# Patient Record
Sex: Male | Born: 1945
Health system: Southern US, Community
[De-identification: ages and names within clinical notes are randomized; demographics above are authoritative.]

## PROBLEM LIST (undated history)

## (undated) DIAGNOSIS — M5441 Lumbago with sciatica, right side: Secondary | ICD-10-CM

## (undated) DIAGNOSIS — Z818 Family history of other mental and behavioral disorders: Secondary | ICD-10-CM

## (undated) DIAGNOSIS — Z87442 Personal history of urinary calculi: Secondary | ICD-10-CM

## (undated) DIAGNOSIS — T884XXA Failed or difficult intubation, initial encounter: Secondary | ICD-10-CM

## (undated) DIAGNOSIS — M179 Osteoarthritis of knee, unspecified: Secondary | ICD-10-CM

## (undated) DIAGNOSIS — R011 Cardiac murmur, unspecified: Secondary | ICD-10-CM

## (undated) DIAGNOSIS — Z6835 Body mass index (BMI) 35.0-35.9, adult: Secondary | ICD-10-CM

## (undated) DIAGNOSIS — G5701 Lesion of sciatic nerve, right lower limb: Secondary | ICD-10-CM

## (undated) DIAGNOSIS — E785 Hyperlipidemia, unspecified: Secondary | ICD-10-CM

## (undated) DIAGNOSIS — I209 Angina pectoris, unspecified: Secondary | ICD-10-CM

## (undated) DIAGNOSIS — T8859XA Other complications of anesthesia, initial encounter: Secondary | ICD-10-CM

## (undated) DIAGNOSIS — T4145XA Adverse effect of unspecified anesthetic, initial encounter: Secondary | ICD-10-CM

## (undated) DIAGNOSIS — E663 Overweight: Secondary | ICD-10-CM

## (undated) DIAGNOSIS — M1990 Unspecified osteoarthritis, unspecified site: Secondary | ICD-10-CM

## (undated) DIAGNOSIS — I2510 Atherosclerotic heart disease of native coronary artery without angina pectoris: Secondary | ICD-10-CM

## (undated) DIAGNOSIS — R7301 Impaired fasting glucose: Secondary | ICD-10-CM

## (undated) HISTORY — DX: Body mass index (BMI) 35.0-35.9, adult: Z68.35

## (undated) HISTORY — DX: Hyperlipidemia, unspecified: E78.5

## (undated) HISTORY — DX: Cardiac murmur, unspecified: R01.1

## (undated) HISTORY — DX: Osteoarthritis of knee, unspecified: M17.9

## (undated) HISTORY — PX: JOINT REPLACEMENT: SHX530

## (undated) HISTORY — DX: Lesion of sciatic nerve, right lower limb: G57.01

## (undated) HISTORY — DX: Lumbago with sciatica, right side: M54.41

## (undated) HISTORY — DX: Overweight: E66.3

## (undated) HISTORY — DX: Family history of other mental and behavioral disorders: Z81.8

## (undated) HISTORY — PX: TOTAL KNEE ARTHROPLASTY: SHX125

## (undated) HISTORY — DX: Impaired fasting glucose: R73.01

---

## 2007-10-23 ENCOUNTER — Encounter: Admission: RE | Admit: 2007-10-23 | Discharge: 2007-10-23 | Payer: Self-pay | Admitting: Family Medicine

## 2008-06-05 HISTORY — PX: JOINT REPLACEMENT: SHX530

## 2010-04-06 ENCOUNTER — Inpatient Hospital Stay (HOSPITAL_COMMUNITY): Admission: RE | Admit: 2010-04-06 | Discharge: 2010-04-09 | Payer: Self-pay | Admitting: Orthopedic Surgery

## 2010-04-06 ENCOUNTER — Encounter (INDEPENDENT_AMBULATORY_CARE_PROVIDER_SITE_OTHER): Payer: Self-pay | Admitting: Orthopedic Surgery

## 2010-04-08 ENCOUNTER — Encounter (INDEPENDENT_AMBULATORY_CARE_PROVIDER_SITE_OTHER): Payer: Self-pay | Admitting: Orthopedic Surgery

## 2010-08-16 LAB — LIPID PANEL
LDL Cholesterol: 81 mg/dL (ref 0–99)
Triglycerides: 80 mg/dL (ref <150)
VLDL: 16 mg/dL (ref 0–40)

## 2010-08-16 LAB — PROTIME-INR
INR: 1.44 (ref 0.00–1.49)
INR: 2.26 — ABNORMAL HIGH (ref 0.00–1.49)
Prothrombin Time: 14.8 seconds (ref 11.6–15.2)
Prothrombin Time: 25.1 seconds — ABNORMAL HIGH (ref 11.6–15.2)

## 2010-08-16 LAB — CBC
HCT: 33.5 % — ABNORMAL LOW (ref 39.0–52.0)
HCT: 35.5 % — ABNORMAL LOW (ref 39.0–52.0)
Hemoglobin: 12 g/dL — ABNORMAL LOW (ref 13.0–17.0)
MCHC: 33.4 g/dL (ref 30.0–36.0)
MCHC: 34.2 g/dL (ref 30.0–36.0)
MCHC: 34.3 g/dL (ref 30.0–36.0)
MCV: 86.8 fL (ref 78.0–100.0)
Platelets: 251 10*3/uL (ref 150–400)
Platelets: 274 10*3/uL (ref 150–400)
RBC: 4.09 MIL/uL — ABNORMAL LOW (ref 4.22–5.81)
RDW: 12.8 % (ref 11.5–15.5)
RDW: 13 % (ref 11.5–15.5)
RDW: 13.1 % (ref 11.5–15.5)
WBC: 7 10*3/uL (ref 4.0–10.5)
WBC: 7.5 10*3/uL (ref 4.0–10.5)
WBC: 9.4 10*3/uL (ref 4.0–10.5)

## 2010-08-16 LAB — BASIC METABOLIC PANEL
BUN: 10 mg/dL (ref 6–23)
BUN: 13 mg/dL (ref 6–23)
BUN: 8 mg/dL (ref 6–23)
CO2: 28 mEq/L (ref 19–32)
Calcium: 8.3 mg/dL — ABNORMAL LOW (ref 8.4–10.5)
Chloride: 101 mEq/L (ref 96–112)
Chloride: 95 mEq/L — ABNORMAL LOW (ref 96–112)
Creatinine, Ser: 0.74 mg/dL (ref 0.4–1.5)
Creatinine, Ser: 0.8 mg/dL (ref 0.4–1.5)
GFR calc non Af Amer: >60 mL/min (ref >60)
Glucose, Bld: 137 mg/dL — ABNORMAL HIGH (ref 70–99)
Potassium: 3.6 mEq/L (ref 3.5–5.1)
Sodium: 135 mEq/L (ref 135–145)

## 2010-08-16 LAB — SYNOVIAL CELL COUNT + DIFF, W/ CRYSTALS: Monocyte-Macrophage-Synovial Fluid: 7 % — ABNORMAL LOW (ref 50–90)

## 2010-08-16 LAB — BODY FLUID CULTURE: Culture: NO GROWTH

## 2010-08-16 LAB — CARDIAC PANEL(CRET KIN+CKTOT+MB+TROPI)
CK, MB: 1.1 ng/mL (ref 0.3–4.0)
CK, MB: 2.6 ng/mL (ref 0.3–4.0)
Relative Index: 1.6 (ref 0.0–2.5)
Total CK: 163 U/L (ref 7–232)
Total CK: 43 U/L (ref 7–232)
Troponin I: <0.01 ng/mL (ref 0.00–0.06)
Troponin I: <0.01 ng/mL (ref 0.00–0.06)
Troponin I: <0.01 ng/mL (ref 0.00–0.06)
Troponin I: <0.01 ng/mL (ref 0.00–0.06)

## 2010-08-16 LAB — GRAM STAIN

## 2010-08-16 LAB — PATHOLOGIST SMEAR REVIEW

## 2010-08-17 LAB — PROTIME-INR
INR: 1.01 (ref 0.00–1.49)
Prothrombin Time: 13.5 seconds (ref 11.6–15.2)

## 2010-08-17 LAB — CBC
HCT: 42.4 % (ref 39.0–52.0)
MCH: 29.6 pg (ref 26.0–34.0)
MCHC: 34 g/dL (ref 30.0–36.0)
RDW: 13.1 % (ref 11.5–15.5)

## 2010-08-17 LAB — COMPREHENSIVE METABOLIC PANEL
ALT: 21 U/L (ref 0–53)
Calcium: 9.2 mg/dL (ref 8.4–10.5)
Creatinine, Ser: 0.81 mg/dL (ref 0.4–1.5)
Glucose, Bld: 106 mg/dL — ABNORMAL HIGH (ref 70–99)
Sodium: 137 mEq/L (ref 135–145)
Total Protein: 7.1 g/dL (ref 6.0–8.3)

## 2010-08-17 LAB — URINALYSIS, ROUTINE W REFLEX MICROSCOPIC
Bilirubin Urine: NEGATIVE
Hgb urine dipstick: NEGATIVE
Ketones, ur: NEGATIVE mg/dL
Nitrite: NEGATIVE
Protein, ur: NEGATIVE mg/dL
Urobilinogen, UA: 0.2 mg/dL (ref 0.0–1.0)

## 2010-08-17 LAB — APTT: aPTT: 28 seconds (ref 24–37)

## 2010-08-17 LAB — SURGICAL PCR SCREEN
MRSA, PCR: NEGATIVE
Staphylococcus aureus: NEGATIVE

## 2011-09-14 DIAGNOSIS — Z1211 Encounter for screening for malignant neoplasm of colon: Secondary | ICD-10-CM | POA: Diagnosis not present

## 2011-09-14 DIAGNOSIS — Z136 Encounter for screening for cardiovascular disorders: Secondary | ICD-10-CM | POA: Diagnosis not present

## 2011-09-14 DIAGNOSIS — Z23 Encounter for immunization: Secondary | ICD-10-CM | POA: Diagnosis not present

## 2011-09-14 DIAGNOSIS — Z131 Encounter for screening for diabetes mellitus: Secondary | ICD-10-CM | POA: Diagnosis not present

## 2011-09-14 DIAGNOSIS — Z0000 Encounter for general adult medical examination without abnormal findings: Secondary | ICD-10-CM | POA: Diagnosis not present

## 2011-10-24 DIAGNOSIS — Z1211 Encounter for screening for malignant neoplasm of colon: Secondary | ICD-10-CM | POA: Diagnosis not present

## 2011-10-24 DIAGNOSIS — K648 Other hemorrhoids: Secondary | ICD-10-CM | POA: Diagnosis not present

## 2011-11-08 DIAGNOSIS — K648 Other hemorrhoids: Secondary | ICD-10-CM | POA: Diagnosis not present

## 2012-02-23 DIAGNOSIS — M6530 Trigger finger, unspecified finger: Secondary | ICD-10-CM | POA: Diagnosis not present

## 2012-02-23 DIAGNOSIS — M19049 Primary osteoarthritis, unspecified hand: Secondary | ICD-10-CM | POA: Diagnosis not present

## 2012-03-19 DIAGNOSIS — L821 Other seborrheic keratosis: Secondary | ICD-10-CM | POA: Diagnosis not present

## 2012-03-19 DIAGNOSIS — D239 Other benign neoplasm of skin, unspecified: Secondary | ICD-10-CM | POA: Diagnosis not present

## 2012-03-25 DIAGNOSIS — Z23 Encounter for immunization: Secondary | ICD-10-CM | POA: Diagnosis not present

## 2012-04-16 DIAGNOSIS — H2510 Age-related nuclear cataract, unspecified eye: Secondary | ICD-10-CM | POA: Diagnosis not present

## 2012-10-22 DIAGNOSIS — Z0000 Encounter for general adult medical examination without abnormal findings: Secondary | ICD-10-CM | POA: Diagnosis not present

## 2012-10-22 DIAGNOSIS — E7800 Pure hypercholesterolemia, unspecified: Secondary | ICD-10-CM | POA: Diagnosis not present

## 2012-10-22 DIAGNOSIS — M79609 Pain in unspecified limb: Secondary | ICD-10-CM | POA: Diagnosis not present

## 2012-10-22 DIAGNOSIS — R7301 Impaired fasting glucose: Secondary | ICD-10-CM | POA: Diagnosis not present

## 2012-11-01 DIAGNOSIS — M25559 Pain in unspecified hip: Secondary | ICD-10-CM | POA: Diagnosis not present

## 2012-11-01 DIAGNOSIS — M1710 Unilateral primary osteoarthritis, unspecified knee: Secondary | ICD-10-CM | POA: Diagnosis not present

## 2012-11-01 DIAGNOSIS — M545 Low back pain: Secondary | ICD-10-CM | POA: Diagnosis not present

## 2012-11-21 DIAGNOSIS — M76899 Other specified enthesopathies of unspecified lower limb, excluding foot: Secondary | ICD-10-CM | POA: Diagnosis not present

## 2012-11-21 DIAGNOSIS — M5137 Other intervertebral disc degeneration, lumbosacral region: Secondary | ICD-10-CM | POA: Diagnosis not present

## 2012-12-10 DIAGNOSIS — M76899 Other specified enthesopathies of unspecified lower limb, excluding foot: Secondary | ICD-10-CM | POA: Diagnosis not present

## 2013-02-18 DIAGNOSIS — Z23 Encounter for immunization: Secondary | ICD-10-CM | POA: Diagnosis not present

## 2013-02-21 DIAGNOSIS — Z01810 Encounter for preprocedural cardiovascular examination: Secondary | ICD-10-CM | POA: Diagnosis not present

## 2013-02-21 DIAGNOSIS — Z0389 Encounter for observation for other suspected diseases and conditions ruled out: Secondary | ICD-10-CM | POA: Diagnosis not present

## 2013-02-21 DIAGNOSIS — R9431 Abnormal electrocardiogram [ECG] [EKG]: Secondary | ICD-10-CM | POA: Diagnosis not present

## 2013-02-21 DIAGNOSIS — E663 Overweight: Secondary | ICD-10-CM | POA: Diagnosis not present

## 2013-02-21 DIAGNOSIS — M25569 Pain in unspecified knee: Secondary | ICD-10-CM | POA: Diagnosis not present

## 2013-03-04 ENCOUNTER — Other Ambulatory Visit: Payer: Self-pay | Admitting: Orthopedic Surgery

## 2013-03-04 DIAGNOSIS — M1710 Unilateral primary osteoarthritis, unspecified knee: Secondary | ICD-10-CM | POA: Diagnosis not present

## 2013-03-06 ENCOUNTER — Encounter (HOSPITAL_COMMUNITY): Payer: Self-pay | Admitting: Pharmacy Technician

## 2013-03-10 ENCOUNTER — Encounter (HOSPITAL_COMMUNITY): Payer: Self-pay

## 2013-03-10 ENCOUNTER — Encounter (HOSPITAL_COMMUNITY)
Admission: RE | Admit: 2013-03-10 | Discharge: 2013-03-10 | Disposition: A | Discharge status: Home / Self Care | Payer: Medicare Other | Source: Ambulatory Visit | Attending: Orthopedic Surgery | Admitting: Orthopedic Surgery

## 2013-03-10 DIAGNOSIS — R079 Chest pain, unspecified: Secondary | ICD-10-CM | POA: Diagnosis not present

## 2013-03-10 DIAGNOSIS — Z01812 Encounter for preprocedural laboratory examination: Secondary | ICD-10-CM | POA: Insufficient documentation

## 2013-03-10 DIAGNOSIS — Z01818 Encounter for other preprocedural examination: Secondary | ICD-10-CM | POA: Diagnosis not present

## 2013-03-10 HISTORY — DX: Failed or difficult intubation, initial encounter: T88.4XXA

## 2013-03-10 HISTORY — DX: Other complications of anesthesia, initial encounter: T88.59XA

## 2013-03-10 HISTORY — DX: Unspecified osteoarthritis, unspecified site: M19.90

## 2013-03-10 HISTORY — DX: Adverse effect of unspecified anesthetic, initial encounter: T41.45XA

## 2013-03-10 LAB — URINALYSIS, ROUTINE W REFLEX MICROSCOPIC
Glucose, UA: NEGATIVE mg/dL
Hgb urine dipstick: NEGATIVE
Ketones, ur: NEGATIVE mg/dL
Specific Gravity, Urine: 1.014 (ref 1.005–1.030)
Urobilinogen, UA: 0.2 mg/dL (ref 0.0–1.0)

## 2013-03-10 LAB — COMPREHENSIVE METABOLIC PANEL
ALT: 23 U/L (ref 0–53)
Albumin: 4.3 g/dL (ref 3.5–5.2)
Alkaline Phosphatase: 87 U/L (ref 39–117)
CO2: 26 mEq/L (ref 19–32)
Calcium: 9.5 mg/dL (ref 8.4–10.5)
Creatinine, Ser: 0.76 mg/dL (ref 0.50–1.35)
GFR calc Af Amer: >90 mL/min (ref >90)
GFR calc non Af Amer: >90 mL/min (ref >90)
Glucose, Bld: 110 mg/dL — ABNORMAL HIGH (ref 70–99)
Sodium: 135 mEq/L (ref 135–145)

## 2013-03-10 LAB — CBC WITH DIFFERENTIAL/PLATELET
Eosinophils Relative: 2 % (ref 0–5)
HCT: 42.4 % (ref 39.0–52.0)
Hemoglobin: 15.2 g/dL (ref 13.0–17.0)
Lymphocytes Relative: 33 % (ref 12–46)
Lymphs Abs: 2.2 10*3/uL (ref 0.7–4.0)
MCV: 86.5 fL (ref 78.0–100.0)
Monocytes Absolute: 0.5 10*3/uL (ref 0.1–1.0)
Monocytes Relative: 8 % (ref 3–12)
Neutro Abs: 3.8 10*3/uL (ref 1.7–7.7)
Platelets: 272 10*3/uL (ref 150–400)
RBC: 4.9 MIL/uL (ref 4.22–5.81)
RDW: 13.5 % (ref 11.5–15.5)
WBC: 6.7 10*3/uL (ref 4.0–10.5)

## 2013-03-10 LAB — PROTIME-INR
INR: 1 (ref 0.00–1.49)
Prothrombin Time: 13 seconds (ref 11.6–15.2)

## 2013-03-10 LAB — TYPE AND SCREEN
ABO/RH(D): O POS
Antibody Screen: NEGATIVE

## 2013-03-10 NOTE — Progress Notes (Signed)
Call to [facility]. (Med. Records) For EKG & OV note , left voicemail.  Pt. Reports that he was seen by Dr. Anne Fu a week or so ago,  EKG was done at the same time.

## 2013-03-10 NOTE — Progress Notes (Signed)
Call to A. Zelenak,PAC, reported pt. History of difficult airway. She will review the chart after reading previous anesth. report

## 2013-03-10 NOTE — Pre-Procedure Instructions (Signed)
Isaac [name]  03/10/2013   Your procedure is scheduled on:  03/19/2013  Report to [facility]  2 * 3   ENTRANCE A   at 8:10 AM.  Call this number if you have problems the morning of surgery: [phone]   Remember:   Do not eat food or drink liquids after midnight. On Tuesday   Take these medicines the morning of surgery with A SIP OF WATER: nothing    Do not wear jewelry  Do not wear lotions, powders, or perfumes. You may wear deodorant.   Men may shave face and neck.  Do not bring valuables to the hospital.  [facility] is not responsible                  for any belongings or valuables.               Contacts, dentures or bridgework may not be worn into surgery.  Leave suitcase in the car. After surgery it may be brought to your room.  For patients admitted to the hospital, discharge time is determined by your                treatment team.               Patients discharged the day of surgery will not be allowed to drive  home.  Name and phone number of your driver:/w family  Special Instructions: Shower using CHG 2 nights before surgery and the night before surgery.  If you shower the day of surgery use CHG.  Use special wash - you have one bottle of CHG for all showers.  You should use approximately 1/3 of the bottle for each shower.   Please read over the following fact sheets that you were given: Pain Booklet, Coughing and Deep Breathing, Blood Transfusion Information, MRSA Information and Surgical Site Infection Prevention

## 2013-03-11 ENCOUNTER — Encounter (HOSPITAL_COMMUNITY): Payer: Self-pay

## 2013-03-11 LAB — URINE CULTURE
Colony Count: NO GROWTH
Culture: NO GROWTH

## 2013-03-11 NOTE — Progress Notes (Signed)
Anesthesia Chart Review:  Patient is a 67 year old male scheduled for right TKR on 03/19/13 by Dr. Eulah Pont.  History includes obesity, former smoker, arthritis.  PCP is Dr. Clifton Custard Marrow.  Anesthesia history: DIFFICULT INTUBATION and HTN, tachycardia with ST depression and post-operative negative cardiac work-up 04/2010.  Anesthesia records obtained from 04/06/10 which indicated that there was poor visualization with a Miller 2 and glidescope was ultimately used to place a 7.5 ETT.  He was evaluated by cardiologist Dr. Donato Schultz for a preoperative evaluation on 02/21/13.  EKG then showed NSR, non-specific QRS widening. Dr. Anne Fu had initially seen patient in 04/2010 following his left TKA, as patient had developed significant ST depression in precordial leads, ST @ 136 bpm, and HTN (160/100) during that procedure.  Significant EKG changes resolved after approximately one hour.  He ultimately had a stress test on 05/31/10 that was "overall low risk with no evidence of ischemia with normal ejection fraction.  He also had an abdominal aortic screening on 09/15/11 which showed no evidence of abdominal aortic aneurysm."  Ultimately, Dr. Anne Fu felt patient could proceed with this procedure with low overall cardiac risk.  No further cardiac testing was recommended preoperatively.  Echo on 04/08/10 showed: Left ventricle: The cavity size was normal. Systolic function was vigorous. The estimated ejection fraction was in the range of 65% to 70%. Although no diagnostic regional wall motion abnormality was identified, this possibility cannot be completely excluded on the basis of this study. Left ventricular diastolic function parameters were normal.   CXR on 03/10/13 showed no active cardiopulmonary disease.  Preoperative labs noted.  If no acute changes then I anticipate that he can proceed as planned.  Velna Ochs [facility]/Anesthesiology Phone [phone] 03/11/2013 3:05 PM

## 2013-03-12 ENCOUNTER — Other Ambulatory Visit: Payer: Self-pay | Admitting: Orthopedic Surgery

## 2013-03-12 NOTE — H&P (Signed)
TOTAL KNEE ADMISSION H&P  Patient is being admitted for right total knee arthroplasty.  Subjective:  Chief Complaint:right knee pain.  HPI: Isaac ACCOMANDO, 67 y.o. male, has a history of pain and functional disability in the right knee due to arthritis and has failed non-surgical conservative treatments for greater than 12 weeks to includeNSAID's and/or analgesics, corticosteriod injections, flexibility and strengthening excercises, use of assistive devices and activity modification.  Onset of symptoms was gradual, starting >10 years ago with gradually worsening course since that time. The patient noted no past surgery on the right knee(s).  Patient currently rates pain in the right knee(s) at 10 out of 10 with activity. Patient has night pain, worsening of pain with activity and weight bearing, pain that interferes with activities of daily living, crepitus and joint swelling.  Patient has evidence of subchondral cysts, subchondral sclerosis, periarticular osteophytes and joint space narrowing by imaging studies. There is no active infection.  There are no active problems to display for this patient.  Past Medical History  Diagnosis Date  . Arthritis     R knee- DJD  . Complication of anesthesia     tachycardia with ST depression, HTN witth left TKA 04/2010 (neg cardiac w/u)  . Difficult intubation     glidescope used 04/06/10    Past Surgical History  Procedure Laterality Date  . Joint replacement      L TKA     (Not in a hospital admission) No Known Allergies  History  Substance Use Topics  . Smoking status: Former Smoker    Quit date: 03/10/1998  . Smokeless tobacco: Not on file  . Alcohol Use: Yes     Comment: 3 beers per week    No family history on file.   Current outpatient prescriptions:cholecalciferol (VITAMIN D) 1000 UNITS tablet, Take 1,000 Units by mouth daily., Disp: , Rfl: ;  Docusate Calcium (STOOL SOFTENER PO), Take 1 capsule by mouth daily., Disp: , Rfl: ;   Multiple Vitamin (MULTIVITAMIN WITH MINERALS) TABS tablet, Take 1 tablet by mouth daily., Disp: , Rfl: ;  VITAMIN E PO, Take 1 capsule by mouth daily., Disp: , Rfl:   Review of Systems  Musculoskeletal: Positive for joint pain.  All other systems reviewed and are negative.    Objective:  Physical Exam  Constitutional: He is oriented to person, place, and time. He appears well-developed and well-nourished.  HENT:  Head: Normocephalic and atraumatic.  Eyes: Conjunctivae and EOM are normal. Pupils are equal, round, and reactive to light.  Neck: Neck supple.  Cardiovascular: Normal rate, regular rhythm and normal heart sounds.   Respiratory: Effort normal and breath sounds normal.  GI: Soft. Bowel sounds are normal.  Musculoskeletal:  Examination of his right knee reveals marked grating and crepitus.  5 degrees of varus.  Mild flexion contracture.  Flexion maybe 90 degrees.  Neurovascularly intact distally.    Neurological: He is alert and oriented to person, place, and time.  Skin: Skin is warm and dry.  Psychiatric: He has a normal mood and affect. His behavior is normal.    Vital signs in last 24 hours: Temp: 98.3.  Blood pressure: 134/81.  Pulse: 74.  Labs:   Estimated body mass index is 34.22 kg/(m^2) as calculated from the following:   Height as of 03/10/13: 5\' 8"  (1.727 m).   Weight as of 03/10/13: 102.059 kg (225 lb).   Imaging Review Plain radiographs demonstrate severe degenerative joint disease of the right knee(s). The bone quality  appears to be good for age and reported activity level.  Assessment/Plan:  End stage arthritis, right knee   The patient history, physical examination, clinical judgment of the provider and imaging studies are consistent with end stage degenerative joint disease of the right knee(s) and total knee arthroplasty is deemed medically necessary. The treatment options including medical management, injection therapy arthroscopy and arthroplasty  were discussed at length. The risks and benefits of total knee arthroplasty were presented and reviewed. The risks due to aseptic loosening, infection, stiffness, patella tracking problems, thromboembolic complications and other imponderables were discussed. The patient acknowledged the explanation, agreed to proceed with the plan and consent was signed. Patient is being admitted for inpatient treatment for surgery, pain control, PT, OT, prophylactic antibiotics, VTE prophylaxis, progressive ambulation and ADL's and discharge planning. The patient is planning to be discharged home with home health services

## 2013-03-18 MED ORDER — CEFAZOLIN SODIUM-DEXTROSE 2-3 GM-% IV SOLR
2.0000 g | INTRAVENOUS | Status: AC
  Administered 2013-03-19: 2 g via INTRAVENOUS
  Filled 2013-03-18: qty 50

## 2013-03-18 NOTE — Progress Notes (Signed)
Called, left message on cell phone and spoke with daughter and patient regarding time change, understanding verbalized. Pt was instructed to be here at 0700 on 03/19/13.

## 2013-03-19 ENCOUNTER — Inpatient Hospital Stay (HOSPITAL_COMMUNITY): Payer: Medicare Other | Admitting: Anesthesiology

## 2013-03-19 ENCOUNTER — Inpatient Hospital Stay (HOSPITAL_COMMUNITY): Payer: Medicare Other

## 2013-03-19 ENCOUNTER — Encounter (HOSPITAL_COMMUNITY): Payer: Self-pay | Admitting: Anesthesiology

## 2013-03-19 ENCOUNTER — Encounter (HOSPITAL_COMMUNITY)
Admission: RE | Disposition: A | Discharge status: Home Health | Payer: Self-pay | Source: Ambulatory Visit | Attending: Orthopedic Surgery

## 2013-03-19 ENCOUNTER — Inpatient Hospital Stay (HOSPITAL_COMMUNITY)
Admission: RE | Admit: 2013-03-19 | Discharge: 2013-03-21 | DRG: 470 | Disposition: A | Discharge status: Home Health | Payer: Medicare Other | Source: Ambulatory Visit | Attending: Orthopedic Surgery | Admitting: Orthopedic Surgery

## 2013-03-19 ENCOUNTER — Encounter (HOSPITAL_COMMUNITY): Payer: Medicare Other | Admitting: Vascular Surgery

## 2013-03-19 DIAGNOSIS — G8918 Other acute postprocedural pain: Secondary | ICD-10-CM | POA: Diagnosis not present

## 2013-03-19 DIAGNOSIS — Z96659 Presence of unspecified artificial knee joint: Secondary | ICD-10-CM

## 2013-03-19 DIAGNOSIS — M1711 Unilateral primary osteoarthritis, right knee: Secondary | ICD-10-CM

## 2013-03-19 DIAGNOSIS — M25569 Pain in unspecified knee: Secondary | ICD-10-CM | POA: Diagnosis not present

## 2013-03-19 DIAGNOSIS — Z87891 Personal history of nicotine dependence: Secondary | ICD-10-CM

## 2013-03-19 DIAGNOSIS — Z7982 Long term (current) use of aspirin: Secondary | ICD-10-CM

## 2013-03-19 DIAGNOSIS — M1710 Unilateral primary osteoarthritis, unspecified knee: Secondary | ICD-10-CM | POA: Diagnosis not present

## 2013-03-19 DIAGNOSIS — Z471 Aftercare following joint replacement surgery: Secondary | ICD-10-CM | POA: Diagnosis not present

## 2013-03-19 DIAGNOSIS — Z79899 Other long term (current) drug therapy: Secondary | ICD-10-CM

## 2013-03-19 HISTORY — PX: TOTAL KNEE ARTHROPLASTY: SHX125

## 2013-03-19 SURGERY — ARTHROPLASTY, KNEE, TOTAL
Anesthesia: General | Site: Knee | Laterality: Right | Wound class: Clean

## 2013-03-19 MED ORDER — SODIUM CHLORIDE 0.9 % IJ SOLN
INTRAMUSCULAR | Status: DC | PRN
  Administered 2013-03-19: 40 mL via INTRAVENOUS

## 2013-03-19 MED ORDER — ONDANSETRON HCL 4 MG/2ML IJ SOLN: 4.0000 mg | Freq: Four times a day (QID) | INTRAMUSCULAR | Status: DC | PRN

## 2013-03-19 MED ORDER — BUPIVACAINE LIPOSOME 1.3 % IJ SUSP
20.0000 mL | Freq: Once | INTRAMUSCULAR | Status: AC
  Administered 2013-03-19: 20 mL
  Filled 2013-03-19: qty 20

## 2013-03-19 MED ORDER — METOCLOPRAMIDE HCL 5 MG/ML IJ SOLN: 5.0000 mg | Freq: Three times a day (TID) | INTRAMUSCULAR | Status: DC | PRN

## 2013-03-19 MED ORDER — POTASSIUM CHLORIDE IN NACL 20-0.9 MEQ/L-% IV SOLN
INTRAVENOUS | Status: DC
  Administered 2013-03-19 – 2013-03-20 (×2): via INTRAVENOUS
  Filled 2013-03-19 (×7): qty 1000

## 2013-03-19 MED ORDER — DIPHENHYDRAMINE HCL 12.5 MG/5ML PO ELIX: 12.5000 mg | ORAL_SOLUTION | ORAL | Status: DC | PRN

## 2013-03-19 MED ORDER — HYDROMORPHONE HCL PF 1 MG/ML IJ SOLN
INTRAMUSCULAR | Status: AC
  Filled 2013-03-19: qty 1

## 2013-03-19 MED ORDER — BUPIVACAINE HCL (PF) 0.25 % IJ SOLN
INTRAMUSCULAR | Status: DC | PRN
  Administered 2013-03-19: 15 mL

## 2013-03-19 MED ORDER — METOCLOPRAMIDE HCL 5 MG PO TABS
5.0000 mg | ORAL_TABLET | Freq: Three times a day (TID) | ORAL | Status: DC | PRN
  Filled 2013-03-19: qty 2

## 2013-03-19 MED ORDER — ROCURONIUM BROMIDE 100 MG/10ML IV SOLN
INTRAVENOUS | Status: DC | PRN
  Administered 2013-03-19: 50 mg via INTRAVENOUS

## 2013-03-19 MED ORDER — CHLORHEXIDINE GLUCONATE 4 % EX LIQD: 60.0000 mL | Freq: Once | CUTANEOUS | Status: DC

## 2013-03-19 MED ORDER — METHOCARBAMOL 100 MG/ML IJ SOLN: 500.0000 mg | Freq: Four times a day (QID) | INTRAVENOUS | Status: DC | PRN

## 2013-03-19 MED ORDER — ONDANSETRON HCL 4 MG/2ML IJ SOLN: 4.0000 mg | Freq: Once | INTRAMUSCULAR | Status: DC | PRN

## 2013-03-19 MED ORDER — ACETAMINOPHEN 650 MG RE SUPP: 650.0000 mg | Freq: Four times a day (QID) | RECTAL | Status: DC | PRN

## 2013-03-19 MED ORDER — ONDANSETRON HCL 4 MG PO TABS: 4.0000 mg | ORAL_TABLET | Freq: Four times a day (QID) | ORAL | Status: DC | PRN

## 2013-03-19 MED ORDER — BUPIVACAINE-EPINEPHRINE PF 0.5-1:200000 % IJ SOLN
INTRAMUSCULAR | Status: DC | PRN
  Administered 2013-03-19: 30 mL

## 2013-03-19 MED ORDER — OXYCODONE HCL 5 MG PO TABS
5.0000 mg | ORAL_TABLET | Freq: Once | ORAL | Status: AC | PRN
  Administered 2013-03-19: 5 mg via ORAL

## 2013-03-19 MED ORDER — SUCCINYLCHOLINE CHLORIDE 20 MG/ML IJ SOLN
INTRAMUSCULAR | Status: DC | PRN
  Administered 2013-03-19: 120 mg via INTRAVENOUS

## 2013-03-19 MED ORDER — BUPIVACAINE HCL (PF) 0.25 % IJ SOLN
INTRAMUSCULAR | Status: AC
  Filled 2013-03-19: qty 30

## 2013-03-19 MED ORDER — HYDROMORPHONE HCL PF 1 MG/ML IJ SOLN
INTRAMUSCULAR | Status: AC
  Administered 2013-03-19: 1 mg
  Filled 2013-03-19: qty 1

## 2013-03-19 MED ORDER — LACTATED RINGERS IV SOLN
INTRAVENOUS | Status: DC
  Administered 2013-03-19: 08:00:00 via INTRAVENOUS

## 2013-03-19 MED ORDER — PHENOL 1.4 % MT LIQD
1.0000 | OROMUCOSAL | Status: DC | PRN
  Filled 2013-03-19: qty 177

## 2013-03-19 MED ORDER — ZOLPIDEM TARTRATE 5 MG PO TABS: 5.0000 mg | ORAL_TABLET | Freq: Every evening | ORAL | Status: DC | PRN

## 2013-03-19 MED ORDER — SODIUM CHLORIDE 0.9 % IR SOLN
Status: DC | PRN
  Administered 2013-03-19: 1000 mL

## 2013-03-19 MED ORDER — PROPOFOL 10 MG/ML IV BOLUS
INTRAVENOUS | Status: DC | PRN
  Administered 2013-03-19: 150 mg via INTRAVENOUS

## 2013-03-19 MED ORDER — HYDROMORPHONE HCL PF 1 MG/ML IJ SOLN
0.5000 mg | INTRAMUSCULAR | Status: DC | PRN
  Administered 2013-03-19: 1 mg via INTRAVENOUS
  Administered 2013-03-20: 0.5 mg via INTRAVENOUS
  Administered 2013-03-20 (×2): 1 mg via INTRAVENOUS
  Filled 2013-03-19 (×4): qty 1

## 2013-03-19 MED ORDER — LIDOCAINE HCL (CARDIAC) 20 MG/ML IV SOLN
INTRAVENOUS | Status: DC | PRN
  Administered 2013-03-19: 100 mg via INTRAVENOUS

## 2013-03-19 MED ORDER — ASPIRIN EC 325 MG PO TBEC
325.0000 mg | DELAYED_RELEASE_TABLET | Freq: Every day | ORAL | Status: DC
  Administered 2013-03-20 – 2013-03-21 (×2): 325 mg via ORAL
  Filled 2013-03-19 (×3): qty 1

## 2013-03-19 MED ORDER — HYDROMORPHONE HCL PF 1 MG/ML IJ SOLN
0.2500 mg | INTRAMUSCULAR | Status: DC | PRN
  Administered 2013-03-19 (×4): 0.5 mg via INTRAVENOUS

## 2013-03-19 MED ORDER — MIDAZOLAM HCL 5 MG/5ML IJ SOLN
INTRAMUSCULAR | Status: DC | PRN
  Administered 2013-03-19: 2 mg via INTRAVENOUS

## 2013-03-19 MED ORDER — ARTIFICIAL TEARS OP OINT
TOPICAL_OINTMENT | OPHTHALMIC | Status: DC | PRN
  Administered 2013-03-19: 1 via OPHTHALMIC

## 2013-03-19 MED ORDER — FENTANYL CITRATE 0.05 MG/ML IJ SOLN
INTRAMUSCULAR | Status: DC | PRN
  Administered 2013-03-19 (×8): 50 ug via INTRAVENOUS

## 2013-03-19 MED ORDER — ADULT MULTIVITAMIN W/MINERALS CH
1.0000 | ORAL_TABLET | Freq: Every day | ORAL | Status: DC
  Administered 2013-03-19 – 2013-03-21 (×3): 1 via ORAL
  Filled 2013-03-19 (×3): qty 1

## 2013-03-19 MED ORDER — FENTANYL CITRATE 0.05 MG/ML IJ SOLN
INTRAMUSCULAR | Status: AC
  Administered 2013-03-19: 100 ug
  Filled 2013-03-19: qty 2

## 2013-03-19 MED ORDER — OXYCODONE HCL 5 MG/5ML PO SOLN: 5.0000 mg | Freq: Once | ORAL | Status: AC | PRN

## 2013-03-19 MED ORDER — OXYCODONE HCL 5 MG PO TABS
5.0000 mg | ORAL_TABLET | ORAL | Status: DC | PRN
  Administered 2013-03-19 – 2013-03-21 (×10): 10 mg via ORAL
  Filled 2013-03-19 (×11): qty 2

## 2013-03-19 MED ORDER — ACETAMINOPHEN 325 MG PO TABS: 650.0000 mg | ORAL_TABLET | Freq: Four times a day (QID) | ORAL | Status: DC | PRN

## 2013-03-19 MED ORDER — METHOCARBAMOL 500 MG PO TABS
ORAL_TABLET | ORAL | Status: AC
  Filled 2013-03-19: qty 1

## 2013-03-19 MED ORDER — DOCUSATE SODIUM 100 MG PO CAPS
100.0000 mg | ORAL_CAPSULE | Freq: Two times a day (BID) | ORAL | Status: DC
  Administered 2013-03-19 – 2013-03-21 (×5): 100 mg via ORAL
  Filled 2013-03-19 (×6): qty 1

## 2013-03-19 MED ORDER — MENTHOL 3 MG MT LOZG
1.0000 | LOZENGE | OROMUCOSAL | Status: DC | PRN
  Filled 2013-03-19: qty 9

## 2013-03-19 MED ORDER — METHOCARBAMOL 500 MG PO TABS
500.0000 mg | ORAL_TABLET | Freq: Four times a day (QID) | ORAL | Status: DC | PRN
  Administered 2013-03-19 – 2013-03-21 (×6): 500 mg via ORAL
  Filled 2013-03-19 (×6): qty 1

## 2013-03-19 MED ORDER — MIDAZOLAM HCL 2 MG/2ML IJ SOLN
INTRAMUSCULAR | Status: AC
  Administered 2013-03-19: 2 mg
  Filled 2013-03-19: qty 2

## 2013-03-19 MED ORDER — LACTATED RINGERS IV SOLN: INTRAVENOUS | Status: DC

## 2013-03-19 MED ORDER — CEFAZOLIN SODIUM-DEXTROSE 2-3 GM-% IV SOLR
2.0000 g | Freq: Four times a day (QID) | INTRAVENOUS | Status: AC
  Administered 2013-03-19 (×2): 2 g via INTRAVENOUS
  Filled 2013-03-19 (×2): qty 50

## 2013-03-19 MED ORDER — VITAMIN D3 25 MCG (1000 UNIT) PO TABS
1000.0000 [IU] | ORAL_TABLET | Freq: Every day | ORAL | Status: DC
  Administered 2013-03-19 – 2013-03-21 (×3): 1000 [IU] via ORAL
  Filled 2013-03-19 (×3): qty 1

## 2013-03-19 MED ORDER — LACTATED RINGERS IV SOLN
INTRAVENOUS | Status: DC | PRN
  Administered 2013-03-19 (×3): via INTRAVENOUS

## 2013-03-19 MED ORDER — CELECOXIB 200 MG PO CAPS
200.0000 mg | ORAL_CAPSULE | Freq: Two times a day (BID) | ORAL | Status: DC
  Administered 2013-03-19 – 2013-03-21 (×5): 200 mg via ORAL
  Filled 2013-03-19 (×7): qty 1

## 2013-03-19 MED ORDER — OXYCODONE HCL 5 MG PO TABS
ORAL_TABLET | ORAL | Status: AC
  Filled 2013-03-19: qty 1

## 2013-03-19 MED ORDER — MEPERIDINE HCL 25 MG/ML IJ SOLN: 6.2500 mg | INTRAMUSCULAR | Status: DC | PRN

## 2013-03-19 SURGICAL SUPPLY — 65 items
BANDAGE ESMARK 6X9 LF (GAUZE/BANDAGES/DRESSINGS) ×1 IMPLANT
BENZOIN TINCTURE PRP APPL 2/3 (GAUZE/BANDAGES/DRESSINGS) ×2 IMPLANT
BLADE SAG 18X100X1.27 (BLADE) ×4 IMPLANT
BNDG ESMARK 6X9 LF (GAUZE/BANDAGES/DRESSINGS) ×2
BOWL SMART MIX CTS (DISPOSABLE) ×2 IMPLANT
CEMENT BONE SIMPLEX SPEEDSET (Cement) ×4 IMPLANT
CLOTH BEACON ORANGE TIMEOUT ST (SAFETY) ×2 IMPLANT
COVER SURGICAL LIGHT HANDLE (MISCELLANEOUS) ×2 IMPLANT
CUFF TOURNIQUET SINGLE 34IN LL (TOURNIQUET CUFF) ×2 IMPLANT
DRAPE EXTREMITY T 121X128X90 (DRAPE) ×2 IMPLANT
DRAPE PROXIMA HALF (DRAPES) ×2 IMPLANT
DRAPE U-SHAPE 47X51 STRL (DRAPES) ×2 IMPLANT
DRSG PAD ABDOMINAL 8X10 ST (GAUZE/BANDAGES/DRESSINGS) ×2 IMPLANT
DURAPREP 26ML APPLICATOR (WOUND CARE) ×4 IMPLANT
ELECT CAUTERY BLADE 6.4 (BLADE) ×2 IMPLANT
ELECT REM PT RETURN 9FT ADLT (ELECTROSURGICAL) ×2
ELECTRODE REM PT RTRN 9FT ADLT (ELECTROSURGICAL) ×1 IMPLANT
EVACUATOR 1/8 PVC DRAIN (DRAIN) IMPLANT
FACESHIELD LNG OPTICON STERILE (SAFETY) ×4 IMPLANT
GAUZE XEROFORM 5X9 LF (GAUZE/BANDAGES/DRESSINGS) ×2 IMPLANT
GLOVE BIO SURGEON STRL SZ8 (GLOVE) ×2 IMPLANT
GLOVE BIOGEL PI IND STRL 7.0 (GLOVE) ×5 IMPLANT
GLOVE BIOGEL PI IND STRL 7.5 (GLOVE) ×1 IMPLANT
GLOVE BIOGEL PI INDICATOR 7.0 (GLOVE) ×5
GLOVE BIOGEL PI INDICATOR 7.5 (GLOVE) ×1
GLOVE ECLIPSE 6.5 STRL STRAW (GLOVE) ×4 IMPLANT
GLOVE ORTHO TXT STRL SZ7.5 (GLOVE) ×4 IMPLANT
GLOVE SURG SS PI 7.0 STRL IVOR (GLOVE) ×2 IMPLANT
GOWN PREVENTION PLUS XLARGE (GOWN DISPOSABLE) ×4 IMPLANT
GOWN PREVENTION PLUS XXLARGE (GOWN DISPOSABLE) ×2 IMPLANT
GOWN STRL NON-REIN LRG LVL3 (GOWN DISPOSABLE) ×6 IMPLANT
HANDPIECE INTERPULSE COAX TIP (DISPOSABLE) ×1
IMMOBILIZER KNEE 22 UNIV (SOFTGOODS) IMPLANT
IMMOBILIZER KNEE 24 THIGH 36 (MISCELLANEOUS) ×1 IMPLANT
IMMOBILIZER KNEE 24 UNIV (MISCELLANEOUS) ×2
KIT BASIN OR (CUSTOM PROCEDURE TRAY) ×2 IMPLANT
KIT ROOM TURNOVER OR (KITS) ×2 IMPLANT
KNEE/VIT E POLY LINER LEVEL 1B ×2 IMPLANT
MANIFOLD NEPTUNE II (INSTRUMENTS) ×2 IMPLANT
NEEDLE 18GX1X1/2 (RX/OR ONLY) (NEEDLE) ×2 IMPLANT
NEEDLE HYPO 25GX1X1/2 BEV (NEEDLE) ×2 IMPLANT
NS IRRIG 1000ML POUR BTL (IV SOLUTION) ×2 IMPLANT
PACK TOTAL JOINT (CUSTOM PROCEDURE TRAY) ×2 IMPLANT
PAD ARMBOARD 7.5X6 YLW CONV (MISCELLANEOUS) ×4 IMPLANT
PAD CAST 4YDX4 CTTN HI CHSV (CAST SUPPLIES) ×1 IMPLANT
PADDING CAST COTTON 4X4 STRL (CAST SUPPLIES) ×1
PADDING CAST COTTON 6X4 STRL (CAST SUPPLIES) ×2 IMPLANT
SET HNDPC FAN SPRY TIP SCT (DISPOSABLE) ×1 IMPLANT
SPONGE GAUZE 4X4 12PLY (GAUZE/BANDAGES/DRESSINGS) ×2 IMPLANT
STAPLER VISISTAT 35W (STAPLE) IMPLANT
STRIP CLOSURE SKIN 1/2X4 (GAUZE/BANDAGES/DRESSINGS) ×2 IMPLANT
SUCTION FRAZIER TIP 10 FR DISP (SUCTIONS) ×2 IMPLANT
SUT VIC AB 0 CTX 18 (SUTURE) ×2 IMPLANT
SUT VIC AB 1 CTX 36 (SUTURE) ×1
SUT VIC AB 1 CTX36XBRD ANBCTR (SUTURE) ×1 IMPLANT
SUT VIC AB 2-0 CT1 27 (SUTURE) ×1
SUT VIC AB 2-0 CT1 TAPERPNT 27 (SUTURE) ×1 IMPLANT
SUT VIC AB 3-0 PS1 18 (SUTURE) ×1
SUT VIC AB 3-0 PS1 18XBRD (SUTURE) ×1 IMPLANT
SUT VLOC 180 0 24IN GS25 (SUTURE) ×2 IMPLANT
SYR CONTROL 10ML LL (SYRINGE) ×2 IMPLANT
TOWEL OR 17X24 6PK STRL BLUE (TOWEL DISPOSABLE) ×2 IMPLANT
TOWEL OR 17X26 10 PK STRL BLUE (TOWEL DISPOSABLE) ×2 IMPLANT
TRAY FOLEY CATH 16FRSI W/METER (SET/KITS/TRAYS/PACK) ×2 IMPLANT
WATER STERILE IRR 1000ML POUR (IV SOLUTION) ×4 IMPLANT

## 2013-03-19 NOTE — Interval H&P Note (Signed)
History and Physical Interval Note:  03/19/2013 8:37 AM  Isaac [name]  has presented today for surgery, with the diagnosis of OA RIGHT KNEE  The various methods of treatment have been discussed with the patient and family. After consideration of risks, benefits and other options for treatment, the patient has consented to  Procedure(s): TOTAL KNEE ARTHROPLASTY (Right) as a surgical intervention .  The patient's history has been reviewed, patient examined, no change in status, stable for surgery.  I have reviewed the patient's chart and labs.  Questions were answered to the patient's satisfaction.     [name]

## 2013-03-19 NOTE — Anesthesia Postprocedure Evaluation (Signed)
Anesthesia Post Note  Patient: Isaac [name]  Procedure(s) Performed: Procedure(s) (LRB): TOTAL KNEE ARTHROPLASTY (Right)  Anesthesia type: general  Patient location: PACU  Post pain: Pain level controlled  Post assessment: Patient's Cardiovascular Status Stable  Last Vitals:  Filed Vitals:   03/19/13 1300  BP: 169/86  Pulse: 81  Temp:   Resp: 16    Post vital signs: Reviewed and stable  Level of consciousness: sedated  Complications: No apparent anesthesia complications

## 2013-03-19 NOTE — Anesthesia Preprocedure Evaluation (Signed)
Anesthesia Evaluation  Patient identified by MRN, date of birth, ID band Patient awake    Reviewed: Allergy & Precautions, H&P , NPO status , Patient's Chart, lab work & pertinent test results  History of Anesthesia Complications (+) DIFFICULT AIRWAY  Airway       Dental   Pulmonary          Cardiovascular     Neuro/Psych    GI/Hepatic   Endo/Other    Renal/GU      Musculoskeletal   Abdominal   Peds  Hematology   Anesthesia Other Findings   Reproductive/Obstetrics                           Anesthesia Physical Anesthesia Plan  ASA: II  Anesthesia Plan: General   Post-op Pain Management:    Induction: Intravenous  Airway Management Planned:   Additional Equipment:   Intra-op Plan:   Post-operative Plan:   Informed Consent:   Plan Discussed with:   Anesthesia Plan Comments:         Anesthesia Quick Evaluation

## 2013-03-19 NOTE — Anesthesia Procedure Notes (Addendum)
Anesthesia Regional Block:  Femoral nerve block  Pre-Anesthetic Checklist: ,, timeout performed, Correct Patient, Correct Site, Correct Laterality, Correct Procedure, Correct Position, site marked, Risks and benefits discussed,  Surgical consent,  Pre-op evaluation,  At surgeon's request and post-op pain management  Laterality: Right  Prep: chloraprep       Needles:  Injection technique: Single-shot  Needle Type: Echogenic Stimulator Needle      Needle Gauge: 21 and 21 G    Additional Needles:  Procedures: ultrasound guided (picture in chart) and nerve stimulator Femoral nerve block  Nerve Stimulator or Paresthesia:  Response: 0.4 mA,   Additional Responses:   Narrative:  Start time: 03/19/2013 9:10 AM End time: 03/19/2013 9:20 AM Injection made incrementally with aspirations every 5 mL.  Performed by: Personally  Anesthesiologist: Arta Bruce MD  Additional Notes: Monitors applied. Patient sedated. Sterile prep and drape,hand hygiene and sterile gloves were used. Relevant anatomy identified.Needle position confirmed.Local anesthetic injected incrementally after negative aspiration. Local anesthetic spread visualized around nerve(s). Vascular puncture avoided. No complications. Image printed for medical record.The patient tolerated the procedure well.       Femoral nerve block Procedure Name: Intubation Date/Time: 03/19/2013 9:41 AM Performed by: Cathie Olden B Pre-anesthesia Checklist: Timeout performed, Patient identified, Emergency Drugs available, Suction available and Patient being monitored Patient Re-evaluated:Patient Re-evaluated prior to inductionOxygen Delivery Method: Circle system utilized Preoxygenation: Pre-oxygenation with 100% oxygen Intubation Type: IV induction Ventilation: Mask ventilation without difficulty Laryngoscope Size: Mac and 4 Grade View: Grade II Tube size: 7.5 mm Number of attempts: 1 (Dr. Michelle Piper performs DL and intubation, good  view of epiglottis and cricoid pressure given posterior arytenoids identified and tube placed) Airway Equipment and Method: Stylet Placement Confirmation: ETT inserted through vocal cords under direct vision,  positive ETCO2 and breath sounds checked- equal and bilateral Secured at: 22 cm Tube secured with: Tape Dental Injury: Teeth and Oropharynx as per pre-operative assessment  Difficulty Due To: Difficult Airway- due to anterior larynx Future Recommendations: Recommend- induction with short-acting agent, and alternative techniques readily available

## 2013-03-19 NOTE — H&P (View-Only) (Signed)
TOTAL KNEE ADMISSION H&P  Patient is being admitted for right total knee arthroplasty.  Subjective:  Chief Complaint:right knee pain.  HPI: Isaac [name], 66 y.o. male, has a history of pain and functional disability in the right knee due to arthritis and has failed non-surgical conservative treatments for greater than 12 weeks to includeNSAID's and/or analgesics, corticosteriod injections, flexibility and strengthening excercises, use of assistive devices and activity modification.  Onset of symptoms was gradual, starting >10 years ago with gradually worsening course since that time. The patient noted no past surgery on the right knee(s).  Patient currently rates pain in the right knee(s) at 10 out of 10 with activity. Patient has night pain, worsening of pain with activity and weight bearing, pain that interferes with activities of daily living, crepitus and joint swelling.  Patient has evidence of subchondral cysts, subchondral sclerosis, periarticular osteophytes and joint space narrowing by imaging studies. There is no active infection.  There are no active problems to display for this patient.  Past Medical History  Diagnosis Date  . Arthritis     R knee- DJD  . Complication of anesthesia     tachycardia with ST depression, HTN witth left TKA 04/2010 (neg cardiac w/u)  . Difficult intubation     glidescope used 04/06/10    Past Surgical History  Procedure Laterality Date  . Joint replacement      L TKA     (Not in a hospital admission) No Known Allergies  History  Substance Use Topics  . Smoking status: Former Smoker    Quit date: 03/10/1998  . Smokeless tobacco: Not on file  . Alcohol Use: Yes     Comment: 3 beers per week    No family history on file.   Current outpatient prescriptions:cholecalciferol (VITAMIN D) 1000 UNITS tablet, Take 1,000 Units by mouth daily., Disp: , Rfl: ;  Docusate Calcium (STOOL SOFTENER PO), Take 1 capsule by mouth daily., Disp: , Rfl: ;   Multiple Vitamin (MULTIVITAMIN WITH MINERALS) TABS tablet, Take 1 tablet by mouth daily., Disp: , Rfl: ;  VITAMIN E PO, Take 1 capsule by mouth daily., Disp: , Rfl:   Review of Systems  Musculoskeletal: Positive for joint pain.  All other systems reviewed and are negative.    Objective:  Physical Exam  Constitutional: He is oriented to person, place, and time. He appears well-developed and well-nourished.  HENT:  Head: Normocephalic and atraumatic.  Eyes: Conjunctivae and EOM are normal. Pupils are equal, round, and reactive to light.  Neck: Neck supple.  Cardiovascular: Normal rate, regular rhythm and normal heart sounds.   Respiratory: Effort normal and breath sounds normal.  GI: Soft. Bowel sounds are normal.  Musculoskeletal:  Examination of his right knee reveals marked grating and crepitus.  5 degrees of varus.  Mild flexion contracture.  Flexion maybe 90 degrees.  Neurovascularly intact distally.    Neurological: He is alert and oriented to person, place, and time.  Skin: Skin is warm and dry.  Psychiatric: He has a normal mood and affect. His behavior is normal.    Vital signs in last 24 hours: Temp: 98.3.  Blood pressure: 134/81.  Pulse: 74.  Labs:   Estimated body mass index is 34.22 kg/(m^2) as calculated from the following:   Height as of 03/10/13: 5' 8" (1.727 m).   Weight as of 03/10/13: 102.059 kg (225 lb).   Imaging Review Plain radiographs demonstrate severe degenerative joint disease of the right knee(s). The bone quality   appears to be good for age and reported activity level.  Assessment/Plan:  End stage arthritis, right knee   The patient history, physical examination, clinical judgment of the provider and imaging studies are consistent with end stage degenerative joint disease of the right knee(s) and total knee arthroplasty is deemed medically necessary. The treatment options including medical management, injection therapy arthroscopy and arthroplasty  were discussed at length. The risks and benefits of total knee arthroplasty were presented and reviewed. The risks due to aseptic loosening, infection, stiffness, patella tracking problems, thromboembolic complications and other imponderables were discussed. The patient acknowledged the explanation, agreed to proceed with the plan and consent was signed. Patient is being admitted for inpatient treatment for surgery, pain control, PT, OT, prophylactic antibiotics, VTE prophylaxis, progressive ambulation and ADL's and discharge planning. The patient is planning to be discharged home with home health services   

## 2013-03-19 NOTE — Progress Notes (Signed)
Utilization Review Completed.Isaac [name] T10/15/2014

## 2013-03-19 NOTE — Transfer of Care (Signed)
Immediate Anesthesia Transfer of Care Note  Patient: Isaac [name]  Procedure(s) Performed: Procedure(s): TOTAL KNEE ARTHROPLASTY (Right)  Patient Location: PACU  Anesthesia Type:General  Level of Consciousness: awake, oriented and patient cooperative  Airway & Oxygen Therapy: Patient Spontanous Breathing 10 liters Face mask O2  Post-op Assessment: Report given to PACU RN, Post -op Vital signs reviewed and stable and Patient moving all extremities X 4  Post vital signs: Reviewed and stable  Complications: No apparent anesthesia complications

## 2013-03-19 NOTE — Progress Notes (Signed)
Orthopedic Tech Progress Note Patient Details:  Isaac [name] 01-24-1946 [id number]  CPM Right Knee CPM Right Knee: On Right Knee Flexion (Degrees): 60 Right Knee Extension (Degrees): 0 Additional Comments: Trapeze bar   Cammer, Mickie Bail 03/19/2013, 2:04 PM

## 2013-03-19 NOTE — Progress Notes (Signed)
Orthopedic Tech Progress Note Patient Details:  Isaac [name] [birth year] [id number] On cpm at 9:00 pm RLE 0-60 Patient ID: Isaac [name], male   DOB: 1946/01/07, 67 y.o.   MRN: [id number]   Isaac [name] 03/19/2013, 9:11 PM

## 2013-03-20 LAB — CBC
HCT: 32.7 % — ABNORMAL LOW (ref 39.0–52.0)
MCH: 29.9 pg (ref 26.0–34.0)
MCV: 87.2 fL (ref 78.0–100.0)
RBC: 3.75 MIL/uL — ABNORMAL LOW (ref 4.22–5.81)
RDW: 13.8 % (ref 11.5–15.5)
WBC: 7.5 10*3/uL (ref 4.0–10.5)

## 2013-03-20 LAB — BASIC METABOLIC PANEL
BUN: 12 mg/dL (ref 6–23)
CO2: 25 mEq/L (ref 19–32)
Calcium: 7.7 mg/dL — ABNORMAL LOW (ref 8.4–10.5)
Chloride: 97 mEq/L (ref 96–112)
Creatinine, Ser: 0.78 mg/dL (ref 0.50–1.35)

## 2013-03-20 MED ORDER — WHITE PETROLATUM GEL
Status: AC
  Administered 2013-03-20: 0.2
  Filled 2013-03-20: qty 5

## 2013-03-20 NOTE — Progress Notes (Signed)
Physical Therapy Treatment Patient Details Name: Isaac [name] MRN: [id number] DOB: 03-Aug-1945 Today's Date: 03/20/2013 Time: 4098-1191 PT Time Calculation (min): 26 min  PT Assessment / Plan / Recommendation  History of Present Illness Pt s/p rt TKA.   PT Comments   Pt progressing well with therapy.  Practiced steps this session.    Follow Up Recommendations   Home health PT; supervision - intermittent     Does the patient have the potential to tolerate intense rehabilitation     Barriers to Discharge       Equipment Recommendations    None recommended by PT   Recommendations for Other Services    Frequency   7x/week  Progress towards PT Goals    Plan      Precautions / Restrictions Precautions Precautions: Knee Required Braces or Orthoses: Knee Immobilizer - Right Knee Immobilizer - Right: On when out of bed or walking Restrictions Weight Bearing Restrictions: Yes RLE Weight Bearing: Weight bearing as tolerated   Pertinent Vitals/Pain 6/10 at rest.  8/10 with activity.  Repositioned for comfort.  RN getting ready to administer pain meds.     Mobility  Bed Mobility Bed Mobility: Supine to Sit;Sitting - Scoot to Edge of Bed;Sit to Supine Supine to Sit: 5: Supervision;HOB flat Sitting - Scoot to Edge of Bed: 5: Supervision Sit to Supine: 5: Supervision;HOB flat Transfers Transfers: Sit to Stand;Stand to Sit Sit to Stand: 4: Min guard;With upper extremity assist;From bed;From toilet Stand to Sit: 4: Min guard;With upper extremity assist;To bed;To toilet Details for Transfer Assistance: cues to reinforce safe hand placement Ambulation/Gait Ambulation/Gait Assistance: 4: Min guard Ambulation Distance (Feet): 140 Feet Assistive device: Rolling walker Ambulation/Gait Assistance Details: Cues to slow down, encouragement to decrease reliance of UE's on RW.   Trialed ambulation without KI due to pt able to perform 10 SLR's independently without ext lag.  No knee  buckling noted.   Gait Pattern: Step-through pattern;Decreased step length - left;Decreased weight shift to right Stairs: Yes Stairs Assistance: 4: Min guard Stairs Assistance Details (indicate cue type and reason): (A) for balance with 1st step due to pt hesitant & Rt knee slightly buckling.  No buckling noted after initial step.  Cues for sequencing & technique.  Performed using bil rails due to pt states he can hold onto doorframe at home to enter.   Stair Management Technique: Two rails;Step to pattern;Forwards Number of Stairs: 2 (2x's) Wheelchair Mobility Wheelchair Mobility: No    Exercises Total Joint Exercises Ankle Circles/Pumps: AROM;Both;10 reps Quad Sets: AROM;Both;10 reps Hip ABduction/ADduction: AROM;Strengthening;Right;10 reps;Supine Straight Leg Raises: AROM;Strengthening;Right;10 reps;Supine     PT Goals (current goals can now be found in the care plan section) Acute Rehab PT Goals Patient Stated Goal: Return home PT Goal Formulation: With patient Time For Goal Achievement: 03/22/13 Potential to Achieve Goals: Good  Visit Information  Last PT Received On: 03/20/13 Assistance Needed: +1 History of Present Illness: Pt s/p rt TKA.    Subjective Data  Patient Stated Goal: Return home   Cognition  Cognition Arousal/Alertness: Awake/alert Behavior During Therapy: WFL for tasks assessed/performed Overall Cognitive Status: Within Functional Limits for tasks assessed    Balance     End of Session     GP     Lara Mulch 03/20/2013, 2:13 PM  Verdell Face, PTA [phone] 03/20/2013

## 2013-03-20 NOTE — Op Note (Signed)
[name], [name] NO.:  [id number]  MEDICAL RECORD NO.:  [id number]  LOCATION:  5N32C                        FACILITY:  [facility]  PHYSICIAN:  Loreta Ave, M.D. DATE OF BIRTH:  11/29/1945  DATE OF PROCEDURE:  03/19/2013 DATE OF DISCHARGE:                              OPERATIVE REPORT   PREOPERATIVE DIAGNOSIS:  Right knee end-stage degenerative arthritis, tricompartmental.  POSTOPERATIVE DIAGNOSIS:  Right knee end-stage degenerative arthritis, tricompartmental.  PROCEDURE:  Modified minimally invasive right total knee replacement, Stryker triathlon prosthesis.  Soft tissue balancing.  Cemented pegged posterior stabilized #5.  Femoral component cemented 6.  Tibial component, 9 mm polyethylene insert.  Cemented resurfacing 38-mm patellar component.  SURGEON:  Loreta Ave, M.D.  ASSISTANT:  Domingo Cocking, PA.  ANESTHESIA:  General.  ESTIMATED BLOOD LOSS:  Minimal.  SPECIMENS:  None.  COMPLICATION:  None.  DRESSINGS:  Soft compressive knee immobilizer.  TOURNIQUET TIME:  1 hour and 10 minutes.  DRAINS:  Hemovac x1.  PROCEDURE IN DETAIL:  The patient was brought to the operating room, placed on the operating table in supine position.  After adequate anesthesia had been obtained, knee examined.  Fairly good extension, flexion better than 110.  Alignment neutral.  Tourniquet applied. Prepped and draped in usual sterile fashion.  Exsanguinated with elevation of Esmarch.  Tourniquet inflated at 350 mmHg.  Straight incision above the patella down the tibial tubercle.  Skin and subcu tissue was divided.  Hemostasis with cautery.  Medial arthrotomy, vastus splitting, and preserving quad tendon.  Knee exposed.  Grade 4 change throughout.  Remnants of menisci, cruciate ligaments, periarticular spurs, loose bodies removed.  Intramedullary guide distal femur. Flexible rod.  An 8-mm resection, 5 degrees of valgus.  Using epicondylar axis, the femur  was sized, cut, and fitted for a pegged posterior stabilized #5 component.  Proximal tibial resection extramedullary guide.  A 3-degree posterior slope cut.  Size #6 component.  Nicely balanced in flexion extension.  Debris cleared throughout the knee.  Patella exposed.  Posterior 10 mm removed. Drilled, sized, and fitted for a 38-mm component.  After completely irrigating the knee trials put in place.  With the 9 mm insert, I am very pleased with stability motion as well as patellar tracking.  Tibia was marked for rotation and hand reamed.  All trials removed.  Copious irrigation with a pulse irrigating device.  Cement prepared and placed on all components, firmly seated.  Polyethylene attached to tibia and knee reduced.  Patella held with a clamp.  Once cement hardened, the knee was irrigated again.  Soft tissues was injected with Exparel. Hemovac placed.  Arthrotomy closed with Vicryl subcutaneous and subcuticular closure as well.  Sterile compressive dressing was applied. Tourniquet deflated and removed.  Knee immobilizer applied.  Anesthesia reversed.  Brought to the recovery room.  Tolerated the surgery well with no complications.     Loreta Ave, M.D.     DFM/MEDQ  D:  03/19/2013  T:  03/20/2013  Job:  [id number]

## 2013-03-20 NOTE — Progress Notes (Signed)
   CARE MANAGEMENT NOTE 03/20/2013  Patient:  Isaac [name], Isaac [name]   Account Number:  [id number]  Date Initiated:  03/20/2013  Documentation initiated by:  [facility]  Subjective/Objective Assessment:   rt TKA     Action/Plan:   HH, lives at home with wife   Anticipated DC Date:  03/21/2013   Anticipated DC Plan:  HOME W HOME HEALTH SERVICES      DC Planning Services  CM consult      [facility] Choice  HOME HEALTH   Choice offered to / List presented to:  C-1 Patient   DME arranged  CPM      DME agency  [employer]     HH arranged  HH-2 PT  HH-3 OT      HH agency  [facility]   Status of service:  Completed, signed off Medicare Important Message given?   (If response is "NO", the following Medicare IM given date fields will be blank) Date Medicare IM given:   Date Additional Medicare IM given:    Discharge Disposition:  HOME W HOME HEALTH SERVICES  Per UR Regulation:    If discussed at Long Length of Stay Meetings, dates discussed:    Comments:  03/20/2013 1740 NCM spoke to pt and he will have [facility] for [facility]. Notified [facility] of pt's scheduled dc home tomorrow. Pt has CPM from [employer]. Has DME (RW and 3n1) from previous surgery. Isidoro Donning RN CCM Case Mgmt phone [phone]

## 2013-03-20 NOTE — Progress Notes (Signed)
Patient ID: Isaac [name], male   DOB: 04-20-1946, 67 y.o.   MRN: [id number]  PROGRESS NOTE  Subjective:  negative for Chest Pain  negative for Shortness of Breath  negative for Nausea/Vomiting   negative for Calf Pain  negative for Bowel Movement   Tolerating Diet: yes         Patient reports pain as 8 on 0-10 scale.       Objective: Vital signs in last 24 hours:   Patient Vitals for the past 24 hrs:  BP Temp Temp src Pulse Resp SpO2  03/20/13 0533 114/66 mmHg 97.9 F (36.6 C) Oral 89 18 98 %  03/20/13 0157 132/66 mmHg 98.8 F (37.1 C) Oral 86 19 98 %  03/19/13 2141 132/71 mmHg 98.9 F (37.2 C) Oral 91 20 97 %  03/19/13 1600 - - - - 20 96 %  03/19/13 1450 109/51 mmHg 99.7 F (37.6 C) Oral 97 18 98 %  03/19/13 1415 - 97.8 F (36.6 C) - - - -  03/19/13 1400 125/66 mmHg - - 68 20 99 %  03/19/13 1330 132/74 mmHg - - 74 10 98 %  03/19/13 1300 169/86 mmHg - - 81 16 99 %  03/19/13 1245 171/94 mmHg - - 83 16 100 %  03/19/13 1241 - 97.2 F (36.2 C) - - - -  03/19/13 0923 - - - 69 12 98 %  03/19/13 0922 - - - 69 15 98 %  03/19/13 0921 138/56 mmHg - - 68 15 98 %  03/19/13 0920 138/56 mmHg - - 70 16 99 %  03/19/13 0919 - - - 67 11 99 %  03/19/13 0917 - - - 66 15 98 %  03/19/13 0915 - - - 70 15 99 %  03/19/13 0910 - - - 68 15 98 %  03/19/13 0905 - - - 63 12 99 %  03/19/13 0900 - - - 65 19 100 %  03/19/13 0855 - - - 65 10 99 %  03/19/13 0845 - - - 65 18 99 %  03/19/13 0840 - - - 66 14 99 %  03/19/13 0838 - - - 66 18 99 %  03/19/13 0830 142/77 mmHg - - 67 14 99 %  03/19/13 0827 - - - 64 10 100 %  03/19/13 0825 - - - 66 12 99 %  03/19/13 0824 143/82 mmHg - - 64 18 -  03/19/13 0823 - - - 64 11 98 %  03/19/13 0822 143/82 mmHg - - 64 18 99 %  03/19/13 0732 124/70 mmHg 97.3 F (36.3 C) Oral 65 20 96 %      Intake/Output from previous day:   10/15 0701 - 10/16 0700 In: 2950 [I.V.:2400] Out: 2230 [Urine:2050; Drains:80]   Intake/Output this shift:   10/15 1901 - 10/16  0700 In: -  Out: 1630 [Urine:1550; Drains:80]   Intake/Output     10/15 0701 - 10/16 0700   I.V. 2400   Other 500   IV Piggyback 50   Total Intake 2950   Urine 2050   Drains 80   Blood 100   Total Output 2230   Net +720          LABORATORY DATA:  Recent Labs  03/20/13 0415  WBC 7.5  HGB 11.2*  HCT 32.7*  PLT 220    Recent Labs  03/20/13 0415  NA 130*  K 4.0  CL 97  CO2 25  BUN 12  CREATININE 0.78  GLUCOSE 132*  CALCIUM 7.7*   Lab Results  Component Value Date   INR 1.00 03/10/2013   INR 2.26* 04/09/2010   INR 1.44 04/08/2010    Examination:  General appearance: alert, cooperative and no distress  Wound Exam: dressing in place  Drainage:  No drainage noted through dressing. Hemovac removed w/o comps  Motor Exam: grossly intact bilateral LE  Sensory Exam: grossly intact bilateral LE  Vascular Exam: Normal  Assessment:    1 Day Post-Op  Procedure(s) (LRB): TOTAL KNEE ARTHROPLASTY (Right)  ADDITIONAL DIAGNOSIS:  Active Problems:   * No active hospital problems. *     Plan: Physical Therapy as ordered Weight Bearing as Tolerated (WBAT)  DVT Prophylaxis:  Aspirin, Foot Pumps and TED hose  DISCHARGE PLAN: Home  DISCHARGE NEEDS: HHPT and CPM, DME rec per PT/OT         Zaylen Susman JAMES 03/20/2013, 6:51 AM

## 2013-03-20 NOTE — Evaluation (Signed)
Occupational Therapy Evaluation and Discharge Summary Patient Details Name: Isaac [name] MRN: [id number] DOB: [birth year] Today's Date: 03/20/2013 Time: 4098-1191 OT Time Calculation (min): 17 min  OT Assessment / Plan / Recommendation History of present illness Pt s/p rt TKA.   Clinical Impression   Pt admitted with above diagnosis.  Pt had TKA 3 years ago. All techniques reviewed per pt's granddaughter's request and pt overall does well with adls.  Pt has assist at home.    OT Assessment  Patient does not need any further OT services    Follow Up Recommendations  No OT follow up    Barriers to Discharge      Equipment Recommendations  None recommended by OT    Recommendations for Other Services    Frequency       Precautions / Restrictions Precautions Precautions: Knee Required Braces or Orthoses: Knee Immobilizer - Right Knee Immobilizer - Right: On when out of bed or walking Restrictions Weight Bearing Restrictions: Yes RLE Weight Bearing: Weight bearing as tolerated   Pertinent Vitals/Pain Pt has 7/10 pain in knee.  Nursing aware and pt to get meds at 10:50.    ADL  Eating/Feeding: Performed;Independent Where Assessed - Eating/Feeding: Chair Grooming: Performed;Wash/dry face;Wash/dry hands;Supervision/safety Where Assessed - Grooming: Unsupported standing Upper Body Bathing: Set up;Simulated Where Assessed - Upper Body Bathing: Unsupported sitting Lower Body Bathing: Simulated;Minimal assistance Where Assessed - Lower Body Bathing: Unsupported sit to stand Upper Body Dressing: Simulated;Set up Where Assessed - Upper Body Dressing: Unsupported sitting Lower Body Dressing: Performed;Moderate assistance Where Assessed - Lower Body Dressing: Supported sit to stand Toilet Transfer: Research scientist (life sciences) Method: Other (comment) (ambulated) Acupuncturist: Comfort height toilet Toileting - Clothing Manipulation and Hygiene:  Performed;Supervision/safety Where Assessed - Engineer, mining and Hygiene: Standing Equipment Used: Rolling walker;Knee Immobilizer Transfers/Ambulation Related to ADLs: Pt walked in room and performed all transfers with S. Pt seems to have good carry over since last surgery. ADL Comments: Pt only needs assist with LE adls and will get assist from family.  ADL techniques reviewed and pt familiar from knee replacment 3 years ago.    OT Diagnosis:    OT Problem List:   OT Treatment Interventions:     OT Goals(Current goals can be found in the care plan section) Acute Rehab OT Goals Patient Stated Goal: Return home  Visit Information  Last OT Received On: 03/20/13 Assistance Needed: +1 History of Present Illness: Pt s/p rt TKA.       Prior Functioning     Home Living Family/patient expects to be discharged to:: Private residence Living Arrangements: Children Available Help at Discharge: Family;Available PRN/intermittently Type of Home: House Home Access: Stairs to enter [employer] of Steps: 2 Entrance Stairs-Rails: Right Home Layout: One level Home Equipment: Walker - 2 wheels;Bedside commode;Other (comment) Prior Function Level of Independence: Independent Communication Communication: No difficulties Dominant Hand: Right         Vision/Perception     Cognition  Cognition Arousal/Alertness: Awake/alert Behavior During Therapy: WFL for tasks assessed/performed Overall Cognitive Status: Within Functional Limits for tasks assessed    Extremity/Trunk Assessment Upper Extremity Assessment Upper Extremity Assessment: Overall WFL for tasks assessed Lower Extremity Assessment Lower Extremity Assessment: Defer to PT evaluation RLE Deficits / Details: Fair quad set.  AAROM knee 5-80 degrees in sitting. Cervical / Trunk Assessment Cervical / Trunk Assessment: Normal     Mobility Bed Mobility Bed Mobility: Sit to Supine Sit to Supine: 4:  Min guard Transfers  Transfers: Sit to Stand;Stand to Sit Sit to Stand: 4: Min guard;From chair/3-in-1 Stand to Sit: 4: Min guard;With upper extremity assist;With armrests;To chair/3-in-1;To toilet Details for Transfer Assistance: Cues for safety to reach back to bed before sitting.     Exercise Total Joint Exercises Quad Sets: AROM;Both;10 reps;Seated Knee Flexion: AAROM;Right;5 reps;Seated Goniometric ROM: 5-80 degrees   Balance Balance Balance Assessed: Yes Static Standing Balance Static Standing - Balance Support: Bilateral upper extremity supported Static Standing - Level of Assistance: 5: Stand by assistance   End of Session OT - End of Session Equipment Utilized During Treatment: Rolling walker;Right knee immobilizer Activity Tolerance: Patient tolerated treatment well Patient left: in bed;in CPM;with call bell/phone within reach;with family/visitor present Nurse Communication: Mobility status CPM Right Knee CPM Right Knee: On Right Knee Flexion (Degrees): 60 Right Knee Extension (Degrees): 0 Additional Comments: Trapeze bar  GO     Isaac [name] 03/20/2013, 10:32 AM [phone]

## 2013-03-20 NOTE — Evaluation (Signed)
Physical Therapy Evaluation Patient Details Name: Isaac [name] MRN: [id number] DOB: April 05, 1946 Today's Date: 03/20/2013 Time: 7829-5621 PT Time Calculation (min): 25 min  PT Assessment / Plan / Recommendation History of Present Illness  Pt s/p rt TKA.  Clinical Impression  Pt is s/p TKA resulting in the deficits listed below (see PT Problem List).  Pt will benefit from skilled PT to increase their independence and safety with mobility to allow discharge to the venue listed below.      PT Assessment  Patient needs continued PT services    Follow Up Recommendations  Home health PT;Supervision - Intermittent    Does the patient have the potential to tolerate intense rehabilitation      Barriers to Discharge        Equipment Recommendations  None recommended by PT    Recommendations for Other Services     Frequency 7X/week    Precautions / Restrictions Precautions Precautions: Knee Required Braces or Orthoses: Knee Immobilizer - Right Knee Immobilizer - Right: On when out of bed or walking   Pertinent Vitals/Pain Rt knee surgical pain.      Mobility  Transfers Transfers: Sit to Stand;Stand to Sit Sit to Stand: 4: Min guard;With upper extremity assist;With armrests;From chair/3-in-1;From toilet Stand to Sit: 4: Min guard;With upper extremity assist;With armrests;To chair/3-in-1;To toilet Ambulation/Gait Ambulation/Gait Assistance: 5: Supervision Ambulation Distance (Feet): 150 Feet Assistive device: Rolling walker Ambulation/Gait Assistance Details: Several standing breaks to rest arms.  Gait Pattern: Step-through pattern;Decreased step length - left;Decreased stance time - right Gait velocity: decr    Exercises Total Joint Exercises Quad Sets: AROM;Both;10 reps;Seated Knee Flexion: AAROM;Right;5 reps;Seated Goniometric ROM: 5-80 degrees   PT Diagnosis: Difficulty walking  PT Problem List: Decreased strength;Decreased range of motion;Decreased mobility PT  Treatment Interventions: DME instruction;Gait training;Stair training;Functional mobility training;Therapeutic activities;Therapeutic exercise;Patient/family education     PT Goals(Current goals can be found in the care plan section) Acute Rehab PT Goals Patient Stated Goal: Return home PT Goal Formulation: With patient Time For Goal Achievement: 03/22/13 Potential to Achieve Goals: Good  Visit Information  Last PT Received On: 03/20/13 Assistance Needed: +1 History of Present Illness: Pt s/p rt TKA.       Prior Functioning  Home Living Family/patient expects to be discharged to:: Private residence Living Arrangements: Children Available Help at Discharge: Family;Available PRN/intermittently Type of Home: House Home Access: Stairs to enter [employer] of Steps: 2 Entrance Stairs-Rails: Right Home Layout: One level Home Equipment: Walker - 2 wheels;Bedside commode;Other (comment) (CPM) Prior Function Level of Independence: Independent Communication Communication: No difficulties    Cognition  Cognition Arousal/Alertness: Awake/alert Behavior During Therapy: WFL for tasks assessed/performed Overall Cognitive Status: Within Functional Limits for tasks assessed    Extremity/Trunk Assessment Upper Extremity Assessment Upper Extremity Assessment: Overall WFL for tasks assessed Lower Extremity Assessment Lower Extremity Assessment: RLE deficits/detail RLE Deficits / Details: Fair quad set.  AAROM knee 5-80 degrees in sitting.   Balance Balance Balance Assessed: Yes Static Standing Balance Static Standing - Balance Support: Bilateral upper extremity supported Static Standing - Level of Assistance: 5: Stand by assistance  End of Session PT - End of Session Equipment Utilized During Treatment: Gait belt Activity Tolerance: Patient tolerated treatment well Patient left: in chair;with call bell/phone within reach;with family/visitor present Nurse Communication:  Mobility status  GP     Maudine Kluesner 03/20/2013, 10:13 AM  [employer] PT [phone]

## 2013-03-21 ENCOUNTER — Encounter (HOSPITAL_COMMUNITY): Payer: Self-pay | Admitting: Orthopedic Surgery

## 2013-03-21 LAB — BASIC METABOLIC PANEL
CO2: 26 mEq/L (ref 19–32)
Chloride: 103 mEq/L (ref 96–112)
Glucose, Bld: 106 mg/dL — ABNORMAL HIGH (ref 70–99)
Potassium: 4 mEq/L (ref 3.5–5.1)
Sodium: 136 mEq/L (ref 135–145)

## 2013-03-21 LAB — CBC
HCT: 32.4 % — ABNORMAL LOW (ref 39.0–52.0)
Hemoglobin: 11.1 g/dL — ABNORMAL LOW (ref 13.0–17.0)
MCH: 29.8 pg (ref 26.0–34.0)
Platelets: 182 10*3/uL (ref 150–400)
RBC: 3.72 MIL/uL — ABNORMAL LOW (ref 4.22–5.81)

## 2013-03-21 MED ORDER — SENNOSIDES 8.6 MG PO TABS: 1.0000 | ORAL_TABLET | Freq: Every day | ORAL | Status: DC

## 2013-03-21 MED ORDER — METHOCARBAMOL 500 MG PO TABS: 500.0000 mg | ORAL_TABLET | Freq: Four times a day (QID) | ORAL | Status: DC

## 2013-03-21 MED ORDER — ASPIRIN 325 MG PO TABS: 325.0000 mg | ORAL_TABLET | Freq: Every day | ORAL | Status: DC

## 2013-03-21 NOTE — Progress Notes (Signed)
Physical Therapy Treatment Patient Details Name: Isaac [name] MRN: [id number] DOB: 1945/08/20 Today's Date: 03/21/2013 Time: 5621-3086 PT Time Calculation (min): 19 min  PT Assessment / Plan / Recommendation  History of Present Illness Pt s/p rt TKA.   PT Comments   Pt progressing well towards physical therapy goals. Pt encouraged to practice active ROM and not rely on CPM machine only. Frequent VC's required during gait training for technique and sequencing with the RW as pt likes to rush to gain distance quickly.  Follow Up Recommendations  Home health PT;Supervision - Intermittent     Does the patient have the potential to tolerate intense rehabilitation     Barriers to Discharge        Equipment Recommendations  None recommended by PT    Recommendations for Other Services    Frequency 7X/week   Progress towards PT Goals Progress towards PT goals: Progressing toward goals  Plan Current plan remains appropriate    Precautions / Restrictions Precautions Precautions: Knee Restrictions Weight Bearing Restrictions: Yes RLE Weight Bearing: Weight bearing as tolerated   Pertinent Vitals/Pain Pt reports 6/10 pain at end of session.     Mobility  Bed Mobility Bed Mobility: Supine to Sit;Sitting - Scoot to Edge of Bed Supine to Sit: 6: Modified independent (Device/Increase time);HOB flat Sitting - Scoot to Edge of Bed: 6: Modified independent (Device/Increase time) Details for Bed Mobility Assistance: No physical assist needed. Pt able to transfer to EOB without use of rails and HOB flat, with good technique and safety awareness. Transfers Transfers: Sit to Stand;Stand to Sit Sit to Stand: 6: Modified independent (Device/Increase time);From bed;With upper extremity assist Stand to Sit: 6: Modified independent (Device/Increase time);To chair/3-in-1;With upper extremity assist Details for Transfer Assistance: Pt demonstrated proper hand placement and safety  awareness. Ambulation/Gait Ambulation/Gait Assistance: 5: Supervision Ambulation Distance (Feet): 200 Feet Assistive device: Rolling walker Ambulation/Gait Assistance Details: VC's for increased heel strike, increased toe off, increased step length on L to initiate step-through gait pattern, and fluidity of walker movement. Gait Pattern: Step-through pattern;Decreased stride length Gait velocity: decreased General Gait Details: Pt rushes through gait cycle, requires cues to slow down and focus on technique Stairs: No Stairs Assistance Details (indicate cue type and reason): Pt reports he feels comfortable with steps at home and has no concerns with entering home.    Exercises Total Joint Exercises Quad Sets: 15 reps;Right Heel Slides: 15 reps;AROM;10 reps;AAROM Goniometric ROM: 6-95   PT Diagnosis:    PT Problem List:   PT Treatment Interventions:     PT Goals (current goals can now be found in the care plan section) Acute Rehab PT Goals Patient Stated Goal: Return home PT Goal Formulation: With patient Time For Goal Achievement: 03/22/13 Potential to Achieve Goals: Good  Visit Information  Last PT Received On: 03/21/13 Assistance Needed: +1 History of Present Illness: Pt s/p rt TKA.    Subjective Data  Patient Stated Goal: Return home   Cognition  Cognition Arousal/Alertness: Awake/alert Behavior During Therapy: WFL for tasks assessed/performed Overall Cognitive Status: Within Functional Limits for tasks assessed    Balance     End of Session PT - End of Session Equipment Utilized During Treatment: Gait belt Activity Tolerance: Patient tolerated treatment well Patient left: in chair;with call bell/phone within reach Nurse Communication: Mobility status   GP     Ruthann Cancer 03/21/2013, 12:53 PM  Ruthann Cancer, PT, DPT Acute Rehabilitation Services [phone]

## 2013-03-21 NOTE — Progress Notes (Signed)
PT Cancellation Note  Patient Details Name: Isaac [name] MRN: [id number] DOB: 09-09-1945   Cancelled Treatment:    Reason Eval/Treat Not Completed: Patient declined, stating his daughter is coming to pick him up in 25 minutes. Pt states he is comfortable with functional mobility to get in and around his home, and does not wish to practice anything else before d/c.   Ruthann Cancer 03/21/2013, 2:34 PM  Ruthann Cancer, PT, DPT Acute Rehabilitation Services [phone]

## 2013-03-21 NOTE — Discharge Summary (Signed)
PATIENT ID: Isaac [name]        MRN:  [id number]          DOB/AGE: 1945/07/05 / 67 y.o.    DISCHARGE SUMMARY  ADMISSION DATE:    03/19/2013 DISCHARGE DATE:   03/21/2013   ADMISSION DIAGNOSIS: OA RIGHT KNEE    DISCHARGE DIAGNOSIS:  OA RIGHT KNEE    ADDITIONAL DIAGNOSIS: Active Problems:   * No active hospital problems. *  Past Medical History  Diagnosis Date  . Arthritis     R knee- DJD  . Complication of anesthesia     tachycardia with ST depression, HTN witth left TKA 04/2010 (neg cardiac w/u)  . Difficult intubation     glidescope used 04/06/10    PROCEDURE: Procedure(s): TOTAL KNEE ARTHROPLASTY Right on 03/19/2013  CONSULTS: PT/OT, Care management      HISTORY:  See H&P in chart  HOSPITAL COURSE:  [name] is a 67 y.o. admitted on 03/19/2013 and found to have a diagnosis of OA RIGHT KNEE.  After appropriate laboratory studies were obtained  they were taken to the operating room on 03/19/2013 and underwent  Procedure(s): TOTAL KNEE ARTHROPLASTY  Right.   They were given perioperative antibiotics:  Anti-infectives   Start     Dose/Rate Route Frequency Ordered Stop   03/19/13 1530  ceFAZolin (ANCEF) IVPB 2 g/50 mL premix     2 g 100 mL/hr over 30 Minutes Intravenous Every 6 hours 03/19/13 1526 03/19/13 2337   03/19/13 0600  ceFAZolin (ANCEF) IVPB 2 g/50 mL premix     2 g 100 mL/hr over 30 Minutes Intravenous On call to O.R. 03/18/13 1415 03/19/13 1010    .  Tolerated the procedure well.  Placed with a foley intraoperatively.      POD #1, allowed out of bed to a chair.  PT for ambulation and exercise program.  Foley D/C'd in morning.  IV saline locked.  O2 discontionued. Hemovac pulled.  POD #2, continued PT and ambulation.  .  The remainder of the hospital course was dedicated to ambulation and strengthening.   The patient was discharged on 2 Days Post-Op in  Stable condition.  Blood products given:none  DIAGNOSTIC STUDIES: Recent vital signs:  Patient Vitals for the past 24 hrs:  BP Temp Temp src Pulse Resp SpO2  03/21/13 0555 122/70 mmHg 98.1 F (36.7 C) - 87 18 96 %  03/21/13 0400 - - - - 19 93 %  03/21/13 0000 - - - - 17 93 %  03/20/13 2054 132/72 mmHg 98.6 F (37 C) - 95 18 93 %  03/20/13 2000 - - - - 18 93 %  03/20/13 1500 119/72 mmHg 97.8 F (36.6 C) - 83 16 96 %  03/20/13 0930 128/72 mmHg 97.5 F (36.4 C) Oral 84 18 96 %       Recent laboratory studies:  Recent Labs  03/20/13 0415 03/21/13 0620  WBC 7.5 7.5  HGB 11.2* 11.1*  HCT 32.7* 32.4*  PLT 220 182    Recent Labs  03/20/13 0415 03/21/13 0620  NA 130* 136  K 4.0 4.0  CL 97 103  CO2 25 26  BUN 12 12  CREATININE 0.78 0.79  GLUCOSE 132* 106*  CALCIUM 7.7* 8.2*   Lab Results  Component Value Date   INR 1.00 03/10/2013   INR 2.26* 04/09/2010   INR 1.44 04/08/2010     Recent Radiographic Studies :  Dg Chest 2 View  03/10/2013  CLINICAL DATA:  Preoperative evaluation for knee replacement  EXAM: CHEST  2 VIEW  COMPARISON:  04/06/2010  FINDINGS: The heart size and mediastinal contours are within normal limits. Both lungs are clear. The visualized skeletal structures are unremarkable.  IMPRESSION: No active cardiopulmonary disease.   Electronically Signed   By: Alcide Clever M.D.   On: 03/10/2013 15:51   Dg Knee 1-2 Views Right  03/19/2013   CLINICAL DATA:  Postoperative study.  EXAM: RIGHT KNEE - 1-2 VIEW  COMPARISON:  None.  FINDINGS: Postoperative changes of right total knee arthroplasty are noted. The femoral and tibial components of the prosthesis appear properly seated without definite periprosthetic fracture or other immediate complicating features. A surgical drain is in place, and there is gas within the joint space and the overlying soft tissues.  IMPRESSION: 1. Postoperative changes of recent right total knee arthroplasty, as above.   Electronically Signed   By: Trudie Reed M.D.   On: 03/19/2013 14:08    DISCHARGE  INSTRUCTIONS: Discharge Orders   Future Orders Complete By Expires   Call MD / Call 911  As directed    Comments:     If you experience chest pain or shortness of breath, CALL 911 and be transported to the hospital emergency room.  If you develope a fever above 101 F, pus (white drainage) or increased drainage or redness at the wound, or calf pain, call your surgeon's office.   Change dressing  As directed    Comments:     change the dressing daily with sterile 4 x 4 inch gauze dressing and apply TED hose.  You may clean the incision with alcohol prior to redressing.   Constipation Prevention  As directed    Comments:     Drink plenty of fluids.  Prune juice may be helpful.  You may use a stool softener, such as Colace (over the counter) 100 mg twice a day.  Use MiraLax (over the counter) for constipation as needed.   CPM  As directed    Comments:     Continuous passive motion machine (CPM):      Use the CPM from 0 to 90 for 6 hours per day.           Use CPM for 2 weeks or until you are told to stop.   Diet - low sodium heart healthy  As directed    Discharge instructions  As directed    Comments:     Diet: You may resume  the diet you were on prior to surgery  Activity: Continue physical therapy protocol. Increase activity slowly as tolerated. No  lifting until further notice.   Driving: No driving until further notice.  CPM: Continuous passive motion machine (CPM):       Use the CPM from 0 to 70 degrees for 6-8 hours per day.       You may increase by 10 degrees per day as tolerated.  You may break it up into  2 or 3 sessions per day.       Use CPM for 3-4 weeks or until you are told to stop.  TED hose: Use stockings (TED hose) for 3-4 weeks on both leg(s).  You may remove  them at night for sleeping.  Shower:  May shower without a dressing once there is no drainage from your wound.                 Do NOT wash over the wound.  If drainage remains, cover wound with saran                   Wrap and then shower.  Clean incision with betadine and change dressing                        After saran wrap removed.  Do not apply any creams or ointments to incision.   Dressing:  You may change your dressing on Monday                    Then change the dressing daily with sterile 4"x4"s gauze dressing                     And TED hose for knees.  Use paper tape to hold dressing in place                     For hips.  You may clean the incision with alcohol prior to redressing.  Weight Bearing:   weight bearing as taught in physical therapy.  Use a walker or                    Crutches as instructed.  To prevent constipation: you may use a stool softener such as -               Colace ( over the counter) 100 mg by mouth twice a day                Drink plenty of fluids ( prune juice may be helpful) and high fiber foods                Miralax ( over the counter) for constipation as needed.    Medications: Scripts for  Aspirin, Robaxin, and senna provided or called in to  pharmacy  Precautions:  SEEK MEDICAL CARE IF:  You have swelling of your calf or leg.   You develop shortness of breath or chest pain.   You have redness, swelling, or increasing pain in the wound.   There is pus or any unusual drainage coming from the surgical site.   You notice a bad smell coming from the surgical site or dressing.   The surgical site breaks open after sutures or staples have been removed.   There is persistent bleeding from the suture or staple line.   You are getting worse or are not improving.   You have any other questions or concerns.  SEEK IMMEDIATE MEDICAL CARE IF:   You develop chest pain  You have a fever.   You develop a rash.   You have difficulty breathing.   You develop any reaction or side effects to medicines given.   Your knee motion is decreasing rather than improving.  MAKE SURE YOU:   Understand these instructions.   Will watch your condition.   Will get help  right away if you are not doing well or get worse.   Do not put a pillow under the knee. Place it under the heel.  As directed    Increase activity slowly as tolerated  As directed    Patient may shower  As directed    Comments:     You may shower without a dressing once there is no drainage x 2 days. If drainage remains, cover wound with plastic wrap and then shower.   TED hose  As directed    Comments:     Use stockings (TED hose) for 2 weeks on both leg(s).  You may remove them at night for sleeping.   Weight bearing as tolerated  As directed    Questions:     Laterality:     Extremity:        DISCHARGE MEDICATIONS:     Medication List         aspirin 325 MG tablet  Take 1 tablet (325 mg total) by mouth daily.     cholecalciferol 1000 UNITS tablet  Commonly known as:  VITAMIN D  Take 1,000 Units by mouth daily.     methocarbamol 500 MG tablet  Commonly known as:  ROBAXIN  Take 1 tablet (500 mg total) by mouth 4 (four) times daily.     multivitamin with minerals Tabs tablet  Take 1 tablet by mouth daily.     senna 8.6 MG tablet  Commonly known as:  SENOKOT  Take 1 tablet (8.6 mg total) by mouth daily.     STOOL SOFTENER PO  Take 1 capsule by mouth daily.     VITAMIN E PO  Take 1 capsule by mouth daily.        FOLLOW UP VISIT:       Follow-up Information   Follow up with Loreta Ave, MD In 2 weeks. [facility])    Specialty:  Orthopedic Surgery   Contact information:   [address] [place] [address] [phone]       DISPOSITION:   Home  CONDITION:  Stable   Wilkie Aye 03/21/2013, 7:55 AM

## 2013-03-21 NOTE — Progress Notes (Signed)
Patient ID: Isaac [name], male   DOB: 05/08/1946, 67 y.o.   MRN: [id number]  PROGRESS NOTE  Subjective:  negative for Chest Pain  negative for Shortness of Breath  negative for Nausea/Vomiting   negative for Calf Pain  negative for Bowel Movement   Tolerating Diet: yes         Patient reports pain as 7 on 0-10 scale.       Objective: Vital signs in last 24 hours:   Patient Vitals for the past 24 hrs:  BP Temp Temp src Pulse Resp SpO2  03/21/13 0555 122/70 mmHg 98.1 F (36.7 C) - 87 18 96 %  03/21/13 0400 - - - - 19 93 %  03/21/13 0000 - - - - 17 93 %  03/20/13 2054 132/72 mmHg 98.6 F (37 C) - 95 18 93 %  03/20/13 2000 - - - - 18 93 %  03/20/13 1500 119/72 mmHg 97.8 F (36.6 C) - 83 16 96 %  03/20/13 0930 128/72 mmHg 97.5 F (36.4 C) Oral 84 18 96 %      Intake/Output from previous day:   10/16 0701 - 10/17 0700 In: 605 [P.O.:605] Out: 850 [Urine:850]   Intake/Output this shift:       Intake/Output     10/16 0701 - 10/17 0700 10/17 0701 - 10/18 0700   P.O. 605    I.V.     Other     IV Piggyback     Total Intake 605     Urine 850    Drains     Blood     Total Output 850     Net -245          Urine Occurrence 3 x       LABORATORY DATA:  Recent Labs  03/20/13 0415 03/21/13 0620  WBC 7.5 7.5  HGB 11.2* 11.1*  HCT 32.7* 32.4*  PLT 220 182    Recent Labs  03/20/13 0415 03/21/13 0620  NA 130* 136  K 4.0 4.0  CL 97 103  CO2 25 26  BUN 12 12  CREATININE 0.78 0.79  GLUCOSE 132* 106*  CALCIUM 7.7* 8.2*   Lab Results  Component Value Date   INR 1.00 03/10/2013   INR 2.26* 04/09/2010   INR 1.44 04/08/2010    Examination:  General appearance: alert, cooperative and no distress  Wound Exam: clean, dry, intact , dressing changed  Drainage:  None: wound tissue dry  Motor Exam: grossly intact bilateral LE  Sensory Exam: grossly intact bilateral LE  Vascular Exam: Normal  Assessment:    2 Days Post-Op  Procedure(s) (LRB): TOTAL KNEE  ARTHROPLASTY (Right)  ADDITIONAL DIAGNOSIS:  Active Problems:   * No active hospital problems. *     Plan: Physical Therapy as ordered Weight Bearing as Tolerated (WBAT)  DVT Prophylaxis:  Aspirin, Foot Pumps and TED hose  DISCHARGE PLAN: Home  DISCHARGE NEEDS: HHPT and CPM, DME rec per PT/OT         Tymia Streb JAMES 03/21/2013, 7:47 AM

## 2013-03-22 DIAGNOSIS — Z471 Aftercare following joint replacement surgery: Secondary | ICD-10-CM | POA: Diagnosis not present

## 2013-03-22 DIAGNOSIS — Z96659 Presence of unspecified artificial knee joint: Secondary | ICD-10-CM | POA: Diagnosis not present

## 2013-03-22 DIAGNOSIS — IMO0001 Reserved for inherently not codable concepts without codable children: Secondary | ICD-10-CM | POA: Diagnosis not present

## 2013-03-22 DIAGNOSIS — M25569 Pain in unspecified knee: Secondary | ICD-10-CM | POA: Diagnosis not present

## 2013-03-24 DIAGNOSIS — Z96659 Presence of unspecified artificial knee joint: Secondary | ICD-10-CM | POA: Diagnosis not present

## 2013-03-24 DIAGNOSIS — IMO0001 Reserved for inherently not codable concepts without codable children: Secondary | ICD-10-CM | POA: Diagnosis not present

## 2013-03-24 DIAGNOSIS — M25569 Pain in unspecified knee: Secondary | ICD-10-CM | POA: Diagnosis not present

## 2013-03-24 DIAGNOSIS — Z471 Aftercare following joint replacement surgery: Secondary | ICD-10-CM | POA: Diagnosis not present

## 2013-03-25 DIAGNOSIS — Z96659 Presence of unspecified artificial knee joint: Secondary | ICD-10-CM | POA: Diagnosis not present

## 2013-03-25 DIAGNOSIS — IMO0001 Reserved for inherently not codable concepts without codable children: Secondary | ICD-10-CM | POA: Diagnosis not present

## 2013-03-25 DIAGNOSIS — Z471 Aftercare following joint replacement surgery: Secondary | ICD-10-CM | POA: Diagnosis not present

## 2013-03-25 DIAGNOSIS — M25569 Pain in unspecified knee: Secondary | ICD-10-CM | POA: Diagnosis not present

## 2013-03-26 DIAGNOSIS — IMO0001 Reserved for inherently not codable concepts without codable children: Secondary | ICD-10-CM | POA: Diagnosis not present

## 2013-03-26 DIAGNOSIS — Z96659 Presence of unspecified artificial knee joint: Secondary | ICD-10-CM | POA: Diagnosis not present

## 2013-03-26 DIAGNOSIS — M25569 Pain in unspecified knee: Secondary | ICD-10-CM | POA: Diagnosis not present

## 2013-03-26 DIAGNOSIS — Z471 Aftercare following joint replacement surgery: Secondary | ICD-10-CM | POA: Diagnosis not present

## 2013-03-27 DIAGNOSIS — Z471 Aftercare following joint replacement surgery: Secondary | ICD-10-CM | POA: Diagnosis not present

## 2013-03-27 DIAGNOSIS — M25569 Pain in unspecified knee: Secondary | ICD-10-CM | POA: Diagnosis not present

## 2013-03-27 DIAGNOSIS — IMO0001 Reserved for inherently not codable concepts without codable children: Secondary | ICD-10-CM | POA: Diagnosis not present

## 2013-03-27 DIAGNOSIS — Z96659 Presence of unspecified artificial knee joint: Secondary | ICD-10-CM | POA: Diagnosis not present

## 2013-03-28 DIAGNOSIS — Z471 Aftercare following joint replacement surgery: Secondary | ICD-10-CM | POA: Diagnosis not present

## 2013-03-28 DIAGNOSIS — Z96659 Presence of unspecified artificial knee joint: Secondary | ICD-10-CM | POA: Diagnosis not present

## 2013-03-28 DIAGNOSIS — M25569 Pain in unspecified knee: Secondary | ICD-10-CM | POA: Diagnosis not present

## 2013-03-28 DIAGNOSIS — IMO0001 Reserved for inherently not codable concepts without codable children: Secondary | ICD-10-CM | POA: Diagnosis not present

## 2013-03-31 DIAGNOSIS — M25569 Pain in unspecified knee: Secondary | ICD-10-CM | POA: Diagnosis not present

## 2013-03-31 DIAGNOSIS — IMO0001 Reserved for inherently not codable concepts without codable children: Secondary | ICD-10-CM | POA: Diagnosis not present

## 2013-03-31 DIAGNOSIS — Z471 Aftercare following joint replacement surgery: Secondary | ICD-10-CM | POA: Diagnosis not present

## 2013-03-31 DIAGNOSIS — Z96659 Presence of unspecified artificial knee joint: Secondary | ICD-10-CM | POA: Diagnosis not present

## 2013-04-02 DIAGNOSIS — Z96659 Presence of unspecified artificial knee joint: Secondary | ICD-10-CM | POA: Diagnosis not present

## 2013-04-02 DIAGNOSIS — IMO0001 Reserved for inherently not codable concepts without codable children: Secondary | ICD-10-CM | POA: Diagnosis not present

## 2013-04-02 DIAGNOSIS — M25569 Pain in unspecified knee: Secondary | ICD-10-CM | POA: Diagnosis not present

## 2013-04-02 DIAGNOSIS — Z471 Aftercare following joint replacement surgery: Secondary | ICD-10-CM | POA: Diagnosis not present

## 2013-04-04 DIAGNOSIS — Z471 Aftercare following joint replacement surgery: Secondary | ICD-10-CM | POA: Diagnosis not present

## 2013-04-04 DIAGNOSIS — IMO0001 Reserved for inherently not codable concepts without codable children: Secondary | ICD-10-CM | POA: Diagnosis not present

## 2013-04-04 DIAGNOSIS — M25569 Pain in unspecified knee: Secondary | ICD-10-CM | POA: Diagnosis not present

## 2013-04-04 DIAGNOSIS — Z96659 Presence of unspecified artificial knee joint: Secondary | ICD-10-CM | POA: Diagnosis not present

## 2013-04-07 DIAGNOSIS — M25569 Pain in unspecified knee: Secondary | ICD-10-CM | POA: Diagnosis not present

## 2013-04-07 DIAGNOSIS — M6281 Muscle weakness (generalized): Secondary | ICD-10-CM | POA: Diagnosis not present

## 2013-04-07 DIAGNOSIS — M25669 Stiffness of unspecified knee, not elsewhere classified: Secondary | ICD-10-CM | POA: Diagnosis not present

## 2013-04-07 DIAGNOSIS — Z96659 Presence of unspecified artificial knee joint: Secondary | ICD-10-CM | POA: Diagnosis not present

## 2013-04-09 DIAGNOSIS — M6281 Muscle weakness (generalized): Secondary | ICD-10-CM | POA: Diagnosis not present

## 2013-04-09 DIAGNOSIS — Z96659 Presence of unspecified artificial knee joint: Secondary | ICD-10-CM | POA: Diagnosis not present

## 2013-04-09 DIAGNOSIS — M25669 Stiffness of unspecified knee, not elsewhere classified: Secondary | ICD-10-CM | POA: Diagnosis not present

## 2013-04-09 DIAGNOSIS — M25569 Pain in unspecified knee: Secondary | ICD-10-CM | POA: Diagnosis not present

## 2013-04-11 DIAGNOSIS — Z96659 Presence of unspecified artificial knee joint: Secondary | ICD-10-CM | POA: Diagnosis not present

## 2013-04-11 DIAGNOSIS — M6281 Muscle weakness (generalized): Secondary | ICD-10-CM | POA: Diagnosis not present

## 2013-04-11 DIAGNOSIS — M25669 Stiffness of unspecified knee, not elsewhere classified: Secondary | ICD-10-CM | POA: Diagnosis not present

## 2013-04-11 DIAGNOSIS — M25569 Pain in unspecified knee: Secondary | ICD-10-CM | POA: Diagnosis not present

## 2013-04-14 DIAGNOSIS — M25669 Stiffness of unspecified knee, not elsewhere classified: Secondary | ICD-10-CM | POA: Diagnosis not present

## 2013-04-14 DIAGNOSIS — M6281 Muscle weakness (generalized): Secondary | ICD-10-CM | POA: Diagnosis not present

## 2013-04-14 DIAGNOSIS — M25569 Pain in unspecified knee: Secondary | ICD-10-CM | POA: Diagnosis not present

## 2013-04-16 DIAGNOSIS — M25669 Stiffness of unspecified knee, not elsewhere classified: Secondary | ICD-10-CM | POA: Diagnosis not present

## 2013-04-16 DIAGNOSIS — Z96659 Presence of unspecified artificial knee joint: Secondary | ICD-10-CM | POA: Diagnosis not present

## 2013-04-16 DIAGNOSIS — M6281 Muscle weakness (generalized): Secondary | ICD-10-CM | POA: Diagnosis not present

## 2013-04-16 DIAGNOSIS — M25569 Pain in unspecified knee: Secondary | ICD-10-CM | POA: Diagnosis not present

## 2013-04-18 DIAGNOSIS — M6281 Muscle weakness (generalized): Secondary | ICD-10-CM | POA: Diagnosis not present

## 2013-04-18 DIAGNOSIS — M25569 Pain in unspecified knee: Secondary | ICD-10-CM | POA: Diagnosis not present

## 2013-04-18 DIAGNOSIS — M25669 Stiffness of unspecified knee, not elsewhere classified: Secondary | ICD-10-CM | POA: Diagnosis not present

## 2013-04-18 DIAGNOSIS — Z96659 Presence of unspecified artificial knee joint: Secondary | ICD-10-CM | POA: Diagnosis not present

## 2013-04-21 DIAGNOSIS — Z96659 Presence of unspecified artificial knee joint: Secondary | ICD-10-CM | POA: Diagnosis not present

## 2013-04-21 DIAGNOSIS — M25669 Stiffness of unspecified knee, not elsewhere classified: Secondary | ICD-10-CM | POA: Diagnosis not present

## 2013-04-21 DIAGNOSIS — M6281 Muscle weakness (generalized): Secondary | ICD-10-CM | POA: Diagnosis not present

## 2013-04-21 DIAGNOSIS — M25569 Pain in unspecified knee: Secondary | ICD-10-CM | POA: Diagnosis not present

## 2013-04-23 DIAGNOSIS — M25669 Stiffness of unspecified knee, not elsewhere classified: Secondary | ICD-10-CM | POA: Diagnosis not present

## 2013-04-23 DIAGNOSIS — Z96659 Presence of unspecified artificial knee joint: Secondary | ICD-10-CM | POA: Diagnosis not present

## 2013-04-23 DIAGNOSIS — M25569 Pain in unspecified knee: Secondary | ICD-10-CM | POA: Diagnosis not present

## 2013-04-23 DIAGNOSIS — M6281 Muscle weakness (generalized): Secondary | ICD-10-CM | POA: Diagnosis not present

## 2013-04-25 DIAGNOSIS — M6281 Muscle weakness (generalized): Secondary | ICD-10-CM | POA: Diagnosis not present

## 2013-04-25 DIAGNOSIS — M25669 Stiffness of unspecified knee, not elsewhere classified: Secondary | ICD-10-CM | POA: Diagnosis not present

## 2013-04-25 DIAGNOSIS — M25569 Pain in unspecified knee: Secondary | ICD-10-CM | POA: Diagnosis not present

## 2013-04-25 DIAGNOSIS — Z96659 Presence of unspecified artificial knee joint: Secondary | ICD-10-CM | POA: Diagnosis not present

## 2013-04-28 DIAGNOSIS — M25669 Stiffness of unspecified knee, not elsewhere classified: Secondary | ICD-10-CM | POA: Diagnosis not present

## 2013-04-28 DIAGNOSIS — M25569 Pain in unspecified knee: Secondary | ICD-10-CM | POA: Diagnosis not present

## 2013-04-28 DIAGNOSIS — Z96659 Presence of unspecified artificial knee joint: Secondary | ICD-10-CM | POA: Diagnosis not present

## 2013-04-28 DIAGNOSIS — M6281 Muscle weakness (generalized): Secondary | ICD-10-CM | POA: Diagnosis not present

## 2013-04-29 DIAGNOSIS — Z96659 Presence of unspecified artificial knee joint: Secondary | ICD-10-CM | POA: Diagnosis not present

## 2013-04-29 DIAGNOSIS — Z471 Aftercare following joint replacement surgery: Secondary | ICD-10-CM | POA: Diagnosis not present

## 2013-04-30 DIAGNOSIS — M25669 Stiffness of unspecified knee, not elsewhere classified: Secondary | ICD-10-CM | POA: Diagnosis not present

## 2013-04-30 DIAGNOSIS — Z96659 Presence of unspecified artificial knee joint: Secondary | ICD-10-CM | POA: Diagnosis not present

## 2013-04-30 DIAGNOSIS — M25569 Pain in unspecified knee: Secondary | ICD-10-CM | POA: Diagnosis not present

## 2013-04-30 DIAGNOSIS — M6281 Muscle weakness (generalized): Secondary | ICD-10-CM | POA: Diagnosis not present

## 2013-05-06 DIAGNOSIS — M25569 Pain in unspecified knee: Secondary | ICD-10-CM | POA: Diagnosis not present

## 2013-05-06 DIAGNOSIS — M6281 Muscle weakness (generalized): Secondary | ICD-10-CM | POA: Diagnosis not present

## 2013-05-06 DIAGNOSIS — M25669 Stiffness of unspecified knee, not elsewhere classified: Secondary | ICD-10-CM | POA: Diagnosis not present

## 2013-05-06 DIAGNOSIS — Z96659 Presence of unspecified artificial knee joint: Secondary | ICD-10-CM | POA: Diagnosis not present

## 2013-05-08 DIAGNOSIS — M25569 Pain in unspecified knee: Secondary | ICD-10-CM | POA: Diagnosis not present

## 2013-05-08 DIAGNOSIS — M6281 Muscle weakness (generalized): Secondary | ICD-10-CM | POA: Diagnosis not present

## 2013-05-08 DIAGNOSIS — M25669 Stiffness of unspecified knee, not elsewhere classified: Secondary | ICD-10-CM | POA: Diagnosis not present

## 2013-05-08 DIAGNOSIS — Z96659 Presence of unspecified artificial knee joint: Secondary | ICD-10-CM | POA: Diagnosis not present

## 2013-05-12 DIAGNOSIS — M6281 Muscle weakness (generalized): Secondary | ICD-10-CM | POA: Diagnosis not present

## 2013-05-12 DIAGNOSIS — M25569 Pain in unspecified knee: Secondary | ICD-10-CM | POA: Diagnosis not present

## 2013-05-12 DIAGNOSIS — Z96659 Presence of unspecified artificial knee joint: Secondary | ICD-10-CM | POA: Diagnosis not present

## 2013-05-12 DIAGNOSIS — M25669 Stiffness of unspecified knee, not elsewhere classified: Secondary | ICD-10-CM | POA: Diagnosis not present

## 2013-05-14 DIAGNOSIS — M6281 Muscle weakness (generalized): Secondary | ICD-10-CM | POA: Diagnosis not present

## 2013-05-14 DIAGNOSIS — M25569 Pain in unspecified knee: Secondary | ICD-10-CM | POA: Diagnosis not present

## 2013-05-14 DIAGNOSIS — Z96659 Presence of unspecified artificial knee joint: Secondary | ICD-10-CM | POA: Diagnosis not present

## 2013-05-14 DIAGNOSIS — M25669 Stiffness of unspecified knee, not elsewhere classified: Secondary | ICD-10-CM | POA: Diagnosis not present

## 2013-05-16 DIAGNOSIS — Z96659 Presence of unspecified artificial knee joint: Secondary | ICD-10-CM | POA: Diagnosis not present

## 2013-05-16 DIAGNOSIS — M25669 Stiffness of unspecified knee, not elsewhere classified: Secondary | ICD-10-CM | POA: Diagnosis not present

## 2013-05-16 DIAGNOSIS — M25569 Pain in unspecified knee: Secondary | ICD-10-CM | POA: Diagnosis not present

## 2013-05-16 DIAGNOSIS — M6281 Muscle weakness (generalized): Secondary | ICD-10-CM | POA: Diagnosis not present

## 2013-05-19 DIAGNOSIS — M25669 Stiffness of unspecified knee, not elsewhere classified: Secondary | ICD-10-CM | POA: Diagnosis not present

## 2013-05-19 DIAGNOSIS — M6281 Muscle weakness (generalized): Secondary | ICD-10-CM | POA: Diagnosis not present

## 2013-05-19 DIAGNOSIS — Z96659 Presence of unspecified artificial knee joint: Secondary | ICD-10-CM | POA: Diagnosis not present

## 2013-05-19 DIAGNOSIS — M25569 Pain in unspecified knee: Secondary | ICD-10-CM | POA: Diagnosis not present

## 2013-05-21 DIAGNOSIS — M25669 Stiffness of unspecified knee, not elsewhere classified: Secondary | ICD-10-CM | POA: Diagnosis not present

## 2013-05-21 DIAGNOSIS — Z96659 Presence of unspecified artificial knee joint: Secondary | ICD-10-CM | POA: Diagnosis not present

## 2013-05-21 DIAGNOSIS — M25569 Pain in unspecified knee: Secondary | ICD-10-CM | POA: Diagnosis not present

## 2013-05-21 DIAGNOSIS — M6281 Muscle weakness (generalized): Secondary | ICD-10-CM | POA: Diagnosis not present

## 2013-05-26 DIAGNOSIS — M25569 Pain in unspecified knee: Secondary | ICD-10-CM | POA: Diagnosis not present

## 2013-05-26 DIAGNOSIS — M6281 Muscle weakness (generalized): Secondary | ICD-10-CM | POA: Diagnosis not present

## 2013-05-26 DIAGNOSIS — M25669 Stiffness of unspecified knee, not elsewhere classified: Secondary | ICD-10-CM | POA: Diagnosis not present

## 2013-05-26 DIAGNOSIS — Z96659 Presence of unspecified artificial knee joint: Secondary | ICD-10-CM | POA: Diagnosis not present

## 2013-07-28 DIAGNOSIS — D239 Other benign neoplasm of skin, unspecified: Secondary | ICD-10-CM | POA: Diagnosis not present

## 2013-07-28 DIAGNOSIS — L821 Other seborrheic keratosis: Secondary | ICD-10-CM | POA: Diagnosis not present

## 2013-10-28 DIAGNOSIS — Z0000 Encounter for general adult medical examination without abnormal findings: Secondary | ICD-10-CM | POA: Diagnosis not present

## 2013-10-28 DIAGNOSIS — Z23 Encounter for immunization: Secondary | ICD-10-CM | POA: Diagnosis not present

## 2013-10-28 DIAGNOSIS — R7301 Impaired fasting glucose: Secondary | ICD-10-CM | POA: Diagnosis not present

## 2013-10-28 DIAGNOSIS — R002 Palpitations: Secondary | ICD-10-CM | POA: Diagnosis not present

## 2013-10-28 DIAGNOSIS — E7800 Pure hypercholesterolemia, unspecified: Secondary | ICD-10-CM | POA: Diagnosis not present

## 2013-10-30 DIAGNOSIS — H2510 Age-related nuclear cataract, unspecified eye: Secondary | ICD-10-CM | POA: Diagnosis not present

## 2013-10-30 DIAGNOSIS — H5210 Myopia, unspecified eye: Secondary | ICD-10-CM | POA: Diagnosis not present

## 2013-11-03 ENCOUNTER — Telehealth: Payer: Self-pay | Admitting: Cardiology

## 2013-11-03 ENCOUNTER — Other Ambulatory Visit: Payer: Self-pay | Admitting: *Deleted

## 2013-11-03 DIAGNOSIS — I491 Atrial premature depolarization: Secondary | ICD-10-CM

## 2013-11-03 DIAGNOSIS — R002 Palpitations: Secondary | ICD-10-CM

## 2013-11-03 NOTE — Telephone Encounter (Signed)
Please call the patient to be scheduled to see Dr Marlou Porch at the pt's request.

## 2013-11-03 NOTE — Telephone Encounter (Signed)
New message          Pt would like a second opinion about a holter monitor / can you add pt on to skains schedule / seen dr Marlou Porch in Sept 2014 per lisa

## 2013-11-10 NOTE — Telephone Encounter (Signed)
Follow up    Patient is calling asking why he need to have an Holter monitor.

## 2013-11-10 NOTE — Telephone Encounter (Signed)
lmtcb

## 2013-11-11 NOTE — Telephone Encounter (Signed)
Pt called because he states wants to know why he needs a Holter monitor. Pt  does not have any symptoms. Pt denies palpitations,nor syncope episodes. Pt was advised to call his PCP and find out the reason he order this monitor. Pt verbalized understanding.

## 2013-11-17 ENCOUNTER — Other Ambulatory Visit: Payer: Self-pay | Admitting: *Deleted

## 2013-11-17 DIAGNOSIS — R002 Palpitations: Secondary | ICD-10-CM

## 2013-11-19 ENCOUNTER — Other Ambulatory Visit: Payer: Self-pay | Admitting: *Deleted

## 2013-11-19 DIAGNOSIS — R002 Palpitations: Secondary | ICD-10-CM

## 2013-11-19 DIAGNOSIS — I491 Atrial premature depolarization: Secondary | ICD-10-CM

## 2013-11-26 ENCOUNTER — Encounter (INDEPENDENT_AMBULATORY_CARE_PROVIDER_SITE_OTHER): Payer: Medicare Other

## 2013-11-26 ENCOUNTER — Encounter: Payer: Self-pay | Admitting: *Deleted

## 2013-11-26 DIAGNOSIS — R002 Palpitations: Secondary | ICD-10-CM | POA: Diagnosis not present

## 2013-11-26 DIAGNOSIS — I491 Atrial premature depolarization: Secondary | ICD-10-CM

## 2013-11-26 NOTE — Progress Notes (Signed)
Patient ID: Isaac [name], male   DOB: Oct 07, 1945, 67 y.o.   MRN: [id number] E-cardio 24 hour holter monitor applied to patient.

## 2013-12-10 ENCOUNTER — Telehealth: Payer: Self-pay | Admitting: *Deleted

## 2013-12-10 NOTE — Telephone Encounter (Signed)
Attempted to call pt with results of 24 hr monitor that demonstrated PVCs and PACs which are causing his palpitations.  Per Dr Marlou Porch no adverse rhythm.  Left message to call back at home number.  Also attempted to contact him at work.

## 2013-12-12 NOTE — Telephone Encounter (Signed)
Follow up        Pt calling back about results / spoke with [employer] in triage she said he could wait and she will call him if she can / pt is requesting call from triage nurse

## 2013-12-12 NOTE — Telephone Encounter (Signed)
Pt notified. Pt states he is feeling great.

## 2014-03-20 DIAGNOSIS — L218 Other seborrheic dermatitis: Secondary | ICD-10-CM | POA: Diagnosis not present

## 2014-04-07 DIAGNOSIS — M1711 Unilateral primary osteoarthritis, right knee: Secondary | ICD-10-CM | POA: Diagnosis not present

## 2014-04-18 DIAGNOSIS — Z23 Encounter for immunization: Secondary | ICD-10-CM | POA: Diagnosis not present

## 2014-04-20 DIAGNOSIS — M7062 Trochanteric bursitis, left hip: Secondary | ICD-10-CM | POA: Diagnosis not present

## 2014-04-20 IMAGING — CR DG KNEE 1-2V*R*
2 series · 2 of 2 positions shown · non-contrast
Comparison: None.

CLINICAL DATA: Postoperative study.

EXAM:
RIGHT KNEE - 1-2 VIEW

[AP]
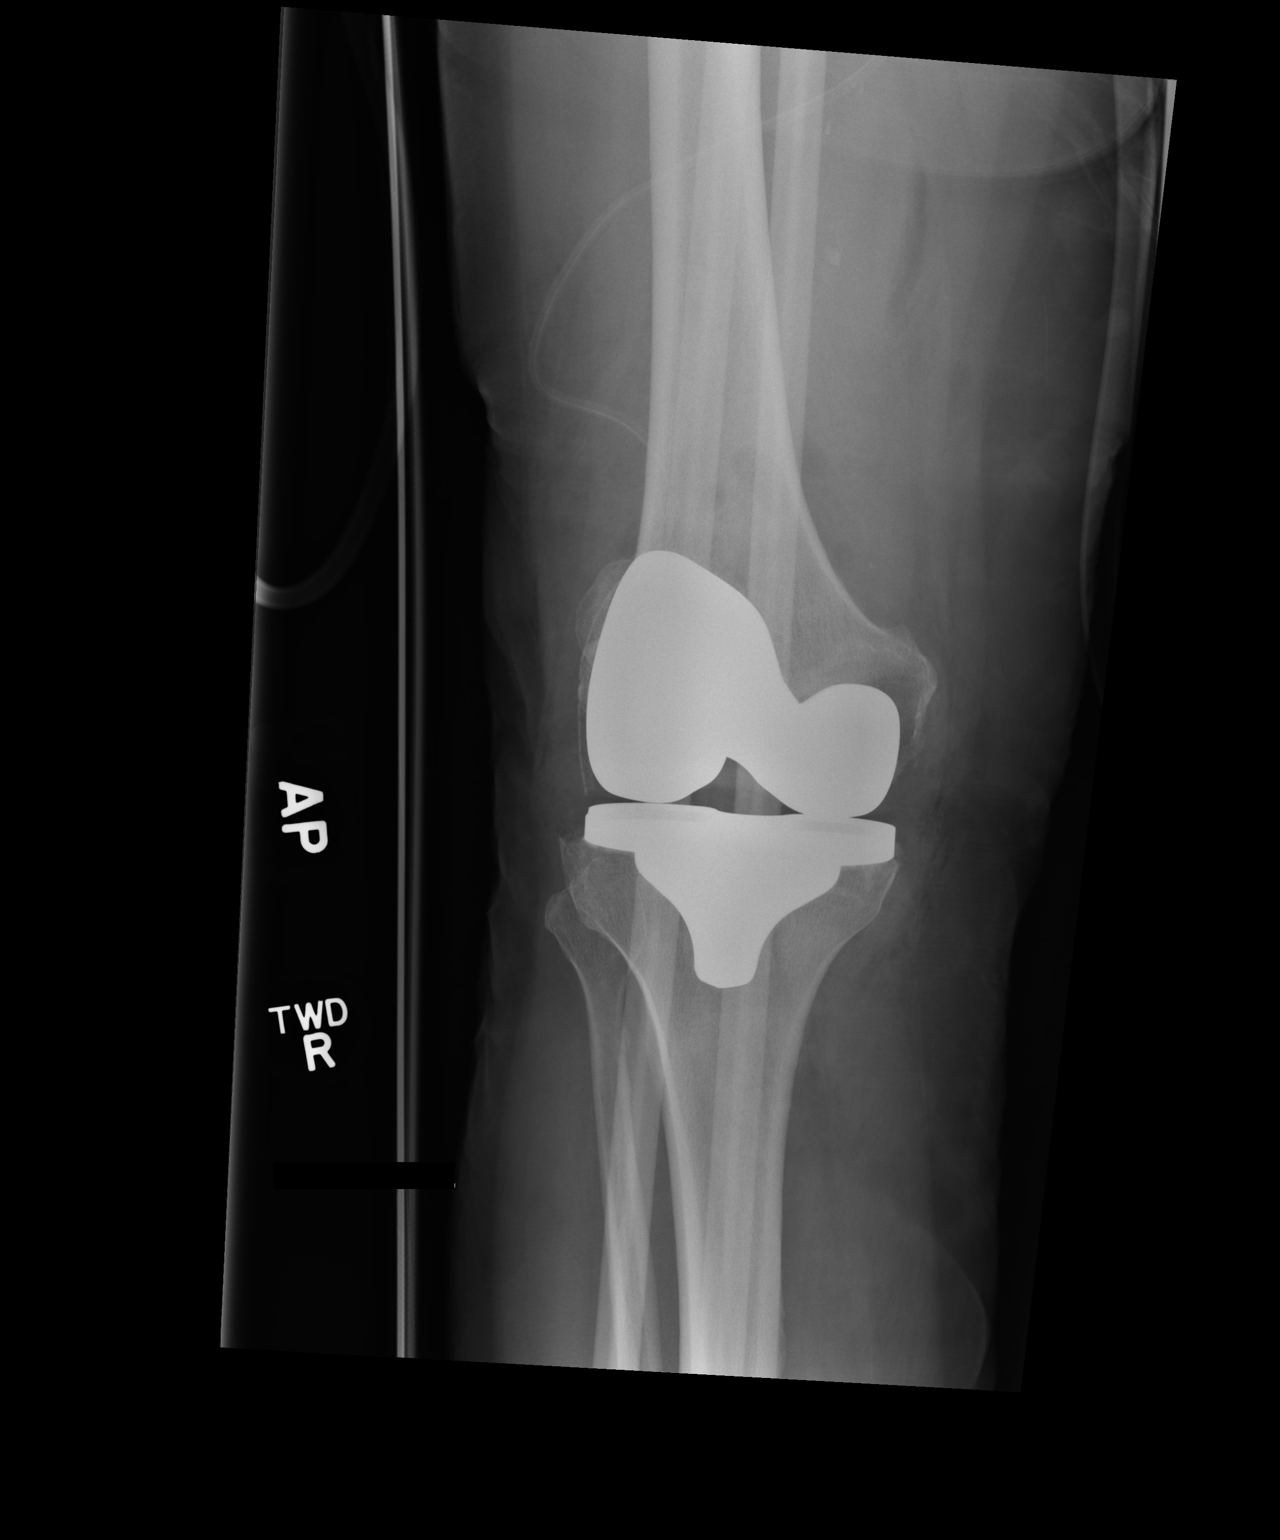

[xtable lateral]
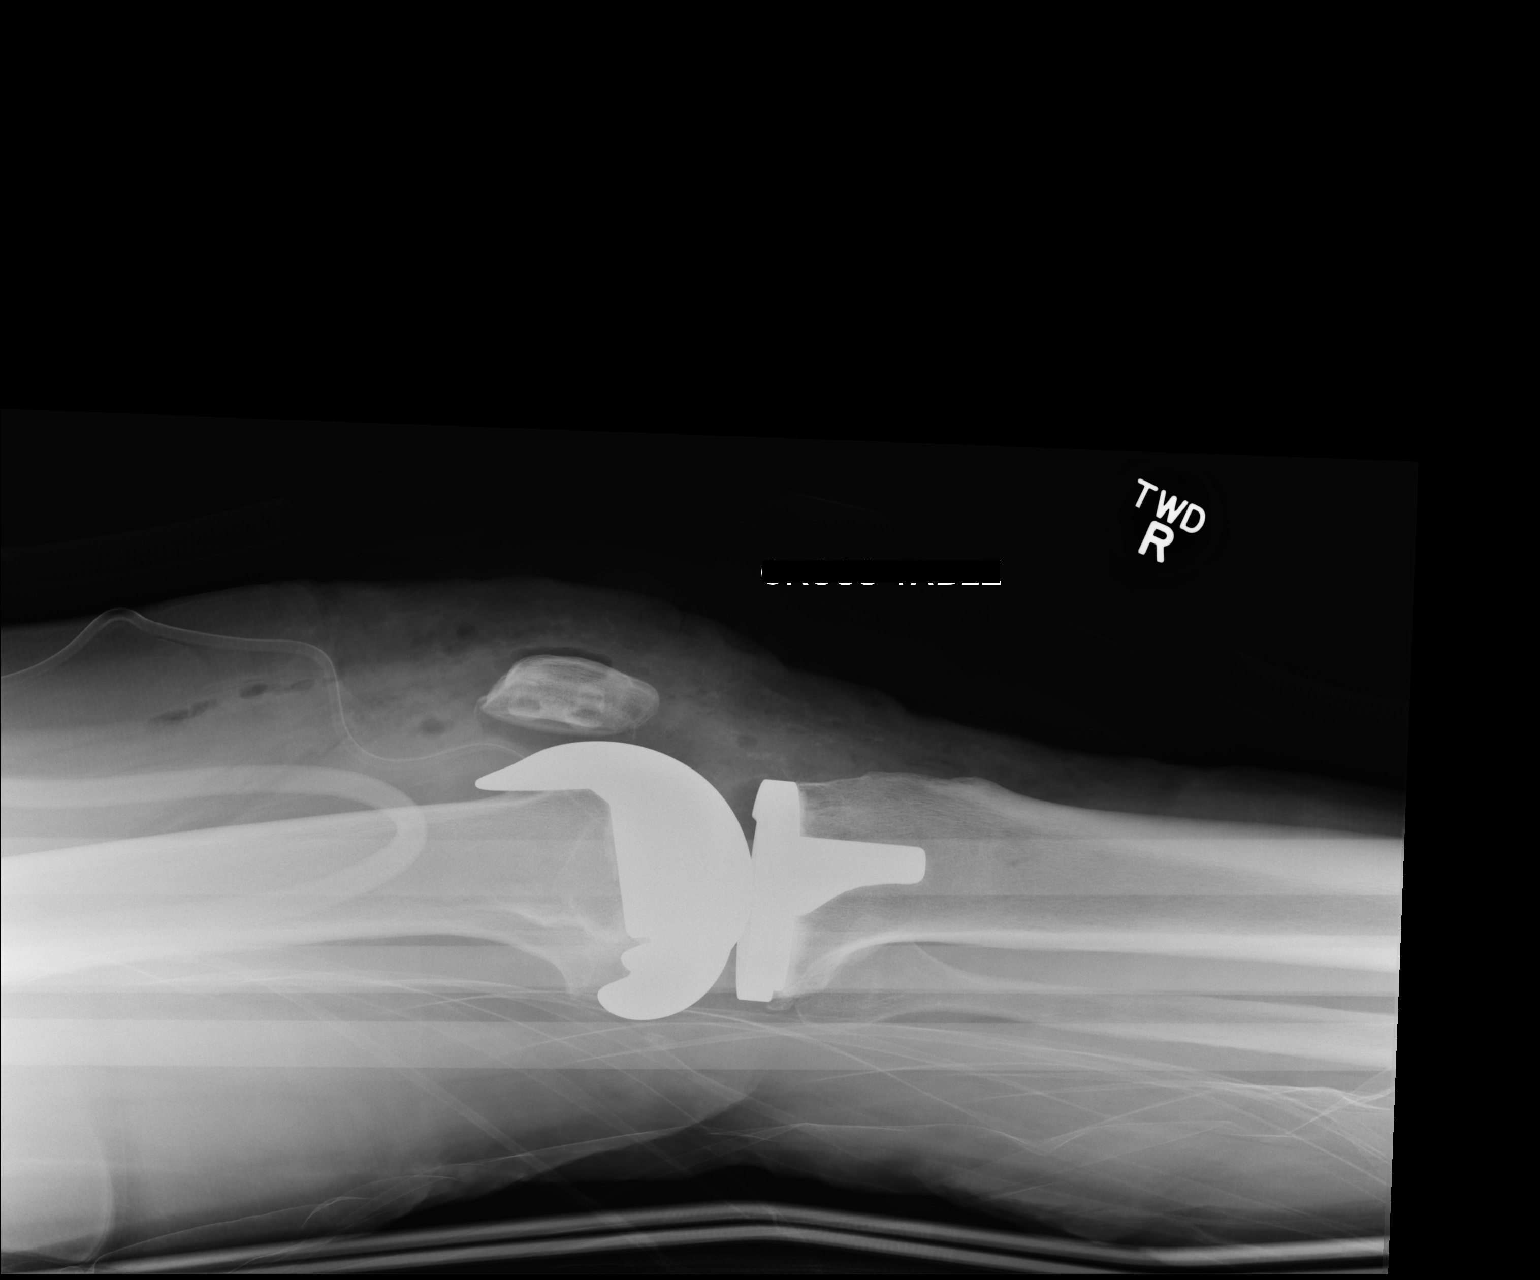

[2 of 2 positions shown; findings below may reference images not displayed]

FINDINGS: Postoperative changes of right total knee arthroplasty are noted.
The femoral and tibial components of the prosthesis appear properly
seated without definite periprosthetic fracture or other immediate
complicating features. A surgical drain is in place, and there is
gas within the joint space and the overlying soft tissues.
IMPRESSION: 1. Postoperative changes of recent right total knee arthroplasty, as
above.

## 2014-10-30 DIAGNOSIS — Z0000 Encounter for general adult medical examination without abnormal findings: Secondary | ICD-10-CM | POA: Diagnosis not present

## 2014-10-30 DIAGNOSIS — R7309 Other abnormal glucose: Secondary | ICD-10-CM | POA: Diagnosis not present

## 2014-10-30 DIAGNOSIS — E780 Pure hypercholesterolemia: Secondary | ICD-10-CM | POA: Diagnosis not present

## 2014-11-03 ENCOUNTER — Encounter (HOSPITAL_COMMUNITY): Payer: Self-pay | Admitting: Emergency Medicine

## 2014-11-03 ENCOUNTER — Emergency Department (HOSPITAL_COMMUNITY): Payer: No Typology Code available for payment source

## 2014-11-03 ENCOUNTER — Emergency Department (HOSPITAL_COMMUNITY)
Admission: EM | Admit: 2014-11-03 | Discharge: 2014-11-03 | Disposition: A | Discharge status: Home / Self Care | Payer: No Typology Code available for payment source | Attending: Emergency Medicine | Admitting: Emergency Medicine

## 2014-11-03 DIAGNOSIS — S161XXA Strain of muscle, fascia and tendon at neck level, initial encounter: Secondary | ICD-10-CM | POA: Insufficient documentation

## 2014-11-03 DIAGNOSIS — Y92410 Unspecified street and highway as the place of occurrence of the external cause: Secondary | ICD-10-CM | POA: Insufficient documentation

## 2014-11-03 DIAGNOSIS — Z87891 Personal history of nicotine dependence: Secondary | ICD-10-CM | POA: Diagnosis not present

## 2014-11-03 DIAGNOSIS — M25512 Pain in left shoulder: Secondary | ICD-10-CM | POA: Diagnosis not present

## 2014-11-03 DIAGNOSIS — M7989 Other specified soft tissue disorders: Secondary | ICD-10-CM | POA: Diagnosis not present

## 2014-11-03 DIAGNOSIS — S0990XA Unspecified injury of head, initial encounter: Secondary | ICD-10-CM

## 2014-11-03 DIAGNOSIS — M542 Cervicalgia: Secondary | ICD-10-CM | POA: Diagnosis not present

## 2014-11-03 DIAGNOSIS — S99912A Unspecified injury of left ankle, initial encounter: Secondary | ICD-10-CM | POA: Diagnosis not present

## 2014-11-03 DIAGNOSIS — S93402A Sprain of unspecified ligament of left ankle, initial encounter: Secondary | ICD-10-CM

## 2014-11-03 DIAGNOSIS — Y9389 Activity, other specified: Secondary | ICD-10-CM | POA: Insufficient documentation

## 2014-11-03 DIAGNOSIS — M25572 Pain in left ankle and joints of left foot: Secondary | ICD-10-CM | POA: Diagnosis not present

## 2014-11-03 DIAGNOSIS — M1990 Unspecified osteoarthritis, unspecified site: Secondary | ICD-10-CM | POA: Insufficient documentation

## 2014-11-03 DIAGNOSIS — S4992XA Unspecified injury of left shoulder and upper arm, initial encounter: Secondary | ICD-10-CM | POA: Diagnosis not present

## 2014-11-03 DIAGNOSIS — S199XXA Unspecified injury of neck, initial encounter: Secondary | ICD-10-CM | POA: Diagnosis present

## 2014-11-03 DIAGNOSIS — Z792 Long term (current) use of antibiotics: Secondary | ICD-10-CM | POA: Diagnosis not present

## 2014-11-03 DIAGNOSIS — R51 Headache: Secondary | ICD-10-CM | POA: Diagnosis not present

## 2014-11-03 DIAGNOSIS — Y998 Other external cause status: Secondary | ICD-10-CM | POA: Insufficient documentation

## 2014-11-03 DIAGNOSIS — S99922A Unspecified injury of left foot, initial encounter: Secondary | ICD-10-CM | POA: Diagnosis not present

## 2014-11-03 DIAGNOSIS — M79672 Pain in left foot: Secondary | ICD-10-CM | POA: Diagnosis not present

## 2014-11-03 NOTE — ED Notes (Signed)
Bed: WA20 Expected date:  Expected time:  Means of arrival:  Comments: 
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
## 2014-11-03 NOTE — Discharge Instructions (Signed)
Ankle Sprain °An ankle sprain is an injury to the strong, fibrous tissues (ligaments) that hold the bones of your ankle joint together.  °CAUSES °An ankle sprain is usually caused by a fall or by twisting your ankle. Ankle sprains most commonly occur when you step on the outer edge of your foot, and your ankle turns inward. People who participate in sports are more prone to these types of injuries.  °SYMPTOMS  °· Pain in your ankle. The pain may be present at rest or only when you are trying to stand or walk. °· Swelling. °· Bruising. Bruising may develop immediately or within 1 to 2 days after your injury. °· Difficulty standing or walking, particularly when turning corners or changing directions. °DIAGNOSIS  °Your caregiver will ask you details about your injury and perform a physical exam of your ankle to determine if you have an ankle sprain. During the physical exam, your caregiver will press on and apply pressure to specific areas of your foot and ankle. Your caregiver will try to move your ankle in certain ways. An X-ray exam may be done to be sure a bone was not broken or a ligament did not separate from one of the bones in your ankle (avulsion fracture).  °TREATMENT  °Certain types of braces can help stabilize your ankle. Your caregiver can make a recommendation for this. Your caregiver may recommend the use of medicine for pain. If your sprain is severe, your caregiver may refer you to a surgeon who helps to restore function to parts of your skeletal system (orthopedist) or a physical therapist. °HOME CARE INSTRUCTIONS  °· Apply ice to your injury for 1-2 days or as directed by your caregiver. Applying ice helps to reduce inflammation and pain. °· Put ice in a plastic bag. °· Place a towel between your skin and the bag. °· Leave the ice on for 15-20 minutes at a time, every 2 hours while you are awake. °· Only take over-the-counter or prescription medicines for pain, discomfort, or fever as directed by  your caregiver. °· Elevate your injured ankle above the level of your heart as much as possible for 2-3 days. °· If your caregiver recommends crutches, use them as instructed. Gradually put weight on the affected ankle. Continue to use crutches or a cane until you can walk without feeling pain in your ankle. °· If you have a plaster splint, wear the splint as directed by your caregiver. Do not rest it on anything harder than a pillow for the first 24 hours. Do not put weight on it. Do not get it wet. You may take it off to take a shower or bath. °· You may have been given an elastic bandage to wear around your ankle to provide support. If the elastic bandage is too tight (you have numbness or tingling in your foot or your foot becomes cold and blue), adjust the bandage to make it comfortable. °· If you have an air splint, you may blow more air into it or let air out to make it more comfortable. You may take your splint off at night and before taking a shower or bath. Wiggle your toes in the splint several times per day to decrease swelling. °SEEK MEDICAL CARE IF:  °· You have rapidly increasing bruising or swelling. °· Your toes feel extremely cold or you lose feeling in your foot. °· Your pain is not relieved with medicine. °SEEK IMMEDIATE MEDICAL CARE IF: °· Your toes are numb or blue. °·   You have severe pain that is increasing. MAKE SURE YOU:   Understand these instructions.  Will watch your condition.  Will get help right away if you are not doing well or get worse. Document Released: 05/22/2005 Document Revised: 02/14/2012 Document Reviewed: 06/03/2011 [facility] Patient Information 2015 [place], [place]. This information is not intended to replace advice given to you by your health care provider. Make sure you discuss any questions you have with your health care provider.  Cervical Sprain A cervical sprain is an injury in the neck in which the strong, fibrous tissues (ligaments) that connect your neck  bones stretch or tear. Cervical sprains can range from mild to severe. Severe cervical sprains can cause the neck vertebrae to be unstable. This can lead to damage of the spinal cord and can result in serious nervous system problems. The amount of time it takes for a cervical sprain to get better depends on the cause and extent of the injury. Most cervical sprains heal in 1 to 3 weeks. CAUSES  Severe cervical sprains may be caused by:   Contact sport injuries (such as from football, rugby, wrestling, hockey, auto racing, gymnastics, diving, martial arts, or boxing).   Motor vehicle collisions.   Whiplash injuries. This is an injury from a sudden forward and backward whipping movement of the head and neck.  Falls.  Mild cervical sprains may be caused by:   Being in an awkward position, such as while cradling a telephone between your ear and shoulder.   Sitting in a chair that does not offer proper support.   Working at a poorly Landscape [employer] station.   Looking up or down for long periods of time.  SYMPTOMS   Pain, soreness, stiffness, or a burning sensation in the front, back, or sides of the neck. This discomfort may develop immediately after the injury or slowly, 24 hours or more after the injury.   Pain or tenderness directly in the middle of the back of the neck.   Shoulder or upper back pain.   Limited ability to move the neck.   Headache.   Dizziness.   Weakness, numbness, or tingling in the hands or arms.   Muscle spasms.   Difficulty swallowing or chewing.   Tenderness and swelling of the neck.  DIAGNOSIS  Most of the time your health care provider can diagnose a cervical sprain by taking your history and doing a physical exam. Your health care provider will ask about previous neck injuries and any known neck problems, such as arthritis in the neck. X-rays may be taken to find out if there are any other problems, such as with the bones of the  neck. Other tests, such as a CT scan or MRI, may also be needed.  TREATMENT  Treatment depends on the severity of the cervical sprain. Mild sprains can be treated with rest, keeping the neck in place (immobilization), and pain medicines. Severe cervical sprains are immediately immobilized. Further treatment is done to help with pain, muscle spasms, and other symptoms and may include:  Medicines, such as pain relievers, numbing medicines, or muscle relaxants.   Physical therapy. This may involve stretching exercises, strengthening exercises, and posture training. Exercises and improved posture can help stabilize the neck, strengthen muscles, and help stop symptoms from returning.  HOME CARE INSTRUCTIONS   Put ice on the injured area.   Put ice in a plastic bag.   Place a towel between your skin and the bag.   Leave the ice  on for 15-20 minutes, 3-4 times a day.   If your injury was severe, you may have been given a cervical collar to wear. A cervical collar is a two-piece collar designed to keep your neck from moving while it heals.  Do not remove the collar unless instructed by your health care provider.  If you have long hair, keep it outside of the collar.  Ask your health care provider before making any adjustments to your collar. Minor adjustments may be required over time to improve comfort and reduce pressure on your chin or on the back of your head.  Ifyou are allowed to remove the collar for cleaning or bathing, follow your health care provider's instructions on how to do so safely.  Keep your collar clean by wiping it with mild soap and water and drying it completely. If the collar you have been given includes removable pads, remove them every 1-2 days and hand wash them with soap and water. Allow them to air dry. They should be completely dry before you wear them in the collar.  If you are allowed to remove the collar for cleaning and bathing, wash and dry the skin of  your neck. Check your skin for irritation or sores. If you see any, tell your health care provider.  Do not drive while wearing the collar.   Only take over-the-counter or prescription medicines for pain, discomfort, or fever as directed by your health care provider.   Keep all follow-up appointments as directed by your health care provider.   Keep all physical therapy appointments as directed by your health care provider.   Make any needed adjustments to your workstation to promote good posture.   Avoid positions and activities that make your symptoms worse.   Warm up and stretch before being active to help prevent problems.  SEEK MEDICAL CARE IF:   Your pain is not controlled with medicine.   You are unable to decrease your pain medicine over time as planned.   Your activity level is not improving as expected.  SEEK IMMEDIATE MEDICAL CARE IF:   You develop any bleeding.  You develop stomach upset.  You have signs of an allergic reaction to your medicine.   Your symptoms get worse.   You develop new, unexplained symptoms.   You have numbness, tingling, weakness, or paralysis in any part of your body.  MAKE SURE YOU:   Understand these instructions.  Will watch your condition.  Will get help right away if you are not doing well or get worse. Document Released: 03/19/2007 Document Revised: 05/27/2013 Document Reviewed: 11/27/2012 [facility] Patient Information 2015 [place], [place]. This information is not intended to replace advice given to you by your health care provider. Make sure you discuss any questions you have with your health care provider.  Head Injury You have received a head injury. It does not appear serious at this time. Headaches and vomiting are common following head injury. It should be easy to awaken from sleeping. Sometimes it is necessary for you to stay in the emergency department for a while for observation. Sometimes admission to the  hospital may be needed. After injuries such as yours, most problems occur within the first 24 hours, but side effects may occur up to 7-10 days after the injury. It is important for you to carefully monitor your condition and contact your health care provider or seek immediate medical care if there is a change in your condition. WHAT ARE THE TYPES OF HEAD  INJURIES? Head injuries can be as minor as a bump. Some head injuries can be more severe. More severe head injuries include:  A jarring injury to the brain (concussion).  A bruise of the brain (contusion). This mean there is bleeding in the brain that can cause swelling.  A cracked skull (skull fracture).  Bleeding in the brain that collects, clots, and forms a bump (hematoma). WHAT CAUSES A HEAD INJURY? A serious head injury is most likely to happen to someone who is in a car wreck and is not wearing a seat belt. Other causes of major head injuries include bicycle or motorcycle accidents, sports injuries, and falls. HOW ARE HEAD INJURIES DIAGNOSED? A complete history of the event leading to the injury and your current symptoms will be helpful in diagnosing head injuries. Many times, pictures of the brain, such as CT or MRI are needed to see the extent of the injury. Often, an overnight hospital stay is necessary for observation.  WHEN SHOULD I SEEK IMMEDIATE MEDICAL CARE?  You should get help right away if:  You have confusion or drowsiness.  You feel sick to your stomach (nauseous) or have continued, forceful vomiting.  You have dizziness or unsteadiness that is getting worse.  You have severe, continued headaches not relieved by medicine. Only take over-the-counter or prescription medicines for pain, fever, or discomfort as directed by your health care provider.  You do not have normal function of the arms or legs or are unable to walk.  You notice changes in the black spots in the center of the colored part of your eye  (pupil).  You have a clear or bloody fluid coming from your nose or ears.  You have a loss of vision. During the next 24 hours after the injury, you must stay with someone who can watch you for the warning signs. This person should contact local emergency services (911 in the [place] if you have seizures, you become unconscious, or you are unable to wake up. HOW CAN I PREVENT A HEAD INJURY IN THE FUTURE? The most important factor for preventing major head injuries is avoiding motor vehicle accidents. To minimize the potential for damage to your head, it is crucial to wear seat belts while riding in motor vehicles. Wearing helmets while bike riding and playing collision sports (like football) is also helpful. Also, avoiding dangerous activities around the house will further help reduce your risk of head injury.  WHEN CAN I RETURN TO NORMAL ACTIVITIES AND ATHLETICS? You should be reevaluated by your health care provider before returning to these activities. If you have any of the following symptoms, you should not return to activities or contact sports until 1 week after the symptoms have stopped:  Persistent headache.  Dizziness or vertigo.  Poor attention and concentration.  Confusion.  Memory problems.  Nausea or vomiting.  Fatigue or tire easily.  Irritability.  Intolerant of bright lights or loud noises.  Anxiety or depression.  Disturbed sleep. MAKE SURE YOU:   Understand these instructions.  Will watch your condition.  Will get help right away if you are not doing well or get worse. Document Released: 05/22/2005 Document Revised: 05/27/2013 Document Reviewed: 01/27/2013 [facility] Patient Information 2015 [place], [place]. This information is not intended to replace advice given to you by your health care provider. Make sure you discuss any questions you have with your health care provider.

## 2014-11-03 NOTE — ED Notes (Signed)
Patient transported to CT 
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
## 2014-11-06 DIAGNOSIS — M542 Cervicalgia: Secondary | ICD-10-CM | POA: Diagnosis not present

## 2014-11-06 DIAGNOSIS — M79605 Pain in left leg: Secondary | ICD-10-CM | POA: Diagnosis not present

## 2014-11-06 DIAGNOSIS — M549 Dorsalgia, unspecified: Secondary | ICD-10-CM | POA: Diagnosis not present

## 2014-11-10 DIAGNOSIS — M25562 Pain in left knee: Secondary | ICD-10-CM | POA: Diagnosis not present

## 2014-11-10 DIAGNOSIS — M7062 Trochanteric bursitis, left hip: Secondary | ICD-10-CM | POA: Diagnosis not present

## 2014-11-10 DIAGNOSIS — M1711 Unilateral primary osteoarthritis, right knee: Secondary | ICD-10-CM | POA: Diagnosis not present

## 2014-11-11 DIAGNOSIS — M542 Cervicalgia: Secondary | ICD-10-CM | POA: Diagnosis not present

## 2014-11-11 DIAGNOSIS — M545 Low back pain: Secondary | ICD-10-CM | POA: Diagnosis not present

## 2014-11-17 DIAGNOSIS — M545 Low back pain: Secondary | ICD-10-CM | POA: Diagnosis not present

## 2014-11-17 DIAGNOSIS — M542 Cervicalgia: Secondary | ICD-10-CM | POA: Diagnosis not present

## 2014-11-17 DIAGNOSIS — L738 Other specified follicular disorders: Secondary | ICD-10-CM | POA: Diagnosis not present

## 2014-11-19 DIAGNOSIS — M545 Low back pain: Secondary | ICD-10-CM | POA: Diagnosis not present

## 2014-11-19 DIAGNOSIS — M542 Cervicalgia: Secondary | ICD-10-CM | POA: Diagnosis not present

## 2014-11-24 DIAGNOSIS — M542 Cervicalgia: Secondary | ICD-10-CM | POA: Diagnosis not present

## 2014-11-24 DIAGNOSIS — M545 Low back pain: Secondary | ICD-10-CM | POA: Diagnosis not present

## 2014-11-26 DIAGNOSIS — M545 Low back pain: Secondary | ICD-10-CM | POA: Diagnosis not present

## 2014-11-26 DIAGNOSIS — M542 Cervicalgia: Secondary | ICD-10-CM | POA: Diagnosis not present

## 2014-12-01 DIAGNOSIS — M542 Cervicalgia: Secondary | ICD-10-CM | POA: Diagnosis not present

## 2014-12-01 DIAGNOSIS — M545 Low back pain: Secondary | ICD-10-CM | POA: Diagnosis not present

## 2014-12-03 DIAGNOSIS — M545 Low back pain: Secondary | ICD-10-CM | POA: Diagnosis not present

## 2014-12-03 DIAGNOSIS — M542 Cervicalgia: Secondary | ICD-10-CM | POA: Diagnosis not present

## 2014-12-10 DIAGNOSIS — M542 Cervicalgia: Secondary | ICD-10-CM | POA: Diagnosis not present

## 2014-12-10 DIAGNOSIS — M545 Low back pain: Secondary | ICD-10-CM | POA: Diagnosis not present

## 2014-12-15 DIAGNOSIS — M545 Low back pain: Secondary | ICD-10-CM | POA: Diagnosis not present

## 2014-12-15 DIAGNOSIS — M542 Cervicalgia: Secondary | ICD-10-CM | POA: Diagnosis not present

## 2014-12-17 DIAGNOSIS — M545 Low back pain: Secondary | ICD-10-CM | POA: Diagnosis not present

## 2014-12-17 DIAGNOSIS — M542 Cervicalgia: Secondary | ICD-10-CM | POA: Diagnosis not present

## 2014-12-22 DIAGNOSIS — M545 Low back pain: Secondary | ICD-10-CM | POA: Diagnosis not present

## 2014-12-22 DIAGNOSIS — M542 Cervicalgia: Secondary | ICD-10-CM | POA: Diagnosis not present

## 2014-12-24 DIAGNOSIS — M542 Cervicalgia: Secondary | ICD-10-CM | POA: Diagnosis not present

## 2014-12-24 DIAGNOSIS — M545 Low back pain: Secondary | ICD-10-CM | POA: Diagnosis not present

## 2014-12-29 DIAGNOSIS — M545 Low back pain: Secondary | ICD-10-CM | POA: Diagnosis not present

## 2014-12-29 DIAGNOSIS — M542 Cervicalgia: Secondary | ICD-10-CM | POA: Diagnosis not present

## 2015-01-05 DIAGNOSIS — M545 Low back pain: Secondary | ICD-10-CM | POA: Diagnosis not present

## 2015-01-05 DIAGNOSIS — M542 Cervicalgia: Secondary | ICD-10-CM | POA: Diagnosis not present

## 2015-01-06 DIAGNOSIS — M5137 Other intervertebral disc degeneration, lumbosacral region: Secondary | ICD-10-CM | POA: Diagnosis not present

## 2015-01-06 DIAGNOSIS — M542 Cervicalgia: Secondary | ICD-10-CM | POA: Diagnosis not present

## 2015-01-06 DIAGNOSIS — M545 Low back pain: Secondary | ICD-10-CM | POA: Diagnosis not present

## 2015-01-08 DIAGNOSIS — M545 Low back pain: Secondary | ICD-10-CM | POA: Diagnosis not present

## 2015-01-08 DIAGNOSIS — M542 Cervicalgia: Secondary | ICD-10-CM | POA: Diagnosis not present

## 2015-01-11 DIAGNOSIS — M545 Low back pain: Secondary | ICD-10-CM | POA: Diagnosis not present

## 2015-01-11 DIAGNOSIS — M542 Cervicalgia: Secondary | ICD-10-CM | POA: Diagnosis not present

## 2015-01-12 DIAGNOSIS — M545 Low back pain: Secondary | ICD-10-CM | POA: Diagnosis not present

## 2015-01-14 DIAGNOSIS — M542 Cervicalgia: Secondary | ICD-10-CM | POA: Diagnosis not present

## 2015-01-14 DIAGNOSIS — M545 Low back pain: Secondary | ICD-10-CM | POA: Diagnosis not present

## 2015-01-22 DIAGNOSIS — M5137 Other intervertebral disc degeneration, lumbosacral region: Secondary | ICD-10-CM | POA: Diagnosis not present

## 2015-01-22 DIAGNOSIS — M545 Low back pain: Secondary | ICD-10-CM | POA: Diagnosis not present

## 2015-01-27 DIAGNOSIS — M545 Low back pain: Secondary | ICD-10-CM | POA: Diagnosis not present

## 2015-01-27 DIAGNOSIS — M542 Cervicalgia: Secondary | ICD-10-CM | POA: Diagnosis not present

## 2015-02-02 DIAGNOSIS — M545 Low back pain: Secondary | ICD-10-CM | POA: Diagnosis not present

## 2015-02-02 DIAGNOSIS — M542 Cervicalgia: Secondary | ICD-10-CM | POA: Diagnosis not present

## 2015-02-04 DIAGNOSIS — M545 Low back pain: Secondary | ICD-10-CM | POA: Diagnosis not present

## 2015-02-04 DIAGNOSIS — M542 Cervicalgia: Secondary | ICD-10-CM | POA: Diagnosis not present

## 2015-02-09 DIAGNOSIS — M542 Cervicalgia: Secondary | ICD-10-CM | POA: Diagnosis not present

## 2015-02-09 DIAGNOSIS — M545 Low back pain: Secondary | ICD-10-CM | POA: Diagnosis not present

## 2015-02-09 DIAGNOSIS — M5137 Other intervertebral disc degeneration, lumbosacral region: Secondary | ICD-10-CM | POA: Diagnosis not present

## 2015-03-19 DIAGNOSIS — Z23 Encounter for immunization: Secondary | ICD-10-CM | POA: Diagnosis not present

## 2015-06-11 DIAGNOSIS — J069 Acute upper respiratory infection, unspecified: Secondary | ICD-10-CM | POA: Diagnosis not present

## 2015-08-03 DIAGNOSIS — R05 Cough: Secondary | ICD-10-CM | POA: Diagnosis not present

## 2015-11-22 DIAGNOSIS — Z23 Encounter for immunization: Secondary | ICD-10-CM | POA: Diagnosis not present

## 2015-11-23 DIAGNOSIS — H2513 Age-related nuclear cataract, bilateral: Secondary | ICD-10-CM | POA: Diagnosis not present

## 2015-11-30 DIAGNOSIS — Z6834 Body mass index (BMI) 34.0-34.9, adult: Secondary | ICD-10-CM | POA: Diagnosis not present

## 2015-11-30 DIAGNOSIS — R7309 Other abnormal glucose: Secondary | ICD-10-CM | POA: Diagnosis not present

## 2015-11-30 DIAGNOSIS — Z0000 Encounter for general adult medical examination without abnormal findings: Secondary | ICD-10-CM | POA: Diagnosis not present

## 2015-11-30 DIAGNOSIS — E785 Hyperlipidemia, unspecified: Secondary | ICD-10-CM | POA: Diagnosis not present

## 2016-01-18 DIAGNOSIS — M19041 Primary osteoarthritis, right hand: Secondary | ICD-10-CM | POA: Diagnosis not present

## 2016-03-11 DIAGNOSIS — Z23 Encounter for immunization: Secondary | ICD-10-CM | POA: Diagnosis not present

## 2016-05-25 DIAGNOSIS — I83893 Varicose veins of bilateral lower extremities with other complications: Secondary | ICD-10-CM | POA: Diagnosis not present

## 2016-06-06 DIAGNOSIS — I83893 Varicose veins of bilateral lower extremities with other complications: Secondary | ICD-10-CM | POA: Diagnosis not present

## 2016-06-13 DIAGNOSIS — I83893 Varicose veins of bilateral lower extremities with other complications: Secondary | ICD-10-CM | POA: Diagnosis not present

## 2016-06-13 DIAGNOSIS — I8312 Varicose veins of left lower extremity with inflammation: Secondary | ICD-10-CM | POA: Diagnosis not present

## 2016-06-13 DIAGNOSIS — I8311 Varicose veins of right lower extremity with inflammation: Secondary | ICD-10-CM | POA: Diagnosis not present

## 2016-07-25 DIAGNOSIS — I83813 Varicose veins of bilateral lower extremities with pain: Secondary | ICD-10-CM | POA: Diagnosis not present

## 2016-07-25 DIAGNOSIS — I8312 Varicose veins of left lower extremity with inflammation: Secondary | ICD-10-CM | POA: Diagnosis not present

## 2016-07-25 DIAGNOSIS — I83893 Varicose veins of bilateral lower extremities with other complications: Secondary | ICD-10-CM | POA: Diagnosis not present

## 2016-07-25 DIAGNOSIS — I8311 Varicose veins of right lower extremity with inflammation: Secondary | ICD-10-CM | POA: Diagnosis not present

## 2016-08-29 DIAGNOSIS — I83892 Varicose veins of left lower extremities with other complications: Secondary | ICD-10-CM | POA: Diagnosis not present

## 2016-08-29 DIAGNOSIS — I8312 Varicose veins of left lower extremity with inflammation: Secondary | ICD-10-CM | POA: Diagnosis not present

## 2016-08-31 DIAGNOSIS — I83893 Varicose veins of bilateral lower extremities with other complications: Secondary | ICD-10-CM | POA: Diagnosis not present

## 2016-08-31 DIAGNOSIS — I8311 Varicose veins of right lower extremity with inflammation: Secondary | ICD-10-CM | POA: Diagnosis not present

## 2016-08-31 DIAGNOSIS — I8312 Varicose veins of left lower extremity with inflammation: Secondary | ICD-10-CM | POA: Diagnosis not present

## 2016-10-03 DIAGNOSIS — I83892 Varicose veins of left lower extremities with other complications: Secondary | ICD-10-CM | POA: Diagnosis not present

## 2016-10-03 DIAGNOSIS — I8312 Varicose veins of left lower extremity with inflammation: Secondary | ICD-10-CM | POA: Diagnosis not present

## 2016-10-24 DIAGNOSIS — I8312 Varicose veins of left lower extremity with inflammation: Secondary | ICD-10-CM | POA: Diagnosis not present

## 2016-10-24 DIAGNOSIS — I83892 Varicose veins of left lower extremities with other complications: Secondary | ICD-10-CM | POA: Diagnosis not present

## 2016-11-14 DIAGNOSIS — I8312 Varicose veins of left lower extremity with inflammation: Secondary | ICD-10-CM | POA: Diagnosis not present

## 2016-11-21 DIAGNOSIS — L918 Other hypertrophic disorders of the skin: Secondary | ICD-10-CM | POA: Diagnosis not present

## 2016-11-21 DIAGNOSIS — L570 Actinic keratosis: Secondary | ICD-10-CM | POA: Diagnosis not present

## 2016-11-21 DIAGNOSIS — L237 Allergic contact dermatitis due to plants, except food: Secondary | ICD-10-CM | POA: Diagnosis not present

## 2016-11-21 DIAGNOSIS — D1801 Hemangioma of skin and subcutaneous tissue: Secondary | ICD-10-CM | POA: Diagnosis not present

## 2016-11-21 DIAGNOSIS — B351 Tinea unguium: Secondary | ICD-10-CM | POA: Diagnosis not present

## 2016-11-21 DIAGNOSIS — L821 Other seborrheic keratosis: Secondary | ICD-10-CM | POA: Diagnosis not present

## 2016-11-21 DIAGNOSIS — D224 Melanocytic nevi of scalp and neck: Secondary | ICD-10-CM | POA: Diagnosis not present

## 2016-11-28 DIAGNOSIS — I8312 Varicose veins of left lower extremity with inflammation: Secondary | ICD-10-CM | POA: Diagnosis not present

## 2016-11-28 DIAGNOSIS — H524 Presbyopia: Secondary | ICD-10-CM | POA: Diagnosis not present

## 2016-11-28 DIAGNOSIS — I83892 Varicose veins of left lower extremities with other complications: Secondary | ICD-10-CM | POA: Diagnosis not present

## 2016-11-28 DIAGNOSIS — H2513 Age-related nuclear cataract, bilateral: Secondary | ICD-10-CM | POA: Diagnosis not present

## 2016-11-28 DIAGNOSIS — H35372 Puckering of macula, left eye: Secondary | ICD-10-CM | POA: Diagnosis not present

## 2016-12-12 DIAGNOSIS — I83811 Varicose veins of right lower extremities with pain: Secondary | ICD-10-CM | POA: Diagnosis not present

## 2016-12-12 DIAGNOSIS — I8311 Varicose veins of right lower extremity with inflammation: Secondary | ICD-10-CM | POA: Diagnosis not present

## 2016-12-19 DIAGNOSIS — E785 Hyperlipidemia, unspecified: Secondary | ICD-10-CM | POA: Diagnosis not present

## 2016-12-19 DIAGNOSIS — R7309 Other abnormal glucose: Secondary | ICD-10-CM | POA: Diagnosis not present

## 2016-12-19 DIAGNOSIS — Z0000 Encounter for general adult medical examination without abnormal findings: Secondary | ICD-10-CM | POA: Diagnosis not present

## 2016-12-26 DIAGNOSIS — I8311 Varicose veins of right lower extremity with inflammation: Secondary | ICD-10-CM | POA: Diagnosis not present

## 2016-12-26 DIAGNOSIS — I83891 Varicose veins of right lower extremities with other complications: Secondary | ICD-10-CM | POA: Diagnosis not present

## 2017-01-23 DIAGNOSIS — I8311 Varicose veins of right lower extremity with inflammation: Secondary | ICD-10-CM | POA: Diagnosis not present

## 2017-01-30 DIAGNOSIS — B354 Tinea corporis: Secondary | ICD-10-CM | POA: Diagnosis not present

## 2017-01-30 DIAGNOSIS — L738 Other specified follicular disorders: Secondary | ICD-10-CM | POA: Diagnosis not present

## 2017-02-15 DIAGNOSIS — L738 Other specified follicular disorders: Secondary | ICD-10-CM | POA: Diagnosis not present

## 2017-04-13 ENCOUNTER — Emergency Department (HOSPITAL_COMMUNITY): Payer: PPO

## 2017-04-13 ENCOUNTER — Other Ambulatory Visit: Payer: Self-pay

## 2017-04-13 ENCOUNTER — Encounter (HOSPITAL_COMMUNITY): Payer: Self-pay | Admitting: Emergency Medicine

## 2017-04-13 ENCOUNTER — Emergency Department (HOSPITAL_COMMUNITY)
Admission: EM | Admit: 2017-04-13 | Discharge: 2017-04-13 | Disposition: A | Discharge status: Home / Self Care | Payer: PPO | Attending: Emergency Medicine | Admitting: Emergency Medicine

## 2017-04-13 DIAGNOSIS — R197 Diarrhea, unspecified: Secondary | ICD-10-CM | POA: Diagnosis not present

## 2017-04-13 DIAGNOSIS — N200 Calculus of kidney: Secondary | ICD-10-CM | POA: Diagnosis not present

## 2017-04-13 DIAGNOSIS — Z87891 Personal history of nicotine dependence: Secondary | ICD-10-CM | POA: Insufficient documentation

## 2017-04-13 DIAGNOSIS — R1032 Left lower quadrant pain: Secondary | ICD-10-CM | POA: Diagnosis present

## 2017-04-13 DIAGNOSIS — Z79899 Other long term (current) drug therapy: Secondary | ICD-10-CM | POA: Insufficient documentation

## 2017-04-13 DIAGNOSIS — Z7982 Long term (current) use of aspirin: Secondary | ICD-10-CM | POA: Insufficient documentation

## 2017-04-13 DIAGNOSIS — N132 Hydronephrosis with renal and ureteral calculous obstruction: Secondary | ICD-10-CM | POA: Diagnosis not present

## 2017-04-13 LAB — CBC
HEMATOCRIT: 43.8 % (ref 39.0–52.0)
Hemoglobin: 15.1 g/dL (ref 13.0–17.0)
MCH: 30.4 pg (ref 26.0–34.0)
MCHC: 34.5 g/dL (ref 30.0–36.0)
MCV: 88.3 fL (ref 78.0–100.0)
Platelets: 230 10*3/uL (ref 150–400)
RBC: 4.96 MIL/uL (ref 4.22–5.81)
RDW: 13.4 % (ref 11.5–15.5)
WBC: 9.4 10*3/uL (ref 4.0–10.5)

## 2017-04-13 LAB — URINALYSIS, ROUTINE W REFLEX MICROSCOPIC
BILIRUBIN URINE: NEGATIVE
Glucose, UA: NEGATIVE mg/dL
KETONES UR: 20 mg/dL — AB
LEUKOCYTES UA: NEGATIVE
NITRITE: NEGATIVE
PROTEIN: NEGATIVE mg/dL
Specific Gravity, Urine: 1.015 (ref 1.005–1.030)
pH: 5 (ref 5.0–8.0)

## 2017-04-13 LAB — COMPREHENSIVE METABOLIC PANEL
ALBUMIN: 4.6 g/dL (ref 3.5–5.0)
ALT: 22 U/L (ref 17–63)
ANION GAP: 8 (ref 5–15)
AST: 24 U/L (ref 15–41)
Alkaline Phosphatase: 86 U/L (ref 38–126)
BUN: 22 mg/dL — ABNORMAL HIGH (ref 6–20)
CALCIUM: 9.1 mg/dL (ref 8.9–10.3)
CO2: 25 mmol/L (ref 22–32)
Chloride: 106 mmol/L (ref 101–111)
Creatinine, Ser: 1.04 mg/dL (ref 0.61–1.24)
GFR calc Af Amer: >60 mL/min (ref >60)
GFR calc non Af Amer: >60 mL/min (ref >60)
GLUCOSE: 109 mg/dL — AB (ref 65–99)
Potassium: 4.1 mmol/L (ref 3.5–5.1)
SODIUM: 139 mmol/L (ref 135–145)
Total Bilirubin: 1.9 mg/dL — ABNORMAL HIGH (ref 0.3–1.2)
Total Protein: 7.8 g/dL (ref 6.5–8.1)

## 2017-04-13 LAB — LIPASE, BLOOD: Lipase: 20 U/L (ref 11–51)

## 2017-04-13 MED ORDER — IOPAMIDOL (ISOVUE-300) INJECTION 61%
INTRAVENOUS | Status: AC
  Filled 2017-04-13: qty 100

## 2017-04-13 MED ORDER — IOPAMIDOL (ISOVUE-300) INJECTION 61%
100.0000 mL | Freq: Once | INTRAVENOUS | Status: AC | PRN
  Administered 2017-04-13: 100 mL via INTRAVENOUS

## 2017-04-13 MED ORDER — TRAMADOL HCL 50 MG PO TABS: 50.0000 mg | ORAL_TABLET | Freq: Four times a day (QID) | ORAL | 0 refills | Status: DC | PRN

## 2017-04-13 MED ORDER — IOPAMIDOL (ISOVUE-300) INJECTION 61%: 100.0000 mL | Freq: Once | INTRAVENOUS | Status: DC | PRN

## 2017-04-13 MED ORDER — TRAMADOL HCL 50 MG PO TABS
50.0000 mg | ORAL_TABLET | Freq: Once | ORAL | Status: AC
Start: 2017-04-13 — End: 2017-04-13
  Administered 2017-04-13: 50 mg via ORAL
  Filled 2017-04-13: qty 1

## 2017-04-13 NOTE — ED Provider Notes (Signed)
[facility] DEPT Provider Note   CSN: [id number] Arrival date & time: 04/13/17  0855     History   Chief Complaint Chief Complaint  Patient presents with  . Abdominal Pain    HPI KEEGHAN BIALY is a 71 y.o. male.  The history is provided by the patient and medical records. No language interpreter was used.  Abdominal Pain   Associated symptoms include diarrhea and constipation. Pertinent negatives include dysuria and frequency.   CHRISOPHER PUSTEJOVSKY is a 71 y.o. male  with a PMH of arthritis who presents to the Emergency Department complaining of left lower quadrant abdominal pain x 3 days. Pain is constant, however sometimes will be sharp and severe, while other times pain is a dull ache. He typically has a bowel movement daily, he went two days without BM, then yesterday had an episode of diarrhea. Denies noticing any blood in the stool. No nausea, chest pain, back pain, shortness of breath, dysuria, urgency/frequency, fever or chills. No alleviating or aggravating factors noted.   Past Medical History:  Diagnosis Date  . Arthritis    R knee- DJD  . Complication of anesthesia    tachycardia with ST depression, HTN witth left TKA 04/2010 (neg cardiac w/u)  . Difficult intubation    glidescope used 04/06/10    There are no active problems to display for this patient.   Past Surgical History:  Procedure Laterality Date  . JOINT REPLACEMENT     L TKA       Home Medications    Prior to Admission medications   Medication Sig Start Date End Date Taking? Authorizing Provider  aspirin 81 MG chewable tablet Chew 81 mg daily by mouth.   Yes [provider]  docusate sodium (COLACE) 100 MG capsule Take 100 mg daily by mouth.   Yes [provider]  pravastatin (PRAVACHOL) 10 MG tablet Take 10 mg daily by mouth. 04/01/17  Yes [provider]  aspirin 325 MG tablet Take 1 tablet (325 mg total) by mouth daily. Patient not  taking: Reported on 11/03/2014 03/21/13   Bayard Males, PA-C  methocarbamol (ROBAXIN) 500 MG tablet Take 1 tablet (500 mg total) by mouth 4 (four) times daily. Patient not taking: Reported on 11/03/2014 03/21/13   Bayard Males, PA-C  senna (SENOKOT) 8.6 MG tablet Take 1 tablet (8.6 mg total) by mouth daily. Patient not taking: Reported on 11/03/2014 03/21/13   Bayard Males, PA-C  traMADol (ULTRAM) 50 MG tablet Take 1 tablet (50 mg total) every 6 (six) hours as needed by mouth. 04/13/17   Ward, Ozella Almond, PA-C    Family History No family history on file.  Social History Social History   Tobacco Use  . Smoking status: Former Smoker    Last attempt to quit: 03/10/1998    Years since quitting: 19.1  Substance Use Topics  . Alcohol use: Yes    Comment: 3 beers per week  . Drug use: No     Allergies   Patient has no known allergies.   Review of Systems Review of Systems  Gastrointestinal: Positive for abdominal pain, constipation and diarrhea. Negative for blood in stool.  Genitourinary: Negative for difficulty urinating, dysuria, frequency and urgency.  All other systems reviewed and are negative.    Physical Exam Updated Vital Signs BP 136/82   Pulse 71   Temp 98.4 F (36.9 C) (Oral)   Resp 15   SpO2 98%   Physical Exam  Constitutional:  He is oriented to person, place, and time. He appears well-developed and well-nourished. No distress.  HENT:  Head: Normocephalic and atraumatic.  Cardiovascular: Normal rate, regular rhythm and normal heart sounds.  No murmur heard. Pulmonary/Chest: Effort normal and breath sounds normal. No respiratory distress.  Abdominal: Soft. He exhibits no distension. There is tenderness (LLQ).  Musculoskeletal: He exhibits no edema.  Neurological: He is alert and oriented to person, place, and time.  Skin: Skin is warm and dry.  Nursing note and vitals reviewed.    ED Treatments / Results  Labs (all labs ordered are listed, but  only abnormal results are displayed) Labs Reviewed  COMPREHENSIVE METABOLIC PANEL - Abnormal; Notable for the following components:      Result Value   Glucose, Bld 109 (*)    BUN 22 (*)    Total Bilirubin 1.9 (*)    All other components within normal limits  URINALYSIS, ROUTINE W REFLEX MICROSCOPIC - Abnormal; Notable for the following components:   Hgb urine dipstick LARGE (*)    Ketones, ur 20 (*)    Bacteria, UA RARE (*)    Squamous Epithelial / LPF 0-5 (*)    All other components within normal limits  LIPASE, BLOOD  CBC    EKG  EKG Interpretation None       Radiology Ct Abdomen Pelvis W Contrast  Result Date: 04/13/2017 CLINICAL DATA:  Left lower quadrant pain with diarrhea and nausea. EXAM: CT ABDOMEN AND PELVIS WITH CONTRAST TECHNIQUE: Multidetector CT imaging of the abdomen and pelvis was performed using the standard protocol following bolus administration of intravenous contrast. CONTRAST:  132mL ISOVUE-300 IOPAMIDOL (ISOVUE-300) INJECTION 61% COMPARISON:  None FINDINGS: Lower chest: Normal. Hepatobiliary: No focal liver abnormality is seen. No gallstones, gallbladder wall thickening, or biliary dilatation. Pancreas: Unremarkable. No pancreatic ductal dilatation or surrounding inflammatory changes. Spleen: Normal in size without focal abnormality. Adrenals/Urinary Tract: There is mild left hydronephrosis. The left ureter is dilated to a 4 mm stone in the left ureterovesical junction with edema in the mucosa at that site. 2 mm stone in the upper pole of the left kidney. 2 mm stone in the lower pole of the right kidney. 4 mm cyst in the lower pole of the right kidney. Prominence of the median lobe of the prostate gland. Stomach/Bowel: Stomach is within normal limits. Appendix is not visualized. No evidence of bowel wall thickening, distention, or inflammatory changes. Vascular/Lymphatic: Aortic atherosclerosis. No enlarged abdominal or pelvic lymph nodes. Reproductive: Enlarging of  the median lobe of the prostate gland. Overall prostate size is normal. Other: No abdominal wall hernia or abnormality. No abdominopelvic ascites. Musculoskeletal: No acute abnormalities. Diffuse degenerative disc and joint disease in the lumbar spine. IMPRESSION: 4 mm stone obstructing the distal left ureter at the left ureterovesical junction. Edema in the mucosa at the UVJ. 2 mm stones in the upper pole of the left kidney and in the lower pole of the right kidney. Aortic atherosclerosis. Electronically Signed   By: Lorriane Shire M.D.   On: 04/13/2017 13:37    Procedures Procedures (including critical care time)  Medications Ordered in ED Medications  iopamidol (ISOVUE-300) 61 % injection 100 mL (not administered)  iopamidol (ISOVUE-300) 61 % injection (not administered)  iopamidol (ISOVUE-300) 61 % injection 100 mL (100 mLs Intravenous Contrast Given 04/13/17 1323)  traMADol (ULTRAM) tablet 50 mg (50 mg Oral Given 04/13/17 1440)     Initial Impression / Assessment and Plan / ED Course  I have reviewed  the triage vital signs and the nursing notes.  Pertinent labs & imaging results that were available during my care of the patient were reviewed by me and considered in my medical decision making (see chart for details).    ROSHAN ROBACK is a 71 y.o. male who presents to ED for LLQ abdominal pain x 2-3 days. On exam, patient is afebrile, hemodynamically stable with tenderness to palpation of LLQ. Labs reviewed and reassuring. UA does not appear infectious, but does note large amount of hgb. CT with 4 mm stone in left UVJ. Pain controlled. Tolerating PO. Will give urology follow up. Return precautions discussed with patient at length. All questions answered.   Patient seen by and discussed with Dr. Ellender Hose who agrees with treatment plan.    Final Clinical Impressions(s) / ED Diagnoses   Final diagnoses:  Nephrolithiasis    ED Discharge Orders        Ordered    traMADol (ULTRAM) 50 MG  tablet  Every 6 hours PRN     04/13/17 1404       Ward, Ozella Almond, PA-C 04/13/17 1529    Duffy Bruce, MD 04/14/17 1348

## 2017-04-13 NOTE — ED Notes (Signed)
Bed: WA07 Expected date:  Expected time:  Means of arrival:  Comments: 

## 2017-04-13 NOTE — ED Triage Notes (Signed)
Pt complaint of LLQ pain on and off since Tuesday associated diarrhea, constipation, and nausea. Pt denies GU symptoms.

## 2017-04-13 NOTE — ED Notes (Signed)
He returns from CT at this time. He is in no distress.

## 2017-04-13 NOTE — Discharge Instructions (Signed)
It was my pleasure taking care of you today!   You have been diagnosed with kidney stones. Drink plenty of fluids to help you pass the stone.  Take Tylenol as needed for mild to moderate pain. Use your pain medication as directed and only as needed for severe pain. Follow up with your doctor in regards to your hospital visit.   Return to the ED immediately if you develop fever that persists > 101, uncontrolled pain or vomiting, or other concerns.

## 2017-04-13 NOTE — ED Notes (Signed)
PT info this writer he have been unable to make BM in two days.

## 2017-04-13 NOTE — ED Notes (Signed)
Sample for urine culture sent to lab

## 2017-04-23 DIAGNOSIS — I8311 Varicose veins of right lower extremity with inflammation: Secondary | ICD-10-CM | POA: Diagnosis not present

## 2017-04-23 DIAGNOSIS — I83891 Varicose veins of right lower extremities with other complications: Secondary | ICD-10-CM | POA: Diagnosis not present

## 2017-05-07 DIAGNOSIS — I8311 Varicose veins of right lower extremity with inflammation: Secondary | ICD-10-CM | POA: Diagnosis not present

## 2017-05-07 DIAGNOSIS — I83891 Varicose veins of right lower extremities with other complications: Secondary | ICD-10-CM | POA: Diagnosis not present

## 2017-07-31 DIAGNOSIS — B351 Tinea unguium: Secondary | ICD-10-CM | POA: Diagnosis not present

## 2017-10-02 DIAGNOSIS — I8311 Varicose veins of right lower extremity with inflammation: Secondary | ICD-10-CM | POA: Diagnosis not present

## 2017-10-02 DIAGNOSIS — I8312 Varicose veins of left lower extremity with inflammation: Secondary | ICD-10-CM | POA: Diagnosis not present

## 2017-10-09 DIAGNOSIS — M47812 Spondylosis without myelopathy or radiculopathy, cervical region: Secondary | ICD-10-CM | POA: Diagnosis not present

## 2017-10-09 DIAGNOSIS — M542 Cervicalgia: Secondary | ICD-10-CM | POA: Diagnosis not present

## 2017-10-10 DIAGNOSIS — B351 Tinea unguium: Secondary | ICD-10-CM | POA: Diagnosis not present

## 2017-10-10 DIAGNOSIS — L821 Other seborrheic keratosis: Secondary | ICD-10-CM | POA: Diagnosis not present

## 2017-10-10 DIAGNOSIS — B360 Pityriasis versicolor: Secondary | ICD-10-CM | POA: Diagnosis not present

## 2017-10-10 DIAGNOSIS — L570 Actinic keratosis: Secondary | ICD-10-CM | POA: Diagnosis not present

## 2017-10-10 DIAGNOSIS — L918 Other hypertrophic disorders of the skin: Secondary | ICD-10-CM | POA: Diagnosis not present

## 2017-11-22 ENCOUNTER — Encounter: Payer: Self-pay | Admitting: Podiatry

## 2017-11-22 ENCOUNTER — Ambulatory Visit: Payer: PPO | Admitting: Podiatry

## 2017-11-22 VITALS — BP 154/87 | HR 72 | Resp 16

## 2017-11-22 DIAGNOSIS — L603 Nail dystrophy: Secondary | ICD-10-CM | POA: Diagnosis not present

## 2017-11-22 MED ORDER — NEOMYCIN-POLYMYXIN-HC 1 % OT SOLN: OTIC | 1 refills | Status: DC

## 2017-11-22 NOTE — Progress Notes (Signed)
  Subjective:  Patient ID: Isaac [name], male    DOB: 21-Apr-1946,  MRN: [id number] HPI Chief Complaint  Patient presents with  . Nail Problem    Hallux nail right - loose nail x 1 week ago, cut the loosened piece off but now the other side is loose and nail is split and tender medial side, kept covered with a bandaid  . New Patient (Initial Visit)    72 y.o. male presents with the above complaint.   ROS: Denies fever chills nausea vomiting muscle aches pains calf pain back pain chest pain shortness of breath.  Past Medical History:  Diagnosis Date  . Arthritis    R knee- DJD  . Complication of anesthesia    tachycardia with ST depression, HTN witth left TKA 04/2010 (neg cardiac w/u)  . Difficult intubation    glidescope used 04/06/10   Past Surgical History:  Procedure Laterality Date  . JOINT REPLACEMENT     L TKA  . TOTAL KNEE ARTHROPLASTY Right 03/19/2013   Procedure: TOTAL KNEE ARTHROPLASTY;  Surgeon: Ninetta Lights, MD;  Location: [facility];  Service: Orthopedics;  Laterality: Right;    Current Outpatient Medications:  .  NEOMYCIN-POLYMYXIN-HYDROCORTISONE (CORTISPORIN) 1 % SOLN OTIC solution, Apply 1-2 drops to toe BID after soaking, Disp: 10 mL, Rfl: 1  No Known Allergies Review of Systems Objective:   Vitals:   11/22/17 1007  BP: (!) 154/87  Pulse: 72  Resp: 16    General: Well developed, nourished, in no acute distress, alert and oriented x3   Dermatological: Skin is warm, dry and supple bilateral. Nails x 10 are well maintained; remaining integument appears unremarkable at this time. There are no open sores, no preulcerative lesions, no rash or signs of infection present.  Vascular: Dorsalis Pedis artery and Posterior Tibial artery pedal pulses are 2/4 bilateral with immedate capillary fill time. Pedal hair growth present. No varicosities and no lower extremity edema present bilateral.   Neruologic: Grossly intact via light touch bilateral. Vibratory intact  via tuning fork bilateral. Protective threshold with Semmes Wienstein monofilament intact to all pedal sites bilateral. Patellar and Achilles deep tendon reflexes 2+ bilateral. No Babinski or clonus noted bilateral.   Musculoskeletal: No gross boney pedal deformities bilateral. No pain, crepitus, or limitation noted with foot and ankle range of motion bilateral. Muscular strength 5/5 in all groups tested bilateral.  Gait: Unassisted, Nonantalgic.    Radiographs:  No radiographs taken today.  Assessment & Plan:   Assessment: Paronychia hallux right.  Plan: Incision and drainage today was performed with removal of a small nail spicule after 3 cc of a 50-50 mixture of Marcaine plain and lidocaine plain was infiltrated in a hallux block right.  Tolerated procedure well after sterile Betadine skin prep.  Was given both oral and written with instructions for care soaking of his toe as well as a prescription for Cortisporin Otic to be applied twice daily after soaking.     Max T. [place], [place]

## 2017-11-22 NOTE — Patient Instructions (Signed)

## 2017-12-03 DIAGNOSIS — H2513 Age-related nuclear cataract, bilateral: Secondary | ICD-10-CM | POA: Diagnosis not present

## 2017-12-03 DIAGNOSIS — H43311 Vitreous membranes and strands, right eye: Secondary | ICD-10-CM | POA: Diagnosis not present

## 2017-12-03 DIAGNOSIS — H35372 Puckering of macula, left eye: Secondary | ICD-10-CM | POA: Diagnosis not present

## 2017-12-03 DIAGNOSIS — H538 Other visual disturbances: Secondary | ICD-10-CM | POA: Diagnosis not present

## 2017-12-11 ENCOUNTER — Ambulatory Visit (INDEPENDENT_AMBULATORY_CARE_PROVIDER_SITE_OTHER): Payer: Self-pay

## 2017-12-11 DIAGNOSIS — L603 Nail dystrophy: Secondary | ICD-10-CM

## 2017-12-14 NOTE — Progress Notes (Signed)
Patient presents s/p right hallux nail border avulsion performed on 6.20.19. He states that he is doing well, no pain, no issues with the area  Noted well healing borders, no erythema, no drainage, no s/s of infection.   Advised patient to report any s/s of infection immediately. Follow up prn

## 2018-01-01 DIAGNOSIS — E785 Hyperlipidemia, unspecified: Secondary | ICD-10-CM | POA: Diagnosis not present

## 2018-01-01 DIAGNOSIS — R7309 Other abnormal glucose: Secondary | ICD-10-CM | POA: Diagnosis not present

## 2018-01-01 DIAGNOSIS — Z0000 Encounter for general adult medical examination without abnormal findings: Secondary | ICD-10-CM | POA: Diagnosis not present

## 2018-02-11 DIAGNOSIS — S0121XA Laceration without foreign body of nose, initial encounter: Secondary | ICD-10-CM | POA: Diagnosis not present

## 2018-05-23 ENCOUNTER — Ambulatory Visit: Payer: PPO | Admitting: Podiatry

## 2018-05-23 ENCOUNTER — Ambulatory Visit (INDEPENDENT_AMBULATORY_CARE_PROVIDER_SITE_OTHER): Payer: PPO

## 2018-05-23 ENCOUNTER — Encounter: Payer: Self-pay | Admitting: Podiatry

## 2018-05-23 DIAGNOSIS — M7751 Other enthesopathy of right foot: Secondary | ICD-10-CM

## 2018-05-23 DIAGNOSIS — M7752 Other enthesopathy of left foot: Secondary | ICD-10-CM

## 2018-05-23 DIAGNOSIS — M7750 Other enthesopathy of unspecified foot: Secondary | ICD-10-CM

## 2018-05-23 MED ORDER — OXYCODONE-ACETAMINOPHEN 10-325 MG PO TABS: 1.0000 | ORAL_TABLET | Freq: Three times a day (TID) | ORAL | 0 refills | Status: DC | PRN

## 2018-05-25 NOTE — Progress Notes (Signed)
He presents today with a chief complaint of pain to the lateral ankle right in the anterior lateral ankle left states that they really been hurting for past few months they feel weak may be a just have some arthritis and effort for rashes get it checked out.  He denies any trauma he denies fever chills nausea vomiting muscle aches pains other than the ankle.  Objective: Vital signs are stable he is alert and oriented x3.  Pulses are palpable.  No appreciable reproducible pain on the left side.  However the right side does demonstrate pain in the sinus tarsi and on end range of the subtalar joint motion.  Radiographs do demonstrate some arthritic changes of the midfoot subtalar joint and ankle.  Assessment: Subtalar joint capsulitis right.  Plan: After sterile Betadine skin prep I injected 20 mg Kenalog 5 mg Marcaine point maximal tenderness sinus tarsi right foot.  I will follow-up with him in the near future for reevaluation.

## 2018-08-27 DIAGNOSIS — M25551 Pain in right hip: Secondary | ICD-10-CM | POA: Diagnosis not present

## 2018-08-27 DIAGNOSIS — M5137 Other intervertebral disc degeneration, lumbosacral region: Secondary | ICD-10-CM | POA: Diagnosis not present

## 2018-08-27 DIAGNOSIS — M545 Low back pain: Secondary | ICD-10-CM | POA: Diagnosis not present

## 2018-08-27 DIAGNOSIS — M48061 Spinal stenosis, lumbar region without neurogenic claudication: Secondary | ICD-10-CM | POA: Diagnosis not present

## 2018-11-14 DIAGNOSIS — L918 Other hypertrophic disorders of the skin: Secondary | ICD-10-CM | POA: Diagnosis not present

## 2018-11-14 DIAGNOSIS — L814 Other melanin hyperpigmentation: Secondary | ICD-10-CM | POA: Diagnosis not present

## 2018-11-14 DIAGNOSIS — L821 Other seborrheic keratosis: Secondary | ICD-10-CM | POA: Diagnosis not present

## 2018-11-14 DIAGNOSIS — L570 Actinic keratosis: Secondary | ICD-10-CM | POA: Diagnosis not present

## 2018-11-14 DIAGNOSIS — B351 Tinea unguium: Secondary | ICD-10-CM | POA: Diagnosis not present

## 2018-11-14 DIAGNOSIS — D1801 Hemangioma of skin and subcutaneous tissue: Secondary | ICD-10-CM | POA: Diagnosis not present

## 2019-01-10 DIAGNOSIS — Z0000 Encounter for general adult medical examination without abnormal findings: Secondary | ICD-10-CM | POA: Diagnosis not present

## 2019-01-10 DIAGNOSIS — R7309 Other abnormal glucose: Secondary | ICD-10-CM | POA: Diagnosis not present

## 2019-01-10 DIAGNOSIS — E785 Hyperlipidemia, unspecified: Secondary | ICD-10-CM | POA: Diagnosis not present

## 2019-01-14 DIAGNOSIS — Z0000 Encounter for general adult medical examination without abnormal findings: Secondary | ICD-10-CM | POA: Diagnosis not present

## 2019-01-14 DIAGNOSIS — E785 Hyperlipidemia, unspecified: Secondary | ICD-10-CM | POA: Diagnosis not present

## 2019-01-14 DIAGNOSIS — R7309 Other abnormal glucose: Secondary | ICD-10-CM | POA: Diagnosis not present

## 2019-04-16 DIAGNOSIS — L821 Other seborrheic keratosis: Secondary | ICD-10-CM | POA: Diagnosis not present

## 2019-04-16 DIAGNOSIS — L239 Allergic contact dermatitis, unspecified cause: Secondary | ICD-10-CM | POA: Diagnosis not present

## 2019-04-25 DIAGNOSIS — Z20828 Contact with and (suspected) exposure to other viral communicable diseases: Secondary | ICD-10-CM | POA: Diagnosis not present

## 2019-05-13 DIAGNOSIS — Z20828 Contact with and (suspected) exposure to other viral communicable diseases: Secondary | ICD-10-CM | POA: Diagnosis not present

## 2019-06-09 DIAGNOSIS — L308 Other specified dermatitis: Secondary | ICD-10-CM | POA: Diagnosis not present

## 2019-06-09 DIAGNOSIS — L821 Other seborrheic keratosis: Secondary | ICD-10-CM | POA: Diagnosis not present

## 2019-06-09 DIAGNOSIS — L814 Other melanin hyperpigmentation: Secondary | ICD-10-CM | POA: Diagnosis not present

## 2019-06-09 DIAGNOSIS — I8393 Asymptomatic varicose veins of bilateral lower extremities: Secondary | ICD-10-CM | POA: Diagnosis not present

## 2019-06-26 DIAGNOSIS — L308 Other specified dermatitis: Secondary | ICD-10-CM | POA: Diagnosis not present

## 2019-06-26 DIAGNOSIS — R21 Rash and other nonspecific skin eruption: Secondary | ICD-10-CM | POA: Diagnosis not present

## 2019-06-26 DIAGNOSIS — L718 Other rosacea: Secondary | ICD-10-CM | POA: Diagnosis not present

## 2019-07-03 ENCOUNTER — Ambulatory Visit: Payer: PPO

## 2019-07-04 ENCOUNTER — Ambulatory Visit: Payer: PPO

## 2019-07-10 DIAGNOSIS — L821 Other seborrheic keratosis: Secondary | ICD-10-CM | POA: Diagnosis not present

## 2019-07-10 DIAGNOSIS — R21 Rash and other nonspecific skin eruption: Secondary | ICD-10-CM | POA: Diagnosis not present

## 2019-07-10 DIAGNOSIS — L308 Other specified dermatitis: Secondary | ICD-10-CM | POA: Diagnosis not present

## 2019-07-10 DIAGNOSIS — Z79899 Other long term (current) drug therapy: Secondary | ICD-10-CM | POA: Diagnosis not present

## 2019-07-11 ENCOUNTER — Ambulatory Visit: Payer: PPO | Attending: Internal Medicine

## 2019-07-11 DIAGNOSIS — Z23 Encounter for immunization: Secondary | ICD-10-CM | POA: Insufficient documentation

## 2019-07-11 NOTE — Progress Notes (Signed)
   Covid-19 Vaccination Clinic  Name:  Isaac [name]    MRN: [id number] DOB: [birth year]  07/11/2019  Isaac [name] was observed post Covid-19 immunization for 15 minutes without incidence. He was provided with Vaccine Information Sheet and instruction to access the V-Safe system.   Isaac [name] was instructed to call 911 with any severe reactions post vaccine: Marland Kitchen Difficulty breathing  . Swelling of your face and throat  . A fast heartbeat  . A bad rash all over your body  . Dizziness and weakness    Immunizations Administered    Name Date Dose VIS Date Route   Pfizer COVID-19 Vaccine 07/11/2019 10:12 AM 0.3 mL 05/16/2019 Intramuscular   Manufacturer: [place]   Lot: [id number]   [place]: [id number]

## 2019-07-23 DIAGNOSIS — L308 Other specified dermatitis: Secondary | ICD-10-CM | POA: Diagnosis not present

## 2019-07-23 DIAGNOSIS — R21 Rash and other nonspecific skin eruption: Secondary | ICD-10-CM | POA: Diagnosis not present

## 2019-08-05 ENCOUNTER — Ambulatory Visit: Payer: PPO | Attending: Internal Medicine

## 2019-08-05 DIAGNOSIS — Z23 Encounter for immunization: Secondary | ICD-10-CM | POA: Insufficient documentation

## 2019-08-05 NOTE — Progress Notes (Signed)
   Covid-19 Vaccination Clinic  Name:  Isaac [name]    MRN: [id number] DOB: [birth year]  08/05/2019  Mr. [name] was observed post Covid-19 immunization for 15 minutes without incident. He was provided with Vaccine Information Sheet and instruction to access the V-Safe system.   Mr. [name] was instructed to call 911 with any severe reactions post vaccine: Marland Kitchen Difficulty breathing  . Swelling of face and throat  . A fast heartbeat  . A bad rash all over body  . Dizziness and weakness   Immunizations Administered    Name Date Dose VIS Date Route   Pfizer COVID-19 Vaccine 08/05/2019 11:03 AM 0.3 mL 05/16/2019 Intramuscular   Manufacturer: [place]   Lot: [id number]   [place]: [id number]

## 2019-08-08 DIAGNOSIS — L308 Other specified dermatitis: Secondary | ICD-10-CM | POA: Diagnosis not present

## 2019-08-08 DIAGNOSIS — L821 Other seborrheic keratosis: Secondary | ICD-10-CM | POA: Diagnosis not present

## 2019-09-19 DIAGNOSIS — L718 Other rosacea: Secondary | ICD-10-CM | POA: Diagnosis not present

## 2019-09-19 DIAGNOSIS — L2089 Other atopic dermatitis: Secondary | ICD-10-CM | POA: Diagnosis not present

## 2019-09-22 DIAGNOSIS — L2089 Other atopic dermatitis: Secondary | ICD-10-CM | POA: Diagnosis not present

## 2019-09-26 DIAGNOSIS — L239 Allergic contact dermatitis, unspecified cause: Secondary | ICD-10-CM | POA: Diagnosis not present

## 2019-09-26 DIAGNOSIS — L570 Actinic keratosis: Secondary | ICD-10-CM | POA: Diagnosis not present

## 2019-11-20 DIAGNOSIS — J3089 Other allergic rhinitis: Secondary | ICD-10-CM | POA: Diagnosis not present

## 2019-11-20 DIAGNOSIS — J301 Allergic rhinitis due to pollen: Secondary | ICD-10-CM | POA: Diagnosis not present

## 2019-11-20 DIAGNOSIS — L2089 Other atopic dermatitis: Secondary | ICD-10-CM | POA: Diagnosis not present

## 2019-12-03 DIAGNOSIS — M25512 Pain in left shoulder: Secondary | ICD-10-CM | POA: Diagnosis not present

## 2020-01-20 DIAGNOSIS — E785 Hyperlipidemia, unspecified: Secondary | ICD-10-CM | POA: Diagnosis not present

## 2020-01-20 DIAGNOSIS — R011 Cardiac murmur, unspecified: Secondary | ICD-10-CM | POA: Diagnosis not present

## 2020-01-20 DIAGNOSIS — Z0000 Encounter for general adult medical examination without abnormal findings: Secondary | ICD-10-CM | POA: Diagnosis not present

## 2020-01-20 DIAGNOSIS — R7309 Other abnormal glucose: Secondary | ICD-10-CM | POA: Diagnosis not present

## 2020-02-06 DIAGNOSIS — L821 Other seborrheic keratosis: Secondary | ICD-10-CM | POA: Diagnosis not present

## 2020-02-06 DIAGNOSIS — D1801 Hemangioma of skin and subcutaneous tissue: Secondary | ICD-10-CM | POA: Diagnosis not present

## 2020-02-06 DIAGNOSIS — L308 Other specified dermatitis: Secondary | ICD-10-CM | POA: Diagnosis not present

## 2020-02-06 DIAGNOSIS — L570 Actinic keratosis: Secondary | ICD-10-CM | POA: Diagnosis not present

## 2020-02-06 DIAGNOSIS — L918 Other hypertrophic disorders of the skin: Secondary | ICD-10-CM | POA: Diagnosis not present

## 2020-02-06 DIAGNOSIS — L820 Inflamed seborrheic keratosis: Secondary | ICD-10-CM | POA: Diagnosis not present

## 2020-07-12 DIAGNOSIS — J3089 Other allergic rhinitis: Secondary | ICD-10-CM | POA: Diagnosis not present

## 2020-07-12 DIAGNOSIS — J301 Allergic rhinitis due to pollen: Secondary | ICD-10-CM | POA: Diagnosis not present

## 2020-07-12 DIAGNOSIS — R21 Rash and other nonspecific skin eruption: Secondary | ICD-10-CM | POA: Diagnosis not present

## 2020-07-12 DIAGNOSIS — L209 Atopic dermatitis, unspecified: Secondary | ICD-10-CM | POA: Diagnosis not present

## 2020-12-13 DIAGNOSIS — M79644 Pain in right finger(s): Secondary | ICD-10-CM | POA: Diagnosis not present

## 2020-12-20 DIAGNOSIS — L570 Actinic keratosis: Secondary | ICD-10-CM | POA: Diagnosis not present

## 2020-12-20 DIAGNOSIS — D485 Neoplasm of uncertain behavior of skin: Secondary | ICD-10-CM | POA: Diagnosis not present

## 2020-12-20 DIAGNOSIS — L820 Inflamed seborrheic keratosis: Secondary | ICD-10-CM | POA: Diagnosis not present

## 2020-12-20 DIAGNOSIS — L821 Other seborrheic keratosis: Secondary | ICD-10-CM | POA: Diagnosis not present

## 2020-12-20 DIAGNOSIS — D0439 Carcinoma in situ of skin of other parts of face: Secondary | ICD-10-CM | POA: Diagnosis not present

## 2020-12-30 DIAGNOSIS — M19041 Primary osteoarthritis, right hand: Secondary | ICD-10-CM | POA: Diagnosis not present

## 2020-12-30 DIAGNOSIS — M19049 Primary osteoarthritis, unspecified hand: Secondary | ICD-10-CM | POA: Diagnosis not present

## 2020-12-30 DIAGNOSIS — M65342 Trigger finger, left ring finger: Secondary | ICD-10-CM | POA: Diagnosis not present

## 2020-12-30 DIAGNOSIS — M1812 Unilateral primary osteoarthritis of first carpometacarpal joint, left hand: Secondary | ICD-10-CM | POA: Diagnosis not present

## 2020-12-30 DIAGNOSIS — M79641 Pain in right hand: Secondary | ICD-10-CM | POA: Diagnosis not present

## 2021-01-21 DIAGNOSIS — E785 Hyperlipidemia, unspecified: Secondary | ICD-10-CM | POA: Diagnosis not present

## 2021-01-21 DIAGNOSIS — M79646 Pain in unspecified finger(s): Secondary | ICD-10-CM | POA: Diagnosis not present

## 2021-01-26 DIAGNOSIS — G8918 Other acute postprocedural pain: Secondary | ICD-10-CM | POA: Diagnosis not present

## 2021-01-26 DIAGNOSIS — M19141 Post-traumatic osteoarthritis, right hand: Secondary | ICD-10-CM | POA: Diagnosis not present

## 2021-01-31 DIAGNOSIS — Z4789 Encounter for other orthopedic aftercare: Secondary | ICD-10-CM | POA: Diagnosis not present

## 2021-01-31 DIAGNOSIS — M19049 Primary osteoarthritis, unspecified hand: Secondary | ICD-10-CM | POA: Diagnosis not present

## 2021-01-31 DIAGNOSIS — Z981 Arthrodesis status: Secondary | ICD-10-CM | POA: Diagnosis not present

## 2021-02-08 DIAGNOSIS — M65342 Trigger finger, left ring finger: Secondary | ICD-10-CM | POA: Diagnosis not present

## 2021-02-10 DIAGNOSIS — Z0000 Encounter for general adult medical examination without abnormal findings: Secondary | ICD-10-CM | POA: Diagnosis not present

## 2021-02-10 DIAGNOSIS — E785 Hyperlipidemia, unspecified: Secondary | ICD-10-CM | POA: Diagnosis not present

## 2021-02-10 DIAGNOSIS — R7309 Other abnormal glucose: Secondary | ICD-10-CM | POA: Diagnosis not present

## 2021-02-22 DIAGNOSIS — Z23 Encounter for immunization: Secondary | ICD-10-CM | POA: Diagnosis not present

## 2021-03-01 DIAGNOSIS — M65342 Trigger finger, left ring finger: Secondary | ICD-10-CM | POA: Diagnosis not present

## 2021-03-01 DIAGNOSIS — M19042 Primary osteoarthritis, left hand: Secondary | ICD-10-CM | POA: Diagnosis not present

## 2021-03-22 DIAGNOSIS — M19041 Primary osteoarthritis, right hand: Secondary | ICD-10-CM | POA: Diagnosis not present

## 2021-03-22 DIAGNOSIS — M65342 Trigger finger, left ring finger: Secondary | ICD-10-CM | POA: Diagnosis not present

## 2021-04-19 DIAGNOSIS — Z981 Arthrodesis status: Secondary | ICD-10-CM | POA: Diagnosis not present

## 2021-05-04 DIAGNOSIS — R030 Elevated blood-pressure reading, without diagnosis of hypertension: Secondary | ICD-10-CM | POA: Diagnosis not present

## 2021-05-04 DIAGNOSIS — E785 Hyperlipidemia, unspecified: Secondary | ICD-10-CM | POA: Diagnosis not present

## 2021-05-04 DIAGNOSIS — G4700 Insomnia, unspecified: Secondary | ICD-10-CM | POA: Diagnosis not present

## 2021-05-06 DIAGNOSIS — R030 Elevated blood-pressure reading, without diagnosis of hypertension: Secondary | ICD-10-CM | POA: Diagnosis not present

## 2021-05-06 DIAGNOSIS — Z87891 Personal history of nicotine dependence: Secondary | ICD-10-CM | POA: Diagnosis not present

## 2021-05-06 DIAGNOSIS — G4700 Insomnia, unspecified: Secondary | ICD-10-CM | POA: Diagnosis not present

## 2021-05-06 DIAGNOSIS — E785 Hyperlipidemia, unspecified: Secondary | ICD-10-CM | POA: Diagnosis not present

## 2021-05-17 DIAGNOSIS — J988 Other specified respiratory disorders: Secondary | ICD-10-CM | POA: Diagnosis not present

## 2021-05-20 DIAGNOSIS — R030 Elevated blood-pressure reading, without diagnosis of hypertension: Secondary | ICD-10-CM | POA: Diagnosis not present

## 2021-05-20 DIAGNOSIS — G4700 Insomnia, unspecified: Secondary | ICD-10-CM | POA: Diagnosis not present

## 2021-09-15 DIAGNOSIS — G5701 Lesion of sciatic nerve, right lower limb: Secondary | ICD-10-CM | POA: Diagnosis not present

## 2021-09-15 DIAGNOSIS — M5441 Lumbago with sciatica, right side: Secondary | ICD-10-CM | POA: Diagnosis not present

## 2021-09-22 DIAGNOSIS — M5441 Lumbago with sciatica, right side: Secondary | ICD-10-CM | POA: Diagnosis not present

## 2021-09-29 DIAGNOSIS — M25551 Pain in right hip: Secondary | ICD-10-CM | POA: Diagnosis not present

## 2021-09-29 DIAGNOSIS — M5136 Other intervertebral disc degeneration, lumbar region: Secondary | ICD-10-CM | POA: Diagnosis not present

## 2021-10-11 DIAGNOSIS — M47816 Spondylosis without myelopathy or radiculopathy, lumbar region: Secondary | ICD-10-CM | POA: Diagnosis not present

## 2021-10-19 DIAGNOSIS — L820 Inflamed seborrheic keratosis: Secondary | ICD-10-CM | POA: Diagnosis not present

## 2021-10-19 DIAGNOSIS — B351 Tinea unguium: Secondary | ICD-10-CM | POA: Diagnosis not present

## 2021-10-19 DIAGNOSIS — L905 Scar conditions and fibrosis of skin: Secondary | ICD-10-CM | POA: Diagnosis not present

## 2021-10-19 DIAGNOSIS — L570 Actinic keratosis: Secondary | ICD-10-CM | POA: Diagnosis not present

## 2021-10-19 DIAGNOSIS — L821 Other seborrheic keratosis: Secondary | ICD-10-CM | POA: Diagnosis not present

## 2021-10-19 DIAGNOSIS — D485 Neoplasm of uncertain behavior of skin: Secondary | ICD-10-CM | POA: Diagnosis not present

## 2021-11-08 DIAGNOSIS — M47816 Spondylosis without myelopathy or radiculopathy, lumbar region: Secondary | ICD-10-CM | POA: Diagnosis not present

## 2021-11-18 DIAGNOSIS — S70261A Insect bite (nonvenomous), right hip, initial encounter: Secondary | ICD-10-CM | POA: Diagnosis not present

## 2021-11-18 DIAGNOSIS — W57XXXA Bitten or stung by nonvenomous insect and other nonvenomous arthropods, initial encounter: Secondary | ICD-10-CM | POA: Diagnosis not present

## 2021-12-13 DIAGNOSIS — W57XXXA Bitten or stung by nonvenomous insect and other nonvenomous arthropods, initial encounter: Secondary | ICD-10-CM | POA: Diagnosis not present

## 2021-12-13 DIAGNOSIS — S70361A Insect bite (nonvenomous), right thigh, initial encounter: Secondary | ICD-10-CM | POA: Diagnosis not present

## 2022-02-03 DIAGNOSIS — E785 Hyperlipidemia, unspecified: Secondary | ICD-10-CM | POA: Diagnosis not present

## 2022-02-03 DIAGNOSIS — R7309 Other abnormal glucose: Secondary | ICD-10-CM | POA: Diagnosis not present

## 2022-02-03 DIAGNOSIS — Z6835 Body mass index (BMI) 35.0-35.9, adult: Secondary | ICD-10-CM | POA: Diagnosis not present

## 2022-02-08 DIAGNOSIS — E785 Hyperlipidemia, unspecified: Secondary | ICD-10-CM | POA: Diagnosis not present

## 2022-02-08 DIAGNOSIS — R7309 Other abnormal glucose: Secondary | ICD-10-CM | POA: Diagnosis not present

## 2022-03-08 DIAGNOSIS — I10 Essential (primary) hypertension: Secondary | ICD-10-CM | POA: Diagnosis not present

## 2022-03-08 DIAGNOSIS — G4700 Insomnia, unspecified: Secondary | ICD-10-CM | POA: Diagnosis not present

## 2022-08-01 DIAGNOSIS — R030 Elevated blood-pressure reading, without diagnosis of hypertension: Secondary | ICD-10-CM | POA: Diagnosis not present

## 2022-08-01 DIAGNOSIS — Z1331 Encounter for screening for depression: Secondary | ICD-10-CM | POA: Diagnosis not present

## 2022-08-01 DIAGNOSIS — E785 Hyperlipidemia, unspecified: Secondary | ICD-10-CM | POA: Diagnosis not present

## 2022-08-01 DIAGNOSIS — Z0001 Encounter for general adult medical examination with abnormal findings: Secondary | ICD-10-CM | POA: Diagnosis not present

## 2022-08-01 DIAGNOSIS — G4700 Insomnia, unspecified: Secondary | ICD-10-CM | POA: Diagnosis not present

## 2022-09-08 DIAGNOSIS — Z0189 Encounter for other specified special examinations: Secondary | ICD-10-CM | POA: Diagnosis not present

## 2022-09-08 DIAGNOSIS — R799 Abnormal finding of blood chemistry, unspecified: Secondary | ICD-10-CM | POA: Diagnosis not present

## 2022-09-08 DIAGNOSIS — R739 Hyperglycemia, unspecified: Secondary | ICD-10-CM | POA: Diagnosis not present

## 2022-09-08 DIAGNOSIS — E785 Hyperlipidemia, unspecified: Secondary | ICD-10-CM | POA: Diagnosis not present

## 2022-09-29 DIAGNOSIS — I10 Essential (primary) hypertension: Secondary | ICD-10-CM | POA: Diagnosis not present

## 2022-09-29 DIAGNOSIS — E785 Hyperlipidemia, unspecified: Secondary | ICD-10-CM | POA: Diagnosis not present

## 2022-11-22 DIAGNOSIS — L820 Inflamed seborrheic keratosis: Secondary | ICD-10-CM | POA: Diagnosis not present

## 2022-11-22 DIAGNOSIS — L918 Other hypertrophic disorders of the skin: Secondary | ICD-10-CM | POA: Diagnosis not present

## 2022-11-22 DIAGNOSIS — L570 Actinic keratosis: Secondary | ICD-10-CM | POA: Diagnosis not present

## 2022-11-22 DIAGNOSIS — L308 Other specified dermatitis: Secondary | ICD-10-CM | POA: Diagnosis not present

## 2022-11-22 DIAGNOSIS — L218 Other seborrheic dermatitis: Secondary | ICD-10-CM | POA: Diagnosis not present

## 2022-11-22 DIAGNOSIS — I8391 Asymptomatic varicose veins of right lower extremity: Secondary | ICD-10-CM | POA: Diagnosis not present

## 2022-11-22 DIAGNOSIS — D1801 Hemangioma of skin and subcutaneous tissue: Secondary | ICD-10-CM | POA: Diagnosis not present

## 2022-11-22 DIAGNOSIS — I8392 Asymptomatic varicose veins of left lower extremity: Secondary | ICD-10-CM | POA: Diagnosis not present

## 2022-11-22 DIAGNOSIS — L821 Other seborrheic keratosis: Secondary | ICD-10-CM | POA: Diagnosis not present

## 2023-02-13 DIAGNOSIS — Z23 Encounter for immunization: Secondary | ICD-10-CM | POA: Diagnosis not present

## 2023-02-13 DIAGNOSIS — E785 Hyperlipidemia, unspecified: Secondary | ICD-10-CM | POA: Diagnosis not present

## 2023-02-13 DIAGNOSIS — Z0000 Encounter for general adult medical examination without abnormal findings: Secondary | ICD-10-CM | POA: Diagnosis not present

## 2023-02-13 DIAGNOSIS — R7309 Other abnormal glucose: Secondary | ICD-10-CM | POA: Diagnosis not present

## 2023-03-01 DIAGNOSIS — I10 Essential (primary) hypertension: Secondary | ICD-10-CM | POA: Diagnosis not present

## 2023-03-01 DIAGNOSIS — R1032 Left lower quadrant pain: Secondary | ICD-10-CM | POA: Diagnosis not present

## 2023-03-05 DIAGNOSIS — E785 Hyperlipidemia, unspecified: Secondary | ICD-10-CM | POA: Diagnosis not present

## 2023-03-05 DIAGNOSIS — I10 Essential (primary) hypertension: Secondary | ICD-10-CM | POA: Diagnosis not present

## 2023-03-11 DIAGNOSIS — I10 Essential (primary) hypertension: Secondary | ICD-10-CM | POA: Diagnosis not present

## 2023-03-11 DIAGNOSIS — E785 Hyperlipidemia, unspecified: Secondary | ICD-10-CM | POA: Diagnosis not present

## 2023-04-18 DIAGNOSIS — R0602 Shortness of breath: Secondary | ICD-10-CM | POA: Diagnosis not present

## 2023-04-18 DIAGNOSIS — R079 Chest pain, unspecified: Secondary | ICD-10-CM | POA: Diagnosis not present

## 2023-04-18 DIAGNOSIS — R9431 Abnormal electrocardiogram [ECG] [EKG]: Secondary | ICD-10-CM | POA: Diagnosis not present

## 2023-04-27 ENCOUNTER — Ambulatory Visit: Payer: PPO | Attending: Internal Medicine | Admitting: Internal Medicine

## 2023-04-27 VITALS — BP 170/86 | HR 82 | Ht 68.0 in | Wt 224.4 lb

## 2023-04-27 DIAGNOSIS — I700 Atherosclerosis of aorta: Secondary | ICD-10-CM | POA: Diagnosis not present

## 2023-04-27 DIAGNOSIS — R072 Precordial pain: Secondary | ICD-10-CM | POA: Insufficient documentation

## 2023-04-27 DIAGNOSIS — R011 Cardiac murmur, unspecified: Secondary | ICD-10-CM | POA: Diagnosis not present

## 2023-04-27 DIAGNOSIS — I10 Essential (primary) hypertension: Secondary | ICD-10-CM | POA: Insufficient documentation

## 2023-04-27 DIAGNOSIS — E785 Hyperlipidemia, unspecified: Secondary | ICD-10-CM | POA: Insufficient documentation

## 2023-04-27 DIAGNOSIS — I209 Angina pectoris, unspecified: Secondary | ICD-10-CM | POA: Diagnosis not present

## 2023-04-27 DIAGNOSIS — R9431 Abnormal electrocardiogram [ECG] [EKG]: Secondary | ICD-10-CM | POA: Diagnosis not present

## 2023-04-27 DIAGNOSIS — R079 Chest pain, unspecified: Secondary | ICD-10-CM | POA: Diagnosis not present

## 2023-04-27 MED ORDER — CARVEDILOL 6.25 MG PO TABS: 6.2500 mg | ORAL_TABLET | Freq: Two times a day (BID) | ORAL | 3 refills | Status: DC

## 2023-04-27 MED ORDER — NITROGLYCERIN 0.4 MG SL SUBL: 0.4000 mg | SUBLINGUAL_TABLET | SUBLINGUAL | 3 refills | Status: DC | PRN

## 2023-04-27 MED ORDER — METOPROLOL TARTRATE 25 MG PO TABS: ORAL_TABLET | ORAL | 0 refills | Status: DC

## 2023-04-27 NOTE — Progress Notes (Signed)
Cardiology Office Note:  .    Date:  04/27/2023  ID:  Isaac [name], DOB 1946/03/16, MRN [id number] PCP: Farris Has, MD  [facility] Providers Cardiologist:  Christell Constant, MD     CC: chest pain Consulted for the evaluation of CP at the behest of Isaac [name]   History of Present Illness: .    Isaac [name] is a 77 y.o. male  with a history of RBBB, Rare PVCs, Aortic atherosclerosis and radiographic evidence of PAD who presents for angina pectoris.  Isaac [name], a 77 year old individual presents with a chief complaint of chest discomfort and fatigue during physical exertion. The symptoms began in late September while mowing the lawn, an activity previously performed without issue. He describes a sensation of chest tightness, which was initially attributed to aging. However, the frequency and intensity of these episodes increased over time, particularly during gym workouts. He reports that within three minutes of using the elliptical machine, the chest tightness would return, necessitating a pause in activity and deep breathing exercises. This pattern repeated itself several times during each gym visit, ultimately leading to the cessation of gym attendance for the past three weeks.  He also notes similar episodes of chest tightness during play with his dog, again requiring rest and deep breathing. He denies any chest discomfort at rest. He has a history of premature ventricular contractions and right bundle branch block, as well as aortic atherosclerosis and peripheral arterial disease. He is currently on aspirin and cholesterol medication. He denies any abdominal discomfort or diarrhea since 2018.  Relevant histories: .  Social- has a daughter present for visit, has dog ROS: As per HPI.  Physical Exam:    VS:  BP (!) 170/86   Pulse 82   Ht 5\' 8"  (1.727 m)   Wt 224 lb 6.4 oz (101.8 kg)   SpO2 94%   BMI 34.12 kg/m    Wt Readings from Last 3 Encounters:  04/27/23  224 lb 6.4 oz (101.8 kg)  03/10/13 225 lb (102.1 kg)    Blood pressure was taken after he jumped off the exam table and hurt his ankle  Gen: no distress,   Neck: No JVD, no carotid bruit Ears: Bilateral Isaac [name] Sign Cardiac: No Rubs or Gallops, systolic crescendo murmur, RRR +2 radial pulses Respiratory: Clear to auscultation bilaterally, normal effort, normal  respiratory rate GI: Soft, nontender, non-distended  MS: No  edema;  moves all extremities Integument: Skin feels warm Neuro:  At time of evaluation, alert and oriented to person/place/time/situation  Psych: Normal affect, patient feels ook   ASSESSMENT AND PLAN: .    Angina Pectoris - Intermittent chest tightness during physical exertion, suggestive of coronary artery disease. High clinical suspicion due to risk factors including aortic atherosclerosis, peripheral arterial disease (reviewed his prior 2018 CT with him), and hyperlipidemia. Discussed potential need for heart catheterization, stent placement, or bypass surgery. Explained risks of heart catheterization (1 in 2000 for major complications, 0.5% risk of bleeding with radial access). Emphasized benefits of early intervention and starting with medication therapy. - Order cardiac CT with contrast - Administer nitroglycerin PRN for chest discomfort - Start beta blocker based on blood pressure (coreg 6.25 mg PO BID- this will also suppress PVCs, his RBBB is without high grade AV block) - Order echocardiogram to evaluate new systolic murmur - Follow up in three months if microvascular disease only  Aortic Atherosclerosis - Calcium buildup in the aorta noted on previous imaging, contributing to  cardiovascular risk. - Continue aspirin therapy  Peripheral Arterial Disease - Blockages in leg arteries noted on previous imaging, contributing to cardiovascular risk. - LDL goal < 70, he is asymptomatic  Hyperlipidemia - Elevated cholesterol levels contributing to cardiovascular  risk, currently managed with medication.  Follow-up - Follow up in three months - Schedule cardiac CT and echocardiogram - Discuss results and potential need for heart catheterization or other interventions based on findings.  I think there is a significant chance we bring him back after two anti-anginal's to discuss LHC    [name], MD FASE [facility] Cardiologist [facility]  [address] [place], [address] [place], [place] [address] [phone]  5:05 PM

## 2023-04-27 NOTE — Patient Instructions (Addendum)
Medication Instructions:  Your physician has recommended you make the following change in your medication:  START: carvedilol (Coreg) 6.25 mg by mouth twice daily START: Nitroglycerin 0.4 mg under your tongue every 5 minutes as needed for Chest Pain. May take up to 3 tablets in 15 min.   *If you need a refill on your cardiac medications before your next appointment, please call your pharmacy*   Lab Work: BMP If you have labs (blood work) drawn today and your tests are completely normal, you will receive your results only by: MyChart Message (if you have MyChart) OR A paper copy in the mail If you have any lab test that is abnormal or we need to change your treatment, we will call you to review the results.   Testing/Procedures: Your physician has requested that you have an echocardiogram. Echocardiography is a painless test that uses sound waves to create images of your heart. It provides your doctor with information about the size and shape of your heart and how well your heart's chambers and valves are working. This procedure takes approximately one hour. There are no restrictions for this procedure. Please do NOT wear cologne, perfume, aftershave, or lotions (deodorant is allowed). Please arrive 15 minutes prior to your appointment time.  Please note: We ask at that you not bring children with you during ultrasound (echo/ vascular) testing. Due to room size and safety concerns, children are not allowed in the ultrasound rooms during exams. Our front office staff cannot provide observation of children in our lobby area while testing is being conducted. An adult accompanying a patient to their appointment will only be allowed in the ultrasound room at the discretion of the ultrasound technician under special circumstances. We apologize for any inconvenience.   Your physician has requested that you have cardiac CT. Cardiac computed tomography (CT) is a painless test that uses an x-ray machine  to take clear, detailed pictures of your heart. For further information please visit [email or web address] Please follow instruction sheet as given.     Follow-Up: At [facility], you and your health needs are our priority.  As part of our continuing mission to provide you with exceptional heart care, we have created designated Provider Care Teams.  These Care Teams include your primary Cardiologist (physician) and Advanced Practice Providers (APPs -  Physician Assistants and Nurse Practitioners) who all work together to provide you with the care you need, when you need it.    Your next appointment:   3 month(s)  Provider:   Christell Constant, MD  or Ronie Spies, PA-C, Jacolyn Reedy, PA-C, or Tereso Newcomer, PA-C       Other Instructions  Your cardiac CT will be scheduled at one of the below locations:   [facility] [address] [place], [place] [address] [phone]  OR  [facility] [address] [place], [place] [address] [phone]  OR   [facility] [address] [place], [place] [address] [phone]  If scheduled at [facility], please arrive at the [facility] and Children's Entrance (Entrance C2) of [facility] 30 minutes prior to test start time. You can use the FREE valet parking offered at entrance C (encouraged to control the heart rate for the test)  Proceed to the [facility] Radiology Department (first floor) to check-in and test prep.  All radiology patients and guests should use entrance C2 at [facility], accessed from [place]  [employer], even though the hospital's physical address listed is [address]    If scheduled at [facility] or [facility], please arrive 15 mins early for check-in and test prep.  There is spacious parking and easy access to the radiology  department from the [facility] entrance. Please enter here and check-in with the desk attendant.   Please follow these instructions carefully (unless otherwise directed):  An IV will be required for this test and Nitroglycerin will be given.  Hold all erectile dysfunction medications at least 3 days (72 hrs) prior to test. (Ie viagra, cialis, sildenafil, tadalafil, etc)   On the Night Before the Test: Be sure to Drink plenty of water. Do not consume any caffeinated/decaffeinated beverages or chocolate 12 hours prior to your test. Do not take any antihistamines 12 hours prior to your test.   On the Day of the Test: Drink plenty of water until 1 hour prior to the test. Do not eat any food 1 hour prior to test. You may take your regular medications prior to the test.  Take metoprolol (Lopressor) 25 mg two hours prior to test. If you take Furosemide/Hydrochlorothiazide/Spironolactone, please HOLD on the morning of the test.        After the Test: Drink plenty of water. After receiving IV contrast, you may experience a mild flushed feeling. This is normal. On occasion, you may experience a mild rash up to 24 hours after the test. This is not dangerous. If this occurs, you can take Benadryl 25 mg and increase your fluid intake. If you experience trouble breathing, this can be serious. If it is severe call 911 IMMEDIATELY. If it is mild, please call our office. If you take any of these medications: Glipizide/Metformin, Avandament, Glucavance, please do not take 48 hours after completing test unless otherwise instructed.  We will call to schedule your test 2-4 weeks out understanding that some insurance companies will need an authorization prior to the service being performed.   For more information and frequently asked questions, please visit our website : [email or web address]  For non-scheduling related questions, please contact the cardiac imaging nurse navigator  should you have any questions/concerns: Cardiac Imaging Nurse Navigators Direct Office Dial: [phone]   For scheduling needs, including cancellations and rescheduling, please call [place], [phone].

## 2023-04-28 LAB — BASIC METABOLIC PANEL
BUN/Creatinine Ratio: 23 (ref 10–24)
BUN: 20 mg/dL (ref 8–27)
CO2: 23 mmol/L (ref 20–29)
Calcium: 9.7 mg/dL (ref 8.6–10.2)
Chloride: 103 mmol/L (ref 96–106)
Creatinine, Ser: 0.88 mg/dL (ref 0.76–1.27)
Glucose: 101 mg/dL — ABNORMAL HIGH (ref 70–99)
Potassium: 4.7 mmol/L (ref 3.5–5.2)
Sodium: 141 mmol/L (ref 134–144)
eGFR: 88 mL/min/{1.73_m2} (ref >59)

## 2023-04-29 ENCOUNTER — Other Ambulatory Visit: Payer: Self-pay | Admitting: Internal Medicine

## 2023-04-30 ENCOUNTER — Telehealth: Payer: Self-pay | Admitting: Internal Medicine

## 2023-04-30 DIAGNOSIS — I700 Atherosclerosis of aorta: Secondary | ICD-10-CM

## 2023-04-30 DIAGNOSIS — E785 Hyperlipidemia, unspecified: Secondary | ICD-10-CM

## 2023-04-30 NOTE — Telephone Encounter (Signed)
  Per MyChart scheduling message:  I want to thank Dr. Izora Ribas for meeting with me and my daughter, yesterday Nov 22nd. Dr. Izora Ribas made me feel so much better about my visit, as I was scared and concern as I never have had a problem like this. I realize that we still have a way to go to see just what the heart problem is but he has made me feel better as to what is next. I would like to let him know that my foot pain is gone and I am sure I stepped offer the exam table and bent the foot. Also please let him know that my blood pressure this morning was 122/64/78. I know he was concerned as it was high with the pain in the foot and news of what I need to do. PLEASE let him know how much I appreciated how he spoke to me and how he explained the next procedures. His staff was great and wishing all of you a Happy Thanksgiving and happy holidays for the rest of the year. Sincerely Anselm C. Bihl, Nov 24, 1945

## 2023-05-08 ENCOUNTER — Telehealth (HOSPITAL_COMMUNITY): Payer: Self-pay | Admitting: *Deleted

## 2023-05-08 NOTE — Telephone Encounter (Signed)
Reaching out to patient to offer assistance regarding upcoming cardiac imaging study; pt verbalizes understanding of appt date/time, parking situation and where to check in, pre-test NPO status and medications ordered, and verified current allergies; name and call back number provided for further questions should they arise Hayley Sharpe RN Navigator Cardiac Imaging [facility] [phone] office [phone] cell  

## 2023-05-09 ENCOUNTER — Ambulatory Visit (HOSPITAL_COMMUNITY)
Admission: RE | Admit: 2023-05-09 | Discharge: 2023-05-09 | Disposition: A | Discharge status: Home / Self Care | Payer: PPO | Source: Ambulatory Visit | Attending: Internal Medicine | Admitting: Internal Medicine

## 2023-05-09 DIAGNOSIS — I2510 Atherosclerotic heart disease of native coronary artery without angina pectoris: Secondary | ICD-10-CM

## 2023-05-09 DIAGNOSIS — R072 Precordial pain: Secondary | ICD-10-CM | POA: Insufficient documentation

## 2023-05-09 MED ORDER — IOHEXOL 350 MG/ML SOLN
95.0000 mL | Freq: Once | INTRAVENOUS | Status: AC | PRN
  Administered 2023-05-09: 95 mL via INTRAVENOUS

## 2023-05-09 MED ORDER — NITROGLYCERIN 0.4 MG SL SUBL
0.8000 mg | SUBLINGUAL_TABLET | Freq: Once | SUBLINGUAL | Status: AC
  Administered 2023-05-09: 0.8 mg via SUBLINGUAL

## 2023-05-09 MED ORDER — NITROGLYCERIN 0.4 MG SL SUBL
SUBLINGUAL_TABLET | SUBLINGUAL | Status: AC
  Filled 2023-05-09: qty 2

## 2023-05-21 ENCOUNTER — Ambulatory Visit (HOSPITAL_COMMUNITY)
Admission: RE | Admit: 2023-05-21 | Discharge: 2023-05-21 | Disposition: A | Discharge status: Home / Self Care | Payer: PPO | Source: Ambulatory Visit | Attending: Internal Medicine | Admitting: Internal Medicine

## 2023-05-21 DIAGNOSIS — I2510 Atherosclerotic heart disease of native coronary artery without angina pectoris: Secondary | ICD-10-CM | POA: Diagnosis not present

## 2023-05-21 DIAGNOSIS — R931 Abnormal findings on diagnostic imaging of heart and coronary circulation: Secondary | ICD-10-CM | POA: Insufficient documentation

## 2023-05-21 DIAGNOSIS — R072 Precordial pain: Secondary | ICD-10-CM | POA: Diagnosis not present

## 2023-05-21 NOTE — Telephone Encounter (Signed)
Called pt scheduled OV 05/22/23 at 10 am.

## 2023-05-22 ENCOUNTER — Encounter: Payer: Self-pay | Admitting: Internal Medicine

## 2023-05-22 ENCOUNTER — Ambulatory Visit: Payer: PPO | Attending: Internal Medicine | Admitting: Internal Medicine

## 2023-05-22 VITALS — BP 150/84 | HR 75 | Ht 68.0 in | Wt 222.0 lb

## 2023-05-22 DIAGNOSIS — R079 Chest pain, unspecified: Secondary | ICD-10-CM | POA: Diagnosis not present

## 2023-05-22 DIAGNOSIS — I700 Atherosclerosis of aorta: Secondary | ICD-10-CM

## 2023-05-22 DIAGNOSIS — I10 Essential (primary) hypertension: Secondary | ICD-10-CM

## 2023-05-22 DIAGNOSIS — R9431 Abnormal electrocardiogram [ECG] [EKG]: Secondary | ICD-10-CM | POA: Diagnosis not present

## 2023-05-22 DIAGNOSIS — I2510 Atherosclerotic heart disease of native coronary artery without angina pectoris: Secondary | ICD-10-CM

## 2023-05-22 LAB — LDL CHOLESTEROL, DIRECT

## 2023-05-22 MED ORDER — ISOSORBIDE MONONITRATE ER 30 MG PO TB24: 30.0000 mg | ORAL_TABLET | Freq: Every day | ORAL | 3 refills | Status: DC

## 2023-05-22 NOTE — Patient Instructions (Signed)
Medication Instructions:  Your physician has recommended you make the following change in your medication:  START: isosorbide mononitrate (Imdur) 30 mg by mouth once daily  *If you need a refill on your cardiac medications before your next appointment, please call your pharmacy*   Lab Work: BMP, CBC, LDL Direct, LPA  If you have labs (blood work) drawn today and your tests are completely normal, you will receive your results only by: MyChart Message (if you have MyChart) OR A paper copy in the mail If you have any lab test that is abnormal or we need to change your treatment, we will call you to review the results.   Testing/Procedures: NONE   Follow-Up:As scheduled  At [facility] you and your health needs are our priority.  As part of our continuing mission to provide you with exceptional heart care, we have created designated Provider Care Teams.  These Care Teams include your primary Cardiologist (physician) and Advanced Practice Providers (APPs -  Physician Assistants and Nurse Practitioners) who all work together to provide you with the care you need, when you need it.

## 2023-05-22 NOTE — H&P (View-Only) (Signed)
 Cardiology Office Note:  .    Date:  05/22/2023  ID:  [name], DOB 14-Dec-1945, MRN [id number] PCP: Farris Has, MD   [facility] Providers Cardiologist:  Christell Constant, MD     CC: DOD add on- CT findings  History of Present Illness: .    Isaac [name] is a 77 y.o. male  with a history of RBBB, Rare PVCs, Aortic atherosclerosis and radiographic evidence of PAD who presents for angina pectoris. 2024: CCTA read delayed.  I over-read this study 05/21/23 due to delay.  Found to have CTO RCA.  Called- still having residual pain.  Patient notes that he is doing ok.   Since last visit notes that he does not have need for the nitroglycerin . There are no interval hospital/ED visit.   EKG showed no changes  He has angina when he plays with the dog.  He has angina when he is at the gym.  2/10 pain, did not get nitroglycerin.  No SOB/DOE and no PND/Orthopnea.  No weight gain or leg swelling.  No palpitations or syncope .  Relevant histories: .  Social- has a daughter present for visit, has dog ROS: As per HPI.  Physical Exam:    VS:  BP (!) 150/84 (BP Location: Right Arm, Patient Position: Sitting, Cuff Size: Large)   Pulse 75   Ht 5\' 8"  (1.727 m)   Wt 222 lb (100.7 kg)   SpO2 95%   BMI 33.75 kg/m    Wt Readings from Last 3 Encounters:  05/22/23 222 lb (100.7 kg)  04/27/23 224 lb 6.4 oz (101.8 kg)  03/10/13 225 lb (102.1 kg)    Gen: no distress,   Neck: No JVD Ears: Bilateral Homero Fellers Sign Cardiac: No Rubs or Gallops, systolic crescendo murmur, RRR +2 radial pulses Respiratory: Clear to auscultation bilaterally, normal effort, normal  respiratory rate GI: Soft, nontender, non-distended  MS: No  edema;  moves all extremities Integument: Skin feels warm Neuro:  At time of evaluation, alert and oriented to person/place/time/situation  Psych: Normal affect, patient feels ook   ASSESSMENT AND PLAN: .    Angina Pectoris CAD with CTO RCA - Administer  nitroglycerin PRN for chest discomfort - angina is stable despite Coreg,  - we discussed imdur 30 mg vs this and LHC; he will start 2 anginals; his BP log is reviewed and is 130/80 outside of the hospital - pending echocardiogram to evaluate systolic murmur - BMP and CBC today  Aortic Atherosclerosis - LDL with Lp(a) - Continue aspirin therapy  Peripheral Arterial Disease - Blockages in leg arteries noted on previous imaging, contributing to cardiovascular risk. - LDL goal < 55, he is asymptomatic  Hyperlipidemia - Elevated cholesterol levels contributing to cardiovascular risk, currently managed with medication.  Follow-up - he will go back to the gym on two anti-anginal's - if persistent pain, will get LHC with Dr. [place] for consideration of FFR left system vs CTO RCA intervention - he will let me know either Friday or Monday how his symptoms are doing  Time Spent Directly with Patient:   I have spent a total of 40 minutes with the patient reviewing notes, imaging, EKGs, labs, CT and examining the patient as well as establishing an assessment and plan that was discussed personally with the patient. Reviewed LHC vs anti anginal therapy plan. Reviewed with daughter.    [name], MD FASE [facility] Cardiologist [facility]  [address] [place], [address] [place],  [place] [address] [phone]  10:43 AM   Worsening pain despite medical therapy.  Planned for Cath.  Orders placed.

## 2023-05-22 NOTE — Progress Notes (Addendum)
Cardiology Office Note:  .    Date:  05/22/2023  ID:  Isaac [name], DOB 14-Dec-1945, MRN [id number] PCP: Isaac Has, MD   [facility] Providers Cardiologist:  Isaac Constant, MD     CC: DOD add on- CT findings  History of Present Illness: .    Isaac [name] is a 77 y.o. male  with a history of RBBB, Rare PVCs, Aortic atherosclerosis and radiographic evidence of PAD who presents for angina pectoris. 2024: CCTA read delayed.  I over-read this study 05/21/23 due to delay.  Found to have CTO RCA.  Called- still having residual pain.  Patient notes that he is doing ok.   Since last visit notes that he does not have need for the nitroglycerin . There are no interval hospital/ED visit.   EKG showed no changes  He [name] angina when he plays with the dog.  He [name] angina when he is at the gym.  2/10 pain, did not get nitroglycerin.  No SOB/DOE and no PND/Orthopnea.  No weight gain or leg swelling.  No palpitations or syncope .  Relevant histories: .  Social- [name] a daughter present for visit, [name] dog ROS: As per HPI.  Physical Exam:    VS:  BP (!) 150/84 (BP Location: Right Arm, Patient Position: Sitting, Cuff Size: Large)   Pulse 75   Ht 5\' 8"  (1.727 m)   Wt 222 lb (100.7 kg)   SpO2 95%   BMI 33.75 kg/m    Wt Readings from Last 3 Encounters:  05/22/23 222 lb (100.7 kg)  04/27/23 224 lb 6.4 oz (101.8 kg)  03/10/13 225 lb (102.1 kg)    Gen: no distress,   Neck: No JVD Ears: Bilateral Isaac [name] Sign Cardiac: No Rubs or Gallops, systolic crescendo murmur, RRR +2 radial pulses Respiratory: Clear to auscultation bilaterally, normal effort, normal  respiratory rate GI: Soft, nontender, non-distended  MS: No  edema;  moves all extremities Integument: Skin feels warm Neuro:  At time of evaluation, alert and oriented to person/place/time/situation  Psych: Normal affect, patient feels ook   ASSESSMENT AND PLAN: .    Angina Pectoris CAD with CTO RCA - Administer  nitroglycerin PRN for chest discomfort - angina is stable despite Coreg,  - we discussed imdur 30 mg vs this and LHC; he will start 2 anginals; his BP log is reviewed and is 130/80 outside of the hospital - pending echocardiogram to evaluate systolic murmur - BMP and CBC today  Aortic Atherosclerosis - LDL with Lp(a) - Continue aspirin therapy  Peripheral Arterial Disease - Blockages in leg arteries noted on previous imaging, contributing to cardiovascular risk. - LDL goal < 55, he is asymptomatic  Hyperlipidemia - Elevated cholesterol levels contributing to cardiovascular risk, currently managed with medication.  Follow-up - he will go back to the gym on two anti-anginal's - if persistent pain, will get LHC with Dr. Swaziland for consideration of FFR left system vs CTO RCA intervention - he will let me know either Friday or Monday how his symptoms are doing  Time Spent Directly with Patient:   I have spent a total of 40 minutes with the patient reviewing notes, imaging, EKGs, labs, CT and examining the patient as well as establishing an assessment and plan that was discussed personally with the patient. Reviewed LHC vs anti anginal therapy plan. Reviewed with daughter.    Isaac Lam, MD FASE [facility] Cardiologist [facility]  [address] [place], [address] [place],  [place] [address] [phone]  10:43 AM   Worsening pain despite medical therapy.  Planned for Cath.  Orders placed.

## 2023-05-23 LAB — CBC
Hematocrit: 43.2 % (ref 37.5–51.0)
Hemoglobin: 14.3 g/dL (ref 13.0–17.7)
MCH: 29.9 pg (ref 26.6–33.0)
MCHC: 33.1 g/dL (ref 31.5–35.7)
MCV: 90 fL (ref 79–97)
Platelets: 253 10*3/uL (ref 150–450)
RBC: 4.79 x10E6/uL (ref 4.14–5.80)
RDW: 12.9 % (ref 11.6–15.4)
WBC: 5 10*3/uL (ref 3.4–10.8)

## 2023-05-23 LAB — BASIC METABOLIC PANEL
BUN/Creatinine Ratio: 28 — ABNORMAL HIGH (ref 10–24)
BUN: 24 mg/dL (ref 8–27)
CO2: 21 mmol/L (ref 20–29)
Calcium: 9.4 mg/dL (ref 8.6–10.2)
Chloride: 102 mmol/L (ref 96–106)
Creatinine, Ser: 0.85 mg/dL (ref 0.76–1.27)
Glucose: 105 mg/dL — ABNORMAL HIGH (ref 70–99)
Potassium: 4.9 mmol/L (ref 3.5–5.2)
Sodium: 139 mmol/L (ref 134–144)
eGFR: 89 mL/min/{1.73_m2} (ref >59)

## 2023-05-23 LAB — LIPOPROTEIN A (LPA)

## 2023-05-23 LAB — LDL CHOLESTEROL, DIRECT: LDL Direct: 89 mg/dL (ref 0–99)

## 2023-05-25 ENCOUNTER — Telehealth: Payer: Self-pay

## 2023-05-25 DIAGNOSIS — E785 Hyperlipidemia, unspecified: Secondary | ICD-10-CM

## 2023-05-25 DIAGNOSIS — I700 Atherosclerosis of aorta: Secondary | ICD-10-CM

## 2023-05-25 MED ORDER — PRAVASTATIN SODIUM 40 MG PO TABS: 40.0000 mg | ORAL_TABLET | Freq: Every evening | ORAL | 3 refills | Status: DC

## 2023-05-25 NOTE — Telephone Encounter (Signed)
The patient has been notified of the result and verbalized understanding.  All questions (if any) were answered. Arvid Right Royale Swamy, RN 05/25/2023 5:07 PM    Increased pravastatin to 40 mg as pt only taking 20 mg previously.   Lab orders released for future draw.   Pt updating MD; did not go to the gym walked around neighborhood at a fast past.  After 3 minutes developed chest tightness and shortness of breath.  Still has dull CP rates 1-2 as previously noted.  Also reports dull HA with imdur but can tolerate.  Wants to know if he should give imdur more time or proceed with heart cath.  Advised pt will send message to MD to advise.

## 2023-05-25 NOTE — Telephone Encounter (Signed)
-----   Message from Christell Constant sent at 05/23/2023  1:50 PM EST ----- Results: Labs stable, LDL above goal Plan: Increase to pravastatin 80 mg and labs in three months  Christell Constant, MD

## 2023-05-25 NOTE — Addendum Note (Signed)
Addended by: Macie Burows on: 05/25/2023 05:06 PM   Modules accepted: Orders

## 2023-05-28 NOTE — Telephone Encounter (Signed)
Left a message to call back.

## 2023-05-28 NOTE — Telephone Encounter (Signed)
Pt returned call.  Would like to have Heart Cath at the beginning of the year after the Holiday's.  Would prefer not to have cath on a Wednesday if possible.  I advised pt I will look into scheduling cath and will call back with specifics after Christmas.

## 2023-05-29 NOTE — Telephone Encounter (Signed)
Pt scheduled for LHC on 06/01/23 with Dr. Swaziland.

## 2023-05-29 NOTE — Telephone Encounter (Signed)
Spoke with MD would like Heart Cath as soon as possible.  Scheduled pt for 06/01/23 at 10:30 am with Dr. Swaziland.  Called pt reviewed cath instructions also placed on my chart for review.  Pt wants to ensure procedure is approved I have sent a urgent request to pre-cert.

## 2023-05-29 NOTE — Addendum Note (Signed)
Addended by: Macie Burows on: 05/29/2023 08:42 AM   Modules accepted: Orders

## 2023-05-31 NOTE — Addendum Note (Signed)
Addended by: [name] A on: 05/31/2023 04:55 PM   Modules accepted: Orders

## 2023-06-01 ENCOUNTER — Encounter (HOSPITAL_COMMUNITY)
Admission: RE | Disposition: A | Discharge status: Home / Self Care | Payer: Self-pay | Source: Home / Self Care | Attending: Cardiology

## 2023-06-01 ENCOUNTER — Ambulatory Visit (HOSPITAL_COMMUNITY)
Admission: RE | Admit: 2023-06-01 | Discharge: 2023-06-01 | Disposition: A | Discharge status: Home / Self Care | Payer: PPO | Attending: Cardiology | Admitting: Cardiology

## 2023-06-01 ENCOUNTER — Encounter (HOSPITAL_COMMUNITY): Payer: Self-pay | Admitting: Vascular Surgery

## 2023-06-01 ENCOUNTER — Other Ambulatory Visit: Payer: Self-pay

## 2023-06-01 DIAGNOSIS — I2582 Chronic total occlusion of coronary artery: Secondary | ICD-10-CM | POA: Diagnosis not present

## 2023-06-01 DIAGNOSIS — I700 Atherosclerosis of aorta: Secondary | ICD-10-CM | POA: Diagnosis not present

## 2023-06-01 DIAGNOSIS — Z7982 Long term (current) use of aspirin: Secondary | ICD-10-CM | POA: Diagnosis not present

## 2023-06-01 DIAGNOSIS — I739 Peripheral vascular disease, unspecified: Secondary | ICD-10-CM | POA: Insufficient documentation

## 2023-06-01 DIAGNOSIS — E785 Hyperlipidemia, unspecified: Secondary | ICD-10-CM | POA: Diagnosis not present

## 2023-06-01 DIAGNOSIS — I25119 Atherosclerotic heart disease of native coronary artery with unspecified angina pectoris: Secondary | ICD-10-CM | POA: Diagnosis not present

## 2023-06-01 DIAGNOSIS — Z79899 Other long term (current) drug therapy: Secondary | ICD-10-CM | POA: Insufficient documentation

## 2023-06-01 DIAGNOSIS — I2510 Atherosclerotic heart disease of native coronary artery without angina pectoris: Secondary | ICD-10-CM

## 2023-06-01 HISTORY — PX: LEFT HEART CATH AND CORONARY ANGIOGRAPHY: CATH118249

## 2023-06-01 SURGERY — LEFT HEART CATH AND CORONARY ANGIOGRAPHY
Anesthesia: LOCAL

## 2023-06-01 MED ORDER — MIDAZOLAM HCL 2 MG/2ML IJ SOLN
INTRAMUSCULAR | Status: DC | PRN
  Administered 2023-06-01: 1 mg via INTRAVENOUS

## 2023-06-01 MED ORDER — SODIUM CHLORIDE 0.9 % WEIGHT BASED INFUSION: 3.0000 mL/kg/h | INTRAVENOUS | Status: AC

## 2023-06-01 MED ORDER — HEPARIN (PORCINE) IN NACL 1000-0.9 UT/500ML-% IV SOLN
INTRAVENOUS | Status: DC | PRN
  Administered 2023-06-01 (×2): 500 mL via INTRA_ARTERIAL

## 2023-06-01 MED ORDER — ONDANSETRON HCL 4 MG/2ML IJ SOLN
4.0000 mg | Freq: Four times a day (QID) | INTRAMUSCULAR | Status: DC | PRN
Start: 2023-06-01 — End: 2023-06-01

## 2023-06-01 MED ORDER — ASPIRIN 81 MG PO CHEW
81.0000 mg | CHEWABLE_TABLET | ORAL | Status: DC
  Filled 2023-06-01: qty 1

## 2023-06-01 MED ORDER — ACETAMINOPHEN 325 MG PO TABS
650.0000 mg | ORAL_TABLET | ORAL | Status: DC | PRN
Start: 2023-06-01 — End: 2023-06-01

## 2023-06-01 MED ORDER — MIDAZOLAM HCL 2 MG/2ML IJ SOLN
INTRAMUSCULAR | Status: AC
  Filled 2023-06-01: qty 2

## 2023-06-01 MED ORDER — FENTANYL CITRATE (PF) 100 MCG/2ML IJ SOLN
INTRAMUSCULAR | Status: DC | PRN
  Administered 2023-06-01: 25 ug via INTRAVENOUS

## 2023-06-01 MED ORDER — HEPARIN SODIUM (PORCINE) 1000 UNIT/ML IJ SOLN
INTRAMUSCULAR | Status: AC
  Filled 2023-06-01: qty 10

## 2023-06-01 MED ORDER — SODIUM CHLORIDE 0.9% FLUSH: 3.0000 mL | INTRAVENOUS | Status: DC | PRN

## 2023-06-01 MED ORDER — SODIUM CHLORIDE 0.9% FLUSH: 3.0000 mL | Freq: Two times a day (BID) | INTRAVENOUS | Status: DC

## 2023-06-01 MED ORDER — SODIUM CHLORIDE 0.9 % IV SOLN: 250.0000 mL | INTRAVENOUS | Status: DC | PRN

## 2023-06-01 MED ORDER — FENTANYL CITRATE (PF) 100 MCG/2ML IJ SOLN
INTRAMUSCULAR | Status: AC
  Filled 2023-06-01: qty 2

## 2023-06-01 MED ORDER — IOHEXOL 350 MG/ML SOLN
INTRAVENOUS | Status: DC | PRN
  Administered 2023-06-01: 40 mL via INTRA_ARTERIAL

## 2023-06-01 MED ORDER — HEPARIN SODIUM (PORCINE) 1000 UNIT/ML IJ SOLN
INTRAMUSCULAR | Status: DC | PRN
  Administered 2023-06-01: 5000 [IU] via INTRAVENOUS

## 2023-06-01 MED ORDER — LIDOCAINE HCL (PF) 1 % IJ SOLN
INTRAMUSCULAR | Status: AC
Start: 2023-06-01 — End: ?
  Filled 2023-06-01: qty 30

## 2023-06-01 MED ORDER — ASPIRIN 81 MG PO CHEW
81.0000 mg | CHEWABLE_TABLET | ORAL | Status: AC
  Administered 2023-06-01: 81 mg via ORAL

## 2023-06-01 MED ORDER — VERAPAMIL HCL 2.5 MG/ML IV SOLN
INTRAVENOUS | Status: AC
  Filled 2023-06-01: qty 2

## 2023-06-01 MED ORDER — LABETALOL HCL 5 MG/ML IV SOLN: 10.0000 mg | INTRAVENOUS | Status: DC | PRN

## 2023-06-01 MED ORDER — SODIUM CHLORIDE 0.9 % WEIGHT BASED INFUSION: 1.0000 mL/kg/h | INTRAVENOUS | Status: DC

## 2023-06-01 MED ORDER — HYDRALAZINE HCL 20 MG/ML IJ SOLN: 10.0000 mg | INTRAMUSCULAR | Status: DC | PRN

## 2023-06-01 MED ORDER — VERAPAMIL HCL 2.5 MG/ML IV SOLN
INTRAVENOUS | Status: DC | PRN
  Administered 2023-06-01: 10 mL via INTRA_ARTERIAL

## 2023-06-01 MED ORDER — LIDOCAINE HCL (PF) 1 % IJ SOLN
INTRAMUSCULAR | Status: DC | PRN
  Administered 2023-06-01: 2 mL

## 2023-06-01 SURGICAL SUPPLY — 6 items
CATH 5FR JL3.5 JR4 ANG PIG MP (CATHETERS) IMPLANT
DEVICE RAD COMP TR BAND LRG (VASCULAR PRODUCTS) IMPLANT
GLIDESHEATH SLEND SS 6F .021 (SHEATH) IMPLANT
GUIDEWIRE INQWIRE 1.5J.035X260 (WIRE) IMPLANT
INQWIRE 1.5J .035X260CM (WIRE) ×1
PACK CARDIAC CATHETERIZATION (CUSTOM PROCEDURE TRAY) ×1 IMPLANT

## 2023-06-01 NOTE — Discharge Instructions (Addendum)
Go ahead and take ASA today as you usually do  Drink plenty of fluids for 48 hours and keep wrist elevated at heart level for 24 hours  Radial Site Care   This sheet gives you information about how to care for yourself after your procedure. Your health care provider may also give you more specific instructions. If you have problems or questions, contact your health care provider. What can I expect after the procedure? After the procedure, it is common to have: Bruising and tenderness at the catheter insertion area. Follow these instructions at home: Medicines Take over-the-counter and prescription medicines only as told by your health care provider. Insertion site care Follow instructions from your health care provider about how to take care of your insertion site. Make sure you: Remove your dressing TOMORROW (06/02/23) AT 2:00 PM Check your insertion site every day for signs of infection. Check for: Redness, swelling, or pain. Fluid or blood. Pus or a bad smell. Warmth. Do not take baths, swim, or use a hot tub FOR 7 DAYS You may shower AFTER REMOVAL OF DRESSING Pat the area dry with a clean towel. Do not rub the site. That could cause bleeding. Do not apply powder or lotion to the site. Activity   For 24 hours after the procedure, or as directed by your health care provider: Do not push or pull heavy objects with the affected arm. Do not drive yourself home from the hospital or clinic. You may drive 24 hours after the procedure unless your health care provider tells you not to. Do not operate machinery or power tools. Do not lift anything that is heavier than 10 lb (4.5 kg), or the limit that you are told, until your health care provider says that it is safe.  For 4 days Ask your health care provider when it is okay to: Return to work or school. Resume usual physical activities or sports. Resume sexual activity. General instructions If the catheter site starts to bleed,  raise your arm and put firm pressure on the site for 15 minutes. If the bleeding does not stop, get help right away. This is a medical emergency. If you went home on the same day as your procedure, a responsible adult should be with you for the first 24 hours after you arrive home. Keep all follow-up visits as told by your health care provider. This is important. Contact a health care provider if: You have a fever. You have redness, swelling, or yellow drainage around your insertion site. Get help right away if: You have unusual pain at the radial site. The catheter insertion area swells very fast, raise your arm and put firm pressure on the site for 15 minutes. The insertion area is bleeding, and the bleeding does not stop when you hold steady pressure on the area for 15 minutes.. Your arm or hand becomes pale, cool, tingly, or numb. These symptoms may represent a serious problem that is an emergency. Do not wait to see if the symptoms will go away. Get medical help right away. Call your local emergency services (911 in the [place] Do not drive yourself to the hospital. Summary After the procedure, it is common to have bruising and tenderness at the site. Follow instructions from your health care provider about how to take care of your radial site wound. Check the wound every day for signs of infection. Do not lift anything that is heavier than 10 lb (4.5 kg), or the limit that you are told,  until your health care provider says that it is safe. This information is not intended to replace advice given to you by your health care provider. Make sure you discuss any questions you have with your health care provider. Document Revised: 06/27/2017 Document Reviewed: 06/27/2017 Elsevier Patient Education  2020 [employer].

## 2023-06-01 NOTE — Progress Notes (Addendum)
Per Dr. Elvis Coil request, I assisted with moving gen cards f/u up (was late February) - rescheduled to 06/08/23 in [place] office with Azalee Course PA-C due to appt availability. We will keep echo as scheduled 12/31 at [facility]. Darius Bump relayed that CVTS appt is tentatively 06/15/23 with Dr. Cliffton Asters - they will mail him information and they will also call him if a sooner appointment arises. I outlined these updates on the AVS appointment section including with notation of location differences. I also asked Kenney Houseman to point this out on his AVS when it is printed out.

## 2023-06-01 NOTE — Interval H&P Note (Signed)
History and Physical Interval Note:  06/01/2023 10:55 AM  [name]  has presented today for surgery, with the diagnosis of chest pain and abnormal cardiac CT.  The various methods of treatment have been discussed with the patient and family. After consideration of risks, benefits and other options for treatment, the patient has consented to  Procedure(s): LEFT HEART CATH AND CORONARY ANGIOGRAPHY (N/A) as a surgical intervention.  The patient's history has been reviewed, patient examined, no change in status, stable for surgery.  I have reviewed the patient's chart and labs.  Questions were answered to the patient's satisfaction.    Cath Lab Visit (complete for each Cath Lab visit)  Clinical Evaluation Leading to the Procedure:   ACS: No.  Non-ACS:    Anginal Classification: CCS II  Anti-ischemic medical therapy: Minimal Therapy (1 class of medications)  Non-Invasive Test Results: Intermediate-risk stress test findings: cardiac mortality 1-3%/year  Prior CABG: No previous CABG       Theron Arista [facility] 06/01/2023 10:55 AM

## 2023-06-04 ENCOUNTER — Encounter (HOSPITAL_COMMUNITY): Payer: Self-pay | Admitting: Cardiology

## 2023-06-04 DIAGNOSIS — E785 Hyperlipidemia, unspecified: Secondary | ICD-10-CM | POA: Diagnosis not present

## 2023-06-04 DIAGNOSIS — I10 Essential (primary) hypertension: Secondary | ICD-10-CM | POA: Diagnosis not present

## 2023-06-05 ENCOUNTER — Ambulatory Visit (HOSPITAL_BASED_OUTPATIENT_CLINIC_OR_DEPARTMENT_OTHER): Payer: PPO

## 2023-06-05 DIAGNOSIS — I209 Angina pectoris, unspecified: Secondary | ICD-10-CM | POA: Diagnosis not present

## 2023-06-05 DIAGNOSIS — I700 Atherosclerosis of aorta: Secondary | ICD-10-CM | POA: Diagnosis not present

## 2023-06-05 DIAGNOSIS — E785 Hyperlipidemia, unspecified: Secondary | ICD-10-CM | POA: Diagnosis not present

## 2023-06-05 DIAGNOSIS — R011 Cardiac murmur, unspecified: Secondary | ICD-10-CM | POA: Diagnosis not present

## 2023-06-05 DIAGNOSIS — I25119 Atherosclerotic heart disease of native coronary artery with unspecified angina pectoris: Secondary | ICD-10-CM | POA: Diagnosis not present

## 2023-06-05 LAB — ECHOCARDIOGRAM COMPLETE
AR max vel: 2.03 cm2
AV Area VTI: 2 cm2
AV Area mean vel: 1.9 cm2
AV Mean grad: 8 mm[Hg]
AV Peak grad: 15.1 mm[Hg]
Ao pk vel: 1.94 m/s
Area-P 1/2: 2.47 cm2
S' Lateral: 2.45 cm

## 2023-06-08 ENCOUNTER — Ambulatory Visit: Payer: PPO | Admitting: Physician Assistant

## 2023-06-08 ENCOUNTER — Institutional Professional Consult (permissible substitution): Payer: PPO | Admitting: Thoracic Surgery (Cardiothoracic Vascular Surgery)

## 2023-06-08 ENCOUNTER — Other Ambulatory Visit: Payer: Self-pay | Admitting: *Deleted

## 2023-06-08 VITALS — BP 117/64 | HR 74 | Resp 18 | Ht 70.0 in | Wt 223.0 lb

## 2023-06-08 DIAGNOSIS — I2510 Atherosclerotic heart disease of native coronary artery without angina pectoris: Secondary | ICD-10-CM

## 2023-06-08 NOTE — Progress Notes (Signed)
 Isaac [name], Mr. Isaac [name] did not show for his appt with me today, he is scheduled to see you.

## 2023-06-08 NOTE — H&P (View-Only) (Signed)
 [address]       [place] [address]             [phone]        Isaac [name] [facility] Medical Record #[id number] Date of Birth: December 05, 1945  Referring: Jordan, Peter M, MD Primary Care: System, Provider Not In Primary Cardiologist:Mahesh DELENA Leavens, MD  Chief Complaint:    Chief Complaint  Patient presents with   Coronary Artery Disease    Cath 12/27, ECHO 12/31    History of Present Illness:     Isaac [name] 78 y.o. male presents for surgical evaluation of three-vessel coronary artery disease.  He states that he recently noticed some shortness of breath, grass, and working out.  Think this became worse in October which led to further workup.    Past Medical and Surgical History: Previous Chest Surgery: No Previous Chest Radiation: No Diabetes Mellitus: No.  HbA1C pending Creatinine:  Lab Results  Component Value Date   CREATININE 0.85 05/22/2023   CREATININE 0.88 04/27/2023   CREATININE 1.04 04/13/2017     Past Medical History:  Diagnosis Date   Abnormal fasting glucose    Acute right-sided low back pain with right-sided sciatica    Arthritis    R knee- DJD   BMI 35.0-35.9,adult    Complication of anesthesia    tachycardia with ST depression, HTN witth left TKA 04/2010 (neg cardiac w/u)   Difficult intubation    glidescope used 04/06/10   DJD (degenerative joint disease) of knee    Family history of bipolar disorder    Heart murmur    HLD (hyperlipidemia)    Overweight    Piriformis syndrome of right side     Past Surgical History:  Procedure Laterality Date   JOINT REPLACEMENT     L TKA   LEFT HEART CATH AND CORONARY ANGIOGRAPHY N/A 06/01/2023   Procedure: LEFT HEART CATH AND CORONARY ANGIOGRAPHY;  Surgeon: Jordan, Peter M, MD;  Location: [facility] INVASIVE CV LAB;  Service: Cardiovascular;  Laterality: N/A;   TOTAL KNEE ARTHROPLASTY Right 03/19/2013   Procedure: TOTAL KNEE ARTHROPLASTY;  Surgeon: Toribio JULIANNA Chancy,  MD;  Location: [facility] OR;  Service: Orthopedics;  Laterality: Right;   TOTAL KNEE ARTHROPLASTY Bilateral     Social History:  Social History   Tobacco Use  Smoking Status Former   Current packs/day: 0.00   Types: Cigarettes   Quit date: 03/10/1998   Years since quitting: 25.2  Smokeless Tobacco Never    Social History   Substance and Sexual Activity  Alcohol  Use Yes   Comment: 3 beers per week     No Known Allergies    Current Outpatient Medications  Medication Sig Dispense Refill   aspirin  EC 81 MG tablet Take 81 mg by mouth at bedtime. Swallow whole.     carvedilol  (COREG ) 6.25 MG tablet Take 1 tablet (6.25 mg total) by mouth 2 (two) times daily. 180 tablet 3   Ibuprofen-diphenhydrAMINE  HCl (ADVIL PM) 200-25 MG CAPS Take 2 tablets by mouth at bedtime.     isosorbide  mononitrate (IMDUR ) 30 MG 24 hr tablet Take 1 tablet (30 mg total) by mouth daily. 90 tablet 3   MAGNESIUM  PO Take 1 tablet by mouth at bedtime.     Multiple Vitamin (MULTI VITAMIN) TABS Take 1 tablet by mouth at bedtime.     nitroGLYCERIN  (NITROSTAT ) 0.4 MG SL tablet Place 1 tablet (0.4 mg total) under the tongue every 5 (  five) minutes as needed. May take up to 3 tablets in 15 min. 25 tablet 3   pravastatin  (PRAVACHOL ) 40 MG tablet Take 1 tablet (40 mg total) by mouth every evening. 90 tablet 3   TURMERIC PO Take 1 tablet by mouth at bedtime.     No current facility-administered medications for this visit.    (Not in a hospital admission)   Family History  Problem Relation Age of Onset   Other Mother        toxic septic shock colon   Cancer Father        breast   Bipolar disorder Father    Suicidality Brother    COPD Brother      Review of Systems:   Review of Systems  Constitutional:  Positive for malaise/fatigue. Negative for weight loss.  Respiratory:  Positive for shortness of breath.   Cardiovascular:  Positive for chest pain.  Neurological: Negative.       Physical Exam: BP 117/64    Pulse 74   Resp 18   Ht 5' 10 (1.778 m)   Wt 223 lb (101.2 kg)   SpO2 96% Comment: RA  BMI 32.00 kg/m  Physical Exam Constitutional:      General: He is not in acute distress.    Appearance: He is normal weight. He is not ill-appearing.  HENT:     Head: Normocephalic and atraumatic.  Eyes:     Extraocular Movements: Extraocular movements intact.  Cardiovascular:     Rate and Rhythm: Normal rate.  Pulmonary:     Effort: Pulmonary effort is normal.  Musculoskeletal:        General: Normal range of motion.     Cervical back: Normal range of motion.     Comments: Varicose veins L worst than right  Skin:    General: Skin is warm and dry.  Neurological:     General: No focal deficit present.     Mental Status: He is alert and oriented to person, place, and time.       Diagnostic Studies & Laboratory data:    Left Heart Catherization:  Intervention  Echo: IMPRESSIONS     1. Left ventricular ejection fraction, by estimation, is 60 to 65%. The  left ventricle has normal function. The left ventricle has no regional  wall motion abnormalities. Left ventricular diastolic parameters were  normal. GLS -22.8%.   2. Right ventricular systolic function is normal. The right ventricular  size is normal.   3. The mitral valve is normal in structure. No evidence of mitral valve  regurgitation. No evidence of mitral stenosis.   4. The aortic valve is normal in structure. There is moderate  calcification of the aortic valve. Aortic valve regurgitation is not  visualized. No aortic stenosis is present.   5. Aortic dilatation noted. There is mild dilatation of the ascending  aorta, measuring 39 mm.   6. The inferior vena cava is normal in size with greater than 50%  respiratory variability, suggesting right atrial pressure of 3 mmHg.     EKG: Sinus with AV block  I have independently reviewed the above radiologic studies and discussed with the patient   Recent Lab  Findings: Lab Results  Component Value Date   WBC 5.0 05/22/2023   HGB 14.3 05/22/2023   HCT 43.2 05/22/2023   PLT 253 05/22/2023   GLUCOSE 105 (H) 05/22/2023   CHOL  04/07/2010    140 (NOTE) ATP III Classification:      <  200        mg/dL        Desirable     799 - 239     mg/dL        Borderline High     >= 240        mg/dL        High    TRIG 80 04/07/2010   HDL 43 04/07/2010   LDLDIRECT 89 05/22/2023   LDLCALC  04/07/2010    81 (NOTE)  Total Cholesterol/HDL Ratio:CHD Risk                       Coronary Heart Disease Risk Table                                       Men       Women         1/2 Average Risk              3.4        3.3             Average Risk              5.0         4.4         2 X Average Risk              9.6        7.1         3 X Average Risk             23.4       11.0 Use the calculated Patient Ratio above and the CHD Risk table  to determine the patient's CHD Risk. ATP III Classification (LDL):      < 100         mg/dL         Optimal     899 - 129     mg/dL         Near or Above Optimal     130 - 159     mg/dL         Borderline High     160 - 189     mg/dL         High      > 809        mg/dL         Very High    ALT 22 04/13/2017   AST 24 04/13/2017   NA 139 05/22/2023   K 4.9 05/22/2023   CL 102 05/22/2023   CREATININE 0.85 05/22/2023   BUN 24 05/22/2023   CO2 21 05/22/2023   INR 1.00 03/10/2013      Assessment / Plan:   78 year old male with three-vessel coronary disease with stable angina.  His left heart catheterization shows preserved biventricular function, and no significant valvular disease.  We discussed the risks and benefits of surgical revascularization.  He is agreeable to proceed.  CABG 3    I  spent 40 minutes counseling the patient face to face.   Linnie MALVA Rayas 06/08/2023 11:56 AM

## 2023-06-08 NOTE — Progress Notes (Signed)
 [address]       [place] [address]             [phone]        Isaac [name] [facility] Medical Record #[id number] Date of Birth: December 05, 1945  Referring: Jordan, Peter M, Isaac [name] Primary Care: System, Provider Not In Primary Cardiologist:Mahesh DELENA Leavens, Isaac [name]  Chief Complaint:    Chief Complaint  Patient presents with   Coronary Artery Disease    Cath 12/27, ECHO 12/31    History of Present Illness:     Isaac [name] 78 y.o. male presents for surgical evaluation of three-vessel coronary artery disease.  He states that he recently noticed some shortness of breath, grass, and working out.  Think this became worse in October which led to further workup.    Past Medical and Surgical History: Previous Chest Surgery: No Previous Chest Radiation: No Diabetes Mellitus: No.  HbA1C pending Creatinine:  Lab Results  Component Value Date   CREATININE 0.85 05/22/2023   CREATININE 0.88 04/27/2023   CREATININE 1.04 04/13/2017     Past Medical History:  Diagnosis Date   Abnormal fasting glucose    Acute right-sided low back pain with right-sided sciatica    Arthritis    R knee- DJD   BMI 35.0-35.9,adult    Complication of anesthesia    tachycardia with ST depression, HTN witth left TKA 04/2010 (neg cardiac w/u)   Difficult intubation    glidescope used 04/06/10   DJD (degenerative joint disease) of knee    Family history of bipolar disorder    Heart murmur    HLD (hyperlipidemia)    Overweight    Piriformis syndrome of right side     Past Surgical History:  Procedure Laterality Date   JOINT REPLACEMENT     L TKA   LEFT HEART CATH AND CORONARY ANGIOGRAPHY N/A 06/01/2023   Procedure: LEFT HEART CATH AND CORONARY ANGIOGRAPHY;  Surgeon: Jordan, Peter M, Isaac [name];  Location: [facility] INVASIVE [facility];  Service: Cardiovascular;  Laterality: N/A;   TOTAL KNEE ARTHROPLASTY Right 03/19/2013   Procedure: TOTAL KNEE ARTHROPLASTY;  Surgeon: Isaac JULIANNA Chancy,  Isaac [name];  Location: [facility] OR;  Service: Orthopedics;  Laterality: Right;   TOTAL KNEE ARTHROPLASTY Bilateral     Social History:  Social History   Tobacco Use  Smoking Status Former   Current packs/day: 0.00   Types: Cigarettes   Quit date: 03/10/1998   Years since quitting: 25.2  Smokeless Tobacco Never    Social History   Substance and Sexual Activity  Alcohol  Use Yes   Comment: 3 beers per week     No Known Allergies    Current Outpatient Medications  Medication Sig Dispense Refill   aspirin  EC 81 MG tablet Take 81 mg by mouth at bedtime. Swallow whole.     carvedilol  (COREG ) 6.25 MG tablet Take 1 tablet (6.25 mg total) by mouth 2 (two) times daily. 180 tablet 3   Ibuprofen-diphenhydrAMINE  HCl (ADVIL PM) 200-25 MG CAPS Take 2 tablets by mouth at bedtime.     isosorbide  mononitrate (IMDUR ) 30 MG 24 hr tablet Take 1 tablet (30 mg total) by mouth daily. 90 tablet 3   MAGNESIUM  PO Take 1 tablet by mouth at bedtime.     Multiple Vitamin (MULTI VITAMIN) TABS Take 1 tablet by mouth at bedtime.     nitroGLYCERIN  (NITROSTAT ) 0.4 MG SL tablet Place 1 tablet (0.4 mg total) under the tongue every 5 (  five) minutes as needed. May take up to 3 tablets in 15 min. 25 tablet 3   pravastatin  (PRAVACHOL ) 40 MG tablet Take 1 tablet (40 mg total) by mouth every evening. 90 tablet 3   TURMERIC PO Take 1 tablet by mouth at bedtime.     No current facility-administered medications for this visit.    (Not in a hospital admission)   Family History  Problem Relation Age of Onset   Other Mother        toxic septic shock colon   Cancer Father        breast   Bipolar disorder Father    Suicidality Brother    COPD Brother      Review of Systems:   Review of Systems  Constitutional:  Positive for malaise/fatigue. Negative for weight loss.  Respiratory:  Positive for shortness of breath.   Cardiovascular:  Positive for chest pain.  Neurological: Negative.       Physical Exam: BP 117/64    Pulse 74   Resp 18   Ht 5' 10 (1.778 m)   Wt 223 lb (101.2 kg)   SpO2 96% Comment: RA  BMI 32.00 kg/m  Physical Exam Constitutional:      General: He is not in acute distress.    Appearance: He is normal weight. He is not ill-appearing.  HENT:     Head: Normocephalic and atraumatic.  Eyes:     Extraocular Movements: Extraocular movements intact.  Cardiovascular:     Rate and Rhythm: Normal rate.  Pulmonary:     Effort: Pulmonary effort is normal.  Musculoskeletal:        General: Normal range of motion.     Cervical back: Normal range of motion.     Comments: Varicose veins L worst than right  Skin:    General: Skin is warm and dry.  Neurological:     General: No focal deficit present.     Mental Status: He is alert and oriented to person, place, and time.       Diagnostic Studies & Laboratory data:    Left Heart Catherization:  Intervention  Echo: IMPRESSIONS     1. Left ventricular ejection fraction, by estimation, is 60 to 65%. The  left ventricle has normal function. The left ventricle has no regional  wall motion abnormalities. Left ventricular diastolic parameters were  normal. GLS -22.8%.   2. Right ventricular systolic function is normal. The right ventricular  size is normal.   3. The mitral valve is normal in structure. No evidence of mitral valve  regurgitation. No evidence of mitral stenosis.   4. The aortic valve is normal in structure. There is moderate  calcification of the aortic valve. Aortic valve regurgitation is not  visualized. No aortic stenosis is present.   5. Aortic dilatation noted. There is mild dilatation of the ascending  aorta, measuring 39 mm.   6. The inferior vena cava is normal in size with greater than 50%  respiratory variability, suggesting right atrial pressure of 3 mmHg.     EKG: Sinus with AV block  I have independently reviewed the above radiologic studies and discussed with the patient   Recent Lab  Findings: Lab Results  Component Value Date   WBC 5.0 05/22/2023   HGB 14.3 05/22/2023   HCT 43.2 05/22/2023   PLT 253 05/22/2023   GLUCOSE 105 (H) 05/22/2023   CHOL  04/07/2010    140 (NOTE) ATP III Classification:      <  200        mg/dL        Desirable     799 - 239     mg/dL        Borderline High     >= 240        mg/dL        High    TRIG 80 04/07/2010   HDL 43 04/07/2010   LDLDIRECT 89 05/22/2023   LDLCALC  04/07/2010    81 (NOTE)  Total Cholesterol/HDL Ratio:CHD Risk                       Coronary Heart Disease Risk Table                                       Men       Women         1/2 Average Risk              3.4        3.3             Average Risk              5.0         4.4         2 X Average Risk              9.6        7.1         3 X Average Risk             23.4       11.0 Use the calculated Patient Ratio above and the CHD Risk table  to determine the patient's CHD Risk. ATP III Classification (LDL):      < 100         mg/dL         Optimal     899 - 129     mg/dL         Near or Above Optimal     130 - 159     mg/dL         Borderline High     160 - 189     mg/dL         High      > 809        mg/dL         Very High    ALT 22 04/13/2017   AST 24 04/13/2017   NA 139 05/22/2023   K 4.9 05/22/2023   CL 102 05/22/2023   CREATININE 0.85 05/22/2023   BUN 24 05/22/2023   CO2 21 05/22/2023   INR 1.00 03/10/2013      Assessment / Plan:   78 year old male with three-vessel coronary disease with stable angina.  His left heart catheterization shows preserved biventricular function, and no significant valvular disease.  We discussed the risks and benefits of surgical revascularization.  He is agreeable to proceed.  CABG 3    I  spent 40 minutes counseling the patient face to face.   Linnie MALVA Rayas 06/08/2023 11:56 AM

## 2023-06-08 NOTE — Progress Notes (Signed)
 This encounter was created in error - please disregard.

## 2023-06-09 NOTE — Progress Notes (Deleted)
 Cardiology Office Note    Date:  06/09/2023  ID:  Isaac [name], DOB [birth year], MRN [id number] PCP:  System, Provider Not In  Cardiologist:  Isaac DELENA Leavens, MD  Electrophysiologist:  None   Chief Complaint: ***  History of Present Illness: .    Isaac [name] is a 78 y.o. male with visit-pertinent history of RBBB, Rare PVCs, aortic atherosclerosis and radiographic evidence of PAD.  First evaluated on 04/27/23 by Dr. Leavens for angina pectoris.  He reported chest discomfort and fatigue during physical exertion.  Symptoms began in late September while he was mowing the lawn.  He described a sensation of chest tightness which was Isaac [name] he attributed to aging.  However the frequency and intensity of his episodes increased over time.  His symptoms would improve with pausing of activity and deep breathing.  Coronary CTA on 05/21/2023 with CT FFR analysis showed evidence of a CTO of the RCA.  LHC on 06/01/2023 indicated severe two-vessel CAD with severe ostial LAD stenosis, the RCA with a CTO proximally that is well collateralized.  He was referred to CT surgery for potential CABG.   Echocardiogram on 06/05/2023 indicated LVEF of 60 to 65%, no RWMA, diastolic parameters were normal RV systolic function and size was normal.  There is moderate calcification of the aortic valve with no evidence of stenosis and no regurgitation visualized.  There was mild dilation of the ascending aorta measuring 39 mm.  Isaac [name] met with Dr. Shyrl on 06/08/2023 to discuss surgical revascularization, Isaac [name] was agreeable to proceed. Surgery has been tentativelly scheduled for 06/14/23.   Today Isaac [name] presents for follow up. He reports that he      Labwork independently reviewed: 05/22/2023: Sodium 139, potassium 4.9, creatinine 0.85  ROS: .   *** denies chest pain, shortness of breath, lower extremity edema, fatigue, palpitations, melena, hematuria, hemoptysis, diaphoresis, weakness,  presyncope, syncope, orthopnea, and PND.  All other systems are reviewed and otherwise negative.  Studies Reviewed: Isaac [name]    EKG:  EKG is ordered today, personally reviewed, demonstrating ***  CV Studies:  Cardiac Studies & Procedures   CARDIAC CATHETERIZATION  CARDIAC CATHETERIZATION 06/01/2023  Narrative   Dist LM to Ost LAD lesion is 90% stenosed.   Prox RCA lesion is 100% stenosed.   Ost LM to Ost LAD lesion is 35% stenosed with 35% stenosed side branch in Ost Cx.   The left ventricular systolic function is normal.   LV end diastolic pressure is normal.   The left ventricular ejection fraction is 55-65% by visual estimate.  Severe 2 vessel CAD. There is a severe ostial LAD stenosis. The RCA has a CTO proximally and is well collateralized Normal LV function Normal LVEDP  Plan: will refer to CT surgery for CABG  Findings Coronary Findings Diagnostic  Dominance: Right  Left Main Ost LM to Ost LAD lesion is 35% stenosed with 35% stenosed side branch in Ost Cx. Dist LM to Ost LAD lesion is 90% stenosed.  Right Coronary Artery Prox RCA lesion is 100% stenosed. The lesion is chronically occluded with left-to-right and right-to-right collateral flow.  Right Posterior Descending Artery Collaterals RPDA filled by collaterals from Dist LAD.  Intervention  No interventions have been documented.    ECHOCARDIOGRAM  ECHOCARDIOGRAM COMPLETE 06/05/2023  Narrative ECHOCARDIOGRAM REPORT    Patient Name:   Isaac [name] Date of Exam: 06/05/2023 Medical Rec #:  [id number]       Height:  70.0 in Accession #:    [id number]      Weight:       222.0 lb Date of Birth:  16-Mar-1946      BSA:          2.182 m Patient Age:    65 years        BP:           150/84 mmHg Patient Gender: M               HR:           66 bpm. Exam Location:  [address]  Procedure: 2D Echo, 3D Echo, Cardiac Doppler, Color Doppler and Strain Analysis  Indications:    R01.1 Murmur  History:         Patient has no prior history of Echocardiogram examinations. CAD, Abnormal ECG, PAD, Arrythmias:PVC, Signs/Symptoms:Chest Pain, Shortness of Breath and Murmur; Risk Factors:Dyslipidemia, Hypertension and Former Smoker. Pre-Operative Eval for CABG.  Sonographer:    Isaac [name] Referring Phys: Isaac [name]  IMPRESSIONS   1. Left ventricular ejection fraction, by estimation, is 60 to 65%. The left ventricle has normal function. The left ventricle has no regional wall motion abnormalities. Left ventricular diastolic parameters were normal. GLS -22.8%. 2. Right ventricular systolic function is normal. The right ventricular size is normal. 3. The mitral valve is normal in structure. No evidence of mitral valve regurgitation. No evidence of mitral stenosis. 4. The aortic valve is normal in structure. There is moderate calcification of the aortic valve. Aortic valve regurgitation is not visualized. No aortic stenosis is present. 5. Aortic dilatation noted. There is mild dilatation of the ascending aorta, measuring 39 mm. 6. The inferior vena cava is normal in size with greater than 50% respiratory variability, suggesting right atrial pressure of 3 mmHg.  FINDINGS Left Ventricle: Left ventricular ejection fraction, by estimation, is 60 to 65%. The left ventricle has normal function. The left ventricle has no regional wall motion abnormalities. The left ventricular internal cavity size was normal in size. There is no left ventricular hypertrophy. Left ventricular diastolic parameters were normal.  Right Ventricle: The right ventricular size is normal. No increase in right ventricular wall thickness. Right ventricular systolic function is normal.  Left Atrium: Left atrial size was normal in size.  Right Atrium: Right atrial size was normal in size.  Pericardium: There is no evidence of pericardial effusion.  Mitral Valve: The mitral valve is normal in structure. No evidence  of mitral valve regurgitation. No evidence of mitral valve stenosis.  Tricuspid Valve: The tricuspid valve is normal in structure. Tricuspid valve regurgitation is not demonstrated. No evidence of tricuspid stenosis.  Aortic Valve: The aortic valve is normal in structure. There is moderate calcification of the aortic valve. Aortic valve regurgitation is not visualized. No aortic stenosis is present. Aortic valve mean gradient measures 8.0 mmHg. Aortic valve peak gradient measures 15.1 mmHg. Aortic valve area, by VTI measures 2.00 cm.  Pulmonic Valve: The pulmonic valve was normal in structure. Pulmonic valve regurgitation is not visualized. No evidence of pulmonic stenosis.  Aorta: Aortic dilatation noted. There is mild dilatation of the ascending aorta, measuring 39 mm.  Venous: The inferior vena cava is normal in size with greater than 50% respiratory variability, suggesting right atrial pressure of 3 mmHg.  IAS/Shunts: No atrial level shunt detected by color flow Doppler.   LEFT VENTRICLE PLAX 2D LVIDd:         4.43 cm  Diastology LVIDs:         2.45 cm   LV e' medial:    6.74 cm/s LV PW:         0.77 cm   LV E/e' medial:  7.9 LV IVS:        0.87 cm   LV e' lateral:   11.20 cm/s LVOT diam:     2.30 cm   LV E/e' lateral: 4.7 LV SV:         84 LV SV Index:   39        2D Longitudinal Strain LVOT Area:     4.15 cm  2D Strain GLS (A2C):   -24.8 % 2D Strain GLS (A3C):   -24.5 % 2D Strain GLS (A4C):   -19.3 % 2D Strain GLS Avg:     -22.8 %  3D Volume EF: 3D EF:        70 % LV EDV:       139 ml LV ESV:       41 ml LV SV:        98 ml  RIGHT VENTRICLE RV Basal diam:  3.20 cm RV S prime:     18.10 cm/s TAPSE (M-mode): 3.0 cm  LEFT ATRIUM           Index        RIGHT ATRIUM           Index LA diam:      4.00 cm 1.83 cm/m   RA Pressure: 3.00 mmHg LA Vol (A2C): 76.1 ml 34.88 ml/m  RA Area:     18.40 cm LA Vol (A4C): 38.1 ml 17.46 ml/m  RA Volume:   52.60 ml  24.11  ml/m AORTIC VALVE AV Area (Vmax):    2.03 cm AV Area (Vmean):   1.90 cm AV Area (VTI):     2.00 cm AV Vmax:           194.00 cm/s AV Vmean:          133.500 cm/s AV VTI:            0.422 m AV Peak Grad:      15.1 mmHg AV Mean Grad:      8.0 mmHg LVOT Vmax:         95.00 cm/s LVOT Vmean:        61.000 cm/s LVOT VTI:          0.203 m LVOT/AV VTI ratio: 0.48  AORTA Ao Root diam: 3.60 cm Ao Asc diam:  3.90 cm  MITRAL VALVE               TRICUSPID VALVE MV Area (PHT): cm         Estimated RAP:  3.00 mmHg MV Decel Time: 308 msec MV E velocity: 53.10 cm/s  SHUNTS MV A velocity: 76.00 cm/s  Systemic VTI:  0.20 m MV E/A ratio:  0.70        Systemic Diam: 2.30 cm  Aditya Sabharwal Electronically signed by Ria Commander Signature Date/Time: 06/05/2023/6:49:22 PM    Final    CT SCANS  CT CORONARY MORPH W/CTA COR W/SCORE 05/09/2023  Addendum 06/02/2023 12:34 AM ADDENDUM REPORT: 06/02/2023 00:32  EXAM: OVER-READ INTERPRETATION  CT CHEST  The following report is an over-read performed by radiologist Dr. Oneil Devonshire of [facility] on 06/02/2023. This over-read does not include interpretation of cardiac or coronary anatomy or pathology. The coronary calcium  score/coronary CTA interpretation by the cardiologist  is attached.  COMPARISON:  None.  FINDINGS: Cardiovascular: There are no significant extracardiac vascular findings.  Mediastinum/Nodes: There are no enlarged lymph nodes within the visualized mediastinum.  Lungs/Pleura: There is no pleural effusion. The visualized lungs appear clear.  Upper abdomen: No significant findings in the visualized upper abdomen.  Musculoskeletal/Chest wall: No chest wall mass or suspicious osseous findings within the visualized chest.  IMPRESSION: No significant extracardiac findings within the visualized chest.   Electronically Signed By: Oneil Devonshire M.D. On: 06/02/2023 00:32  Narrative CLINICAL DATA:   78 Year-old Male  EXAM: Cardiac/Coronary  CTA  TECHNIQUE: The patient was scanned on a [employer].  FINDINGS: Scan was triggered in the descending thoracic aorta. Axial non-contrast 3 mm slices were carried out through the heart. The data set was analyzed on a dedicated work station and scored using the Agatson method. Gantry rotation speed was 250 msecs and collimation was .6 mm. 0.8 mg of sl NTG was given. The 3D data set was reconstructed in 5% intervals of the 67-82 % of the R-R cycle. Diastolic phases were analyzed on a dedicated work station using MPR, MIP and VRT modes. The patient received 95 cc of contrast.  Coronary Arteries:  Normal coronary origin.  Right dominance.  Coronary Calcium  Score:  Left main: 95  Left anterior descending artery: 458  Left circumflex artery: 10  Right coronary artery: 112  Total: 675  Percentile: 65th for age, sex, and race matched control.  RCA is a large dominant artery that gives rise to PDA and PLA. Mild non-obstructive mixed plaque (25-49%) in the ostial RCA. Severe 100% stenosis in the proximal RCA modeled as a CTO lesion with potential collateral flow.  Left main is a large artery that gives rise to LAD and LCX arteries. Mild non-obstructive soft plaque (25-49%) in the distal left main.  LAD is a large vessel that gives rise to two diagonal vessels. Mild non-obstructive mixed plaques (25-49%) in the proximal LAD. Mild non-obstructive mixed plaque (25-49%) in the mid LAD. Mild non-obstructive mixed plaque (25-49%) in the D1.  LCX is a non-dominant artery that gives rise to one large OM1 branch. Minimal non-obstructive calcified plaque (1-24%) in the proximal LCX. Minimal non-obstructive mixed plaque (1-24%) in the mid LCX.  Other non-coronary findings:  Aorta: Normal size.  Aortic atherosclerosis.  No dissection.  Main Pulmonary Artery: Normal size of the pulmonary artery.  Systemic Veins: Normal  drainage  Normal pulmonary vein drainage into the left atrium.  Normal left atrial appendage without a thrombus.  Interatrial septum with no clear communications.  Chamber dimensions: Normal dimensions.  Aortic Valve:  Tri-leaflet.  AV Calcium  score 266.  Mitral valve: No calcifications  Pericardium: Normal thickness  Extra-cardiac findings: See attached radiology report for non-cardiac structures.  Artifact: Slab artifact  Image quality: mild  IMPRESSION: 1. Coronary calcium  score of 675. This was 65th percentile for age, sex, and race matched control.  2. Normal coronary origin with Right dominance.  3. CAD-RADS 5 Total coronary occlusion (100%). Consider cardiac catheterization or viability assessment. CT-FFR will be sent. Consider symptom-guided anti-ischemic pharmacotherapy as well as risk factor modification per guideline directed care.  RECOMMENDATIONS: RECOMMENDATIONS The proposed cut-off value of 1,651 AU yielded a 93 % sensitivity and 75 % specificity in grading AS severity in patients with classical low-flow, low-gradient AS. Proposed different cut-off values to define severe AS for men and women as 2,065 AU and 1,274 AU, respectively. The joint European and American recommendations for  the assessment of AS consider the aortic valve calcium  score as a continuum - a very high calcium  score suggests severe AS and a low calcium  score suggests severe AS is unlikely.  Donney VEAR Jarome LULLA Stephen RENETTE, et al. 2017 ESC/EACTS Guidelines for the management of valvular heart disease. Eur Heart J 2017;38:2739-91.  Coronary artery calcium  (CAC) score is a strong predictor of incident coronary heart disease (CHD) and provides predictive information beyond traditional risk factors. CAC scoring is reasonable to use in the decision to withhold, postpone, or initiate statin therapy in intermediate-risk or selected borderline-risk asymptomatic adults (age 62-75 years and  LDL-C >=70 to <190 mg/dL) who do not have diabetes or established atherosclerotic cardiovascular disease (ASCVD).* In intermediate-risk (10-year ASCVD risk >=7.5% to <20%) adults or selected borderline-risk (10-year ASCVD risk >=5% to <7.5%) adults in whom a CAC score is measured for the purpose of making a treatment decision the following recommendations have been made:  If CAC = 0, it is reasonable to withhold statin therapy and reassess in 5 to 10 years, as long as higher risk conditions are absent (diabetes mellitus, family history of premature CHD in first degree relatives (males <55 years; females <65 years), cigarette smoking, LDL >=190 mg/dL or other independent risk factors).  If CAC is 1 to 99, it is reasonable to initiate statin therapy for patients >=110 years of age.  If CAC is >=100 or >=75th percentile, it is reasonable to initiate statin therapy at any age.  Cardiology referral should be considered for patients with CAC scores =400 or >=75th percentile.  *2018 AHA/ACC/AACVPR/AAPA/ABC/ACPM/ADA/AGS/APhA/ASPC/NLA/PCNA Guideline on the Management of Blood Cholesterol: A Report of the American College of Cardiology/American Heart Association Task Force on Clinical Practice Guidelines. J Am Coll Cardiol. 2019;73(24):3168-3209.  Isaac Leavens, MD  Electronically Signed: By: Isaac [name] M.D. On: 05/21/2023 12:28            Current Reported Medications:.    No outpatient medications have been marked as taking for the 06/12/23 encounter (Appointment) with Joffre Lucks D, NP.    Physical Exam:    VS:  There were no vitals taken for this visit.   Wt Readings from Last 3 Encounters:  06/08/23 223 lb (101.2 kg)  06/01/23 222 lb (100.7 kg)  05/22/23 222 lb (100.7 kg)    GEN: Well nourished, well developed in no acute distress NECK: No JVD; No carotid bruits CARDIAC: ***RRR, no murmurs, rubs, gallops RESPIRATORY:  Clear to auscultation without  rales, wheezing or rhonchi  ABDOMEN: Soft, non-tender, non-distended EXTREMITIES:  No edema; No acute deformity   Asessement and Plan:.     ***     Disposition: F/u with ***  Signed, Denyse Fillion D Lavera Vandermeer, NP

## 2023-06-12 ENCOUNTER — Ambulatory Visit: Payer: PPO | Admitting: Cardiology

## 2023-06-12 DIAGNOSIS — I700 Atherosclerosis of aorta: Secondary | ICD-10-CM

## 2023-06-12 DIAGNOSIS — I10 Essential (primary) hypertension: Secondary | ICD-10-CM

## 2023-06-12 DIAGNOSIS — I2510 Atherosclerotic heart disease of native coronary artery without angina pectoris: Secondary | ICD-10-CM

## 2023-06-12 DIAGNOSIS — E785 Hyperlipidemia, unspecified: Secondary | ICD-10-CM

## 2023-06-12 NOTE — Progress Notes (Signed)
 Surgical Instructions    Your procedure is scheduled on June 14, 2023.  Report to [facility] Main Entrance A at 6:00 A.M., then check in with the Admitting office.  Call this number if you have problems the morning of surgery:  [phone]  If you have any questions prior to your surgery date call [phone]: Open Monday-Friday 8am-4pm If you experience any cold or flu symptoms such as cough, fever, chills, shortness of breath, etc. between now and your scheduled surgery, please notify us  at the above number.     Remember:  Do not eat or drink after midnight the night before your surgery      Take these medicines the morning of surgery with A SIP OF WATER  isosorbide  mononitrate (IMDUR )  carvedilol  (COREG )  aspirin  EC   IF NEEDED nitroGLYCERIN  (NITROSTAT ) Please call [phone] if you need to use day before surgery.  As of today, STOP taking any Aspirin  (unless otherwise instructed by your surgeon) Aleve, Naproxen, Ibuprofen, Motrin, Advil, Goody's, BC's, all herbal medications, fish oil, and all vitamins.  THIS INCLUDES YOUR TURMERIC and   Ibuprofen-diphenhydrAMINE  HCl (ADVIL PM).              Do NOT Smoke (Tobacco/Vaping) for 24 hours prior to your procedure.  If you use a CPAP at night, you may bring your mask/headgear for your overnight stay.   Contacts, glasses, piercing's, hearing aid's, dentures or partials may not be worn into surgery, please bring cases for these belongings.    For patients admitted to the hospital, discharge time will be determined by your treatment team.   Patients discharged the day of surgery will not be allowed to drive home, and someone needs to stay with them for 24 hours.  SURGICAL WAITING ROOM VISITATION Patients having surgery or a procedure may have no more than 2 support people in the waiting area - these visitors may rotate.   Children under the age of 57 must have an adult with them who is not the patient. If the patient needs  to stay at the hospital during part of their recovery, the visitor guidelines for inpatient rooms apply. Pre-op nurse will coordinate an appropriate time for 1 support person to accompany patient in pre-op.  This support person may not rotate.   Please refer to the [facility] website for the visitor guidelines for Inpatients (after your surgery is over and you are in a regular room).    Special instructions:   [facility]- Preparing For Surgery  Before surgery, you can play an important role. Because skin is not sterile, your skin needs to be as free of germs as possible. You can reduce the number of germs on your skin by washing with CHG (chlorahexidine gluconate) Soap before surgery.  CHG is an antiseptic cleaner which kills germs and bonds with the skin to continue killing germs even after washing.    Oral Hygiene is also important to reduce your risk of infection.  Remember - BRUSH YOUR TEETH THE MORNING OF SURGERY WITH YOUR REGULAR TOOTHPASTE  Please do not use if you have an allergy  to CHG or antibacterial soaps. If your skin becomes reddened/irritated stop using the CHG.  Do not shave (including legs and underarms) for at least 48 hours prior to first CHG shower. It is OK to shave your face.  Please follow these instructions carefully.   Shower the NIGHT BEFORE SURGERY and the MORNING OF SURGERY  If you chose to wash your hair, wash your  hair first as usual with your normal shampoo.  After you shampoo, rinse your hair and body thoroughly to remove the shampoo.  Use CHG Soap as you would any other liquid soap. You can apply CHG directly to the skin and wash gently with a scrungie or a clean washcloth.   Apply the CHG Soap to your body ONLY FROM THE NECK DOWN.  Do not use on open wounds or open sores. Avoid contact with your eyes, ears, mouth and genitals (private parts). Wash Face and genitals (private parts)  with your normal soap.   Wash thoroughly, paying special attention to the  area where your surgery will be performed.  Thoroughly rinse your body with warm water from the neck down.  DO NOT shower/wash with your normal soap after using and rinsing off the CHG Soap.  Pat yourself dry with a CLEAN TOWEL.  Wear CLEAN PAJAMAS to bed the night before surgery  Place CLEAN SHEETS on your bed the night before your surgery  DO NOT SLEEP WITH PETS.   Day of Surgery: Take a shower with CHG soap. Do not wear jewelry or makeup Do not wear lotions, powders, perfumes/colognes, or deodorant. Do not shave 48 hours prior to surgery.  Men may shave face and neck. Do not bring valuables to the hospital.  [facility] is not responsible for any belongings or valuables. Do not wear nail polish, gel polish, artificial nails, or any other type of covering on natural nails (fingers and toes) If you have artificial nails or gel coating that need to be removed by a nail salon, please have this removed prior to surgery. Artificial nails or gel coating may interfere with anesthesia's ability to adequately monitor your vital signs. Wear Clean/Comfortable clothing the morning of surgery Remember to brush your teeth WITH YOUR REGULAR TOOTHPASTE.   Please read over the following fact sheets that you were given.    If you received a COVID test during your pre-op visit  it is requested that you wear a mask when out in public, stay away from anyone that may not be feeling well and notify your surgeon if you develop symptoms. If you have been in contact with anyone that has tested positive in the last 10 days please notify you surgeon.

## 2023-06-13 ENCOUNTER — Ambulatory Visit (HOSPITAL_COMMUNITY)
Admission: RE | Admit: 2023-06-13 | Discharge: 2023-06-13 | Disposition: A | Discharge status: Home / Self Care | Payer: PPO | Source: Ambulatory Visit | Attending: Thoracic Surgery (Cardiothoracic Vascular Surgery) | Admitting: Thoracic Surgery (Cardiothoracic Vascular Surgery)

## 2023-06-13 ENCOUNTER — Encounter (HOSPITAL_COMMUNITY)
Admission: RE | Admit: 2023-06-13 | Discharge: 2023-06-13 | Disposition: A | Discharge status: Home / Self Care | Payer: PPO | Source: Ambulatory Visit | Attending: Thoracic Surgery (Cardiothoracic Vascular Surgery) | Admitting: Thoracic Surgery (Cardiothoracic Vascular Surgery)

## 2023-06-13 ENCOUNTER — Encounter (HOSPITAL_COMMUNITY): Payer: Self-pay

## 2023-06-13 ENCOUNTER — Other Ambulatory Visit: Payer: Self-pay

## 2023-06-13 ENCOUNTER — Ambulatory Visit (HOSPITAL_BASED_OUTPATIENT_CLINIC_OR_DEPARTMENT_OTHER)
Admission: RE | Admit: 2023-06-13 | Discharge: 2023-06-13 | Disposition: A | Discharge status: Home / Self Care | Payer: PPO | Source: Ambulatory Visit | Attending: Thoracic Surgery (Cardiothoracic Vascular Surgery) | Admitting: Thoracic Surgery (Cardiothoracic Vascular Surgery)

## 2023-06-13 VITALS — BP 104/63 | HR 66 | Temp 97.8°F | Resp 18 | Ht 70.0 in | Wt 221.0 lb

## 2023-06-13 DIAGNOSIS — I2510 Atherosclerotic heart disease of native coronary artery without angina pectoris: Secondary | ICD-10-CM | POA: Insufficient documentation

## 2023-06-13 DIAGNOSIS — Z01818 Encounter for other preprocedural examination: Secondary | ICD-10-CM

## 2023-06-13 HISTORY — DX: Atherosclerotic heart disease of native coronary artery without angina pectoris: I25.10

## 2023-06-13 HISTORY — DX: Angina pectoris, unspecified: I20.9

## 2023-06-13 HISTORY — DX: Personal history of urinary calculi: Z87.442

## 2023-06-13 LAB — COMPREHENSIVE METABOLIC PANEL
ALT: 20 U/L (ref 0–44)
AST: 21 U/L (ref 15–41)
Albumin: 4.1 g/dL (ref 3.5–5.0)
Alkaline Phosphatase: 70 U/L (ref 38–126)
Anion gap: 7 (ref 5–15)
BUN: 19 mg/dL (ref 8–23)
CO2: 24 mmol/L (ref 22–32)
Calcium: 8.9 mg/dL (ref 8.9–10.3)
Chloride: 107 mmol/L (ref 98–111)
Creatinine, Ser: 0.83 mg/dL (ref 0.61–1.24)
GFR, Estimated: >60 mL/min (ref >60)
Glucose, Bld: 116 mg/dL — ABNORMAL HIGH (ref 70–99)
Potassium: 4.2 mmol/L (ref 3.5–5.1)
Sodium: 138 mmol/L (ref 135–145)
Total Bilirubin: 1.3 mg/dL — ABNORMAL HIGH (ref 0.0–1.2)
Total Protein: 7 g/dL (ref 6.5–8.1)

## 2023-06-13 LAB — URINALYSIS, ROUTINE W REFLEX MICROSCOPIC
Bilirubin Urine: NEGATIVE
Glucose, UA: NEGATIVE mg/dL
Hgb urine dipstick: NEGATIVE
Ketones, ur: NEGATIVE mg/dL
Leukocytes,Ua: NEGATIVE
Nitrite: NEGATIVE
Protein, ur: NEGATIVE mg/dL
Specific Gravity, Urine: 1.016 (ref 1.005–1.030)
pH: 5 (ref 5.0–8.0)

## 2023-06-13 LAB — CBC
HCT: 42.6 % (ref 39.0–52.0)
Hemoglobin: 14.2 g/dL (ref 13.0–17.0)
MCH: 29.8 pg (ref 26.0–34.0)
MCHC: 33.3 g/dL (ref 30.0–36.0)
MCV: 89.3 fL (ref 80.0–100.0)
Platelets: 261 10*3/uL (ref 150–400)
RBC: 4.77 MIL/uL (ref 4.22–5.81)
RDW: 13 % (ref 11.5–15.5)
WBC: 6.2 10*3/uL (ref 4.0–10.5)
nRBC: 0 % (ref 0.0–0.2)

## 2023-06-13 LAB — HEMOGLOBIN A1C
Hgb A1c MFr Bld: 5.5 % (ref 4.8–5.6)
Mean Plasma Glucose: 111.15 mg/dL

## 2023-06-13 LAB — APTT: aPTT: 27 s (ref 24–36)

## 2023-06-13 LAB — SURGICAL PCR SCREEN
MRSA, PCR: NEGATIVE
Staphylococcus aureus: NEGATIVE

## 2023-06-13 LAB — PROTIME-INR
INR: 1.1 (ref 0.8–1.2)
Prothrombin Time: 14.8 s (ref 11.4–15.2)

## 2023-06-13 LAB — TYPE AND SCREEN
ABO/RH(D): O POS
Antibody Screen: NEGATIVE

## 2023-06-13 MED ORDER — CEFAZOLIN SODIUM-DEXTROSE 2-4 GM/100ML-% IV SOLN
2.0000 g | INTRAVENOUS | Status: DC
  Filled 2023-06-13: qty 100

## 2023-06-13 MED ORDER — CEFAZOLIN SODIUM-DEXTROSE 2-4 GM/100ML-% IV SOLN
2.0000 g | INTRAVENOUS | Status: AC
  Administered 2023-06-14: 2 g via INTRAVENOUS
  Filled 2023-06-13: qty 100

## 2023-06-13 MED ORDER — TRANEXAMIC ACID (OHS) PUMP PRIME SOLUTION
2.0000 mg/kg | INTRAVENOUS | Status: DC
  Filled 2023-06-13: qty 2.02

## 2023-06-13 MED ORDER — DEXMEDETOMIDINE HCL IN NACL 400 MCG/100ML IV SOLN
0.1000 ug/kg/h | INTRAVENOUS | Status: AC
  Administered 2023-06-14: .5 ug/kg/h via INTRAVENOUS
  Filled 2023-06-13: qty 100

## 2023-06-13 MED ORDER — TRANEXAMIC ACID (OHS) BOLUS VIA INFUSION
15.0000 mg/kg | INTRAVENOUS | Status: AC
  Administered 2023-06-14: 1518 mg via INTRAVENOUS
  Filled 2023-06-13: qty 1518

## 2023-06-13 MED ORDER — NOREPINEPHRINE 4 MG/250ML-% IV SOLN
0.0000 ug/min | INTRAVENOUS | Status: AC
  Administered 2023-06-14: 2 ug/min via INTRAVENOUS
  Filled 2023-06-13: qty 250

## 2023-06-13 MED ORDER — TRANEXAMIC ACID 1000 MG/10ML IV SOLN
1.5000 mg/kg/h | INTRAVENOUS | Status: AC
  Administered 2023-06-14: 1.5 mg/kg/h via INTRAVENOUS
  Filled 2023-06-13: qty 25

## 2023-06-13 MED ORDER — INSULIN REGULAR(HUMAN) IN NACL 100-0.9 UT/100ML-% IV SOLN
INTRAVENOUS | Status: AC
  Administered 2023-06-14: 1 [IU]/h via INTRAVENOUS
  Filled 2023-06-13: qty 100

## 2023-06-13 MED ORDER — HEPARIN 30,000 UNITS/1000 ML (OHS) CELLSAVER SOLUTION
Status: DC
  Filled 2023-06-13: qty 1000

## 2023-06-13 MED ORDER — VANCOMYCIN HCL 1.5 G IV SOLR
1500.0000 mg | INTRAVENOUS | Status: AC
  Administered 2023-06-14: 1500 mg via INTRAVENOUS
  Filled 2023-06-13: qty 30

## 2023-06-13 MED ORDER — POTASSIUM CHLORIDE 2 MEQ/ML IV SOLN
80.0000 meq | INTRAVENOUS | Status: DC
  Filled 2023-06-13: qty 40

## 2023-06-13 MED ORDER — NITROGLYCERIN IN D5W 200-5 MCG/ML-% IV SOLN
2.0000 ug/min | INTRAVENOUS | Status: DC
  Filled 2023-06-13: qty 250

## 2023-06-13 MED ORDER — EPINEPHRINE HCL 5 MG/250ML IV SOLN IN NS
0.0000 ug/min | INTRAVENOUS | Status: DC
  Filled 2023-06-13: qty 250

## 2023-06-13 MED ORDER — PHENYLEPHRINE HCL-NACL 20-0.9 MG/250ML-% IV SOLN
30.0000 ug/min | INTRAVENOUS | Status: AC
  Administered 2023-06-14: 10 ug/min via INTRAVENOUS
  Filled 2023-06-13: qty 250

## 2023-06-13 MED ORDER — MILRINONE LACTATE IN DEXTROSE 20-5 MG/100ML-% IV SOLN
0.3000 ug/kg/min | INTRAVENOUS | Status: DC
  Filled 2023-06-13: qty 100

## 2023-06-13 MED ORDER — MANNITOL 20 % IV SOLN
INTRAVENOUS | Status: DC
  Filled 2023-06-13: qty 13

## 2023-06-13 MED ORDER — PLASMA-LYTE A IV SOLN
INTRAVENOUS | Status: DC
  Filled 2023-06-13: qty 2.5

## 2023-06-13 NOTE — Progress Notes (Signed)
 PCP - Dr. [employer] Cardiologist - Dr. Stanly Leavens  PPM/ICD - denies   Chest x-ray - 06/13/23 EKG - 05/22/23 Stress Test - 05/31/10 ECHO - 06/05/23 Cardiac Cath - 06/01/23  Sleep Study - denies   DM- denies  Last dose of GLP1 agonist-  n/a   Blood Thinner Instructions: n/a Aspirin  Instructions: Hold ASA DOS  ERAS Protcol - no, NPO   COVID TEST- n/a   Anesthesia review: yes, cardiac hx  Patient denies shortness of breath, fever, cough and chest pain at PAT appointment   All instructions explained to the patient, with a verbal understanding of the material. Patient agrees to go over the instructions while at home for a better understanding. The opportunity to ask questions was provided.

## 2023-06-13 NOTE — Discharge Instructions (Signed)

## 2023-06-13 NOTE — Anesthesia Preprocedure Evaluation (Signed)
 Anesthesia Evaluation    Airway        Dental   Pulmonary former smoker          Cardiovascular      Neuro/Psych    GI/Hepatic   Endo/Other    Renal/GU      Musculoskeletal   Abdominal   Peds  Hematology   Anesthesia Other Findings   Reproductive/Obstetrics                             Anesthesia Physical Anesthesia Plan  ASA:   Anesthesia Plan:    Post-op Pain Management:    Induction:   PONV Risk Score and Plan:   Airway Management Planned:   Additional Equipment:   Intra-op Plan:   Post-operative Plan:   Informed Consent:   Plan Discussed with:   Anesthesia Plan Comments: (PAT note written 06/13/2023 by Keyron Pokorski, PA-C.  )       Anesthesia Quick Evaluation

## 2023-06-13 NOTE — Progress Notes (Addendum)
 Anesthesia Chart Review:  DISCUSSION: Patient is a 78 year old male scheduled for CABG on 06/14/2023 by Shyrl , MD.  History includes former smoker (quit 03/11/95), DIFFICULT INTUBATION (04/06/10, Glidescope used), CAD, murmur, HLD, PAD, nephrolithiasis, abnormal fasting glucose, osteoarthritis (left TKA 04/06/10; right TKA 03/19/13)  Per my previous chart review prior to 03/19/13 TKA, Anesthesia records obtained from 04/06/10 which indicated that there was poor visualization with a Miller 2 and glidescope was ultimately used to place a 7.5 ETT. On 03/19/13: Placement: Stylet  ETT Size (mm): 7.5  Number of Attempts at Approach: 1 (Dr. Warnell performs DL and intubation, good view of epiglottis and cricoid pressure given posterior arytenoids identified and tube placed)     06/13/23 UA and CXR results are still in process. A1c  5.5%. Anesthesia team to evaluate on the day of surgery.   VS: BP 104/63   Pulse 66   Temp 36.6 C (Oral)   Resp 18   Ht 5' 10 (1.778 m)   Wt 100.2 kg   SpO2 100%   BMI 31.71 kg/m   PROVIDERS: System, Provider Not In Santo Kelly, MD is cardiologist   LABS: Labs reviewed: Acceptable for surgery. UA and PCR are in prcess.  (all labs ordered are listed, but only abnormal results are displayed)  Labs Reviewed  COMPREHENSIVE METABOLIC PANEL - Abnormal; Notable for the following components:      Result Value   Glucose, Bld 116 (*)    Total Bilirubin 1.3 (*)    All other components within normal limits  SURGICAL PCR SCREEN  CBC  PROTIME-INR  APTT  HEMOGLOBIN A1C  URINALYSIS, ROUTINE W REFLEX MICROSCOPIC  TYPE AND SCREEN    IMAGES: CXR 06/13/23: In process.   CT Chest (over read CCTA) 05/09/23: FINDINGS: - Cardiovascular: There are no significant extracardiac vascular findings. - Mediastinum/Nodes: There are no enlarged lymph nodes within the visualized mediastinum. - Lungs/Pleura: There is no pleural effusion. The visualized lungs  appear clear. - Upper abdomen: No significant findings in the visualized upper abdomen. - Musculoskeletal/Chest wall: No chest wall mass or suspicious osseousfindings within the visualized chest.   EKG: 05/22/23:  Sinus rhythm with 1st degree A-V block Right bundle branch block When compared with ECG of 27-Apr-2023 15:44, T wave inversion no longer evident in Inferior leads Confirmed by Santo Kelly ([id number]) on 05/22/2023 10:08:01 AM   CV: US  Carotid 06/13/23 (PRELIMINARY): Summary:  - Right Carotid: Velocities in the right ICA are consistent with a 1-39% stenosis. Non-hemodynamically significant plaque <50% noted in the CCA.  - Left Carotid: Velocities in the left ICA are consistent with a 1-39% stenosis. Non-hemodynamically significant plaque <50% noted in the CCA.  - Vertebrals: Bilateral vertebral arteries demonstrate antegrade flow.  - Subclavians: Normal flow hemodynamics were seen in bilateral subclavian arteries.    Echo 06/05/23: IMPRESSIONS   1. Left ventricular ejection fraction, by estimation, is 60 to 65%. The  left ventricle has normal function. The left ventricle has no regional  wall motion abnormalities. Left ventricular diastolic parameters were  normal. GLS -22.8%.   2. Right ventricular systolic function is normal. The right ventricular  size is normal.   3. The mitral valve is normal in structure. No evidence of mitral valve  regurgitation. No evidence of mitral stenosis.   4. The aortic valve is normal in structure. There is moderate  calcification of the aortic valve. Aortic valve regurgitation is not  visualized. No aortic stenosis is present.   5. Aortic dilatation noted. There  is mild dilatation of the ascending  aorta, measuring 39 mm.   6. The inferior vena cava is normal in size with greater than 50%  respiratory variability, suggesting right atrial pressure of 3 mmHg.    LHC 06/01/23:   Dist LM to Ost LAD lesion is 90% stenosed.   Prox  RCA lesion is 100% stenosed.   Ost LM to Ost LAD lesion is 35% stenosed with 35% stenosed side branch in Ost Cx.   The left ventricular systolic function is normal.   LV end diastolic pressure is normal.   The left ventricular ejection fraction is 55-65% by visual estimate.   Severe 2 vessel CAD. There is a severe ostial LAD stenosis. The RCA has a CTO proximally and is well collateralized Normal LV function Normal LVEDP   Plan: will refer to CT surgery for CABG  Past Medical History:  Diagnosis Date   Abnormal fasting glucose    Acute right-sided low back pain with right-sided sciatica    Anginal pain (HCC)    Arthritis    R knee- DJD   BMI 35.0-35.9,adult    Complication of anesthesia    tachycardia with ST depression, HTN witth left TKA 04/2010 (neg cardiac w/u)   Coronary artery disease    Difficult intubation    glidescope used 04/06/10   DJD (degenerative joint disease) of knee    Family history of bipolar disorder    Heart murmur    History of kidney stones    HLD (hyperlipidemia)    Overweight    Piriformis syndrome of right side     Past Surgical History:  Procedure Laterality Date   JOINT REPLACEMENT  2010   L TKA   LEFT HEART CATH AND CORONARY ANGIOGRAPHY N/A 06/01/2023   Procedure: LEFT HEART CATH AND CORONARY ANGIOGRAPHY;  Surgeon: Jordan, Peter M, MD;  Location: [facility] INVASIVE CV LAB;  Service: Cardiovascular;  Laterality: N/A;   TOTAL KNEE ARTHROPLASTY Right 03/19/2013   Procedure: TOTAL KNEE ARTHROPLASTY;  Surgeon: Toribio JULIANNA Chancy, MD;  Location: [facility] OR;  Service: Orthopedics;  Laterality: Right;   TOTAL KNEE ARTHROPLASTY Bilateral     MEDICATIONS:  aspirin  EC 81 MG tablet   carvedilol  (COREG ) 6.25 MG tablet   Ibuprofen-diphenhydrAMINE  HCl (ADVIL PM) 200-25 MG CAPS   isosorbide  mononitrate (IMDUR ) 30 MG 24 hr tablet   MAGNESIUM  PO   Multiple Vitamin (MULTI VITAMIN) TABS   nitroGLYCERIN  (NITROSTAT ) 0.4 MG SL tablet   pravastatin  (PRAVACHOL ) 40 MG tablet    TURMERIC PO   No current facility-administered medications for this encounter.    [START ON 06/14/2023] ceFAZolin  (ANCEF ) IVPB 2g/100 mL premix   [START ON 06/14/2023] ceFAZolin  (ANCEF ) IVPB 2g/100 mL premix   [START ON 06/14/2023] dexmedetomidine  (PRECEDEX ) 400 MCG/100ML (4 mcg/mL) infusion   [START ON 06/14/2023] EPINEPHrine  (ADRENALIN ) 5 mg in NS 250 mL (0.02 mg/mL) premix infusion   [START ON 06/14/2023] heparin  30,000 units/NS 1000 mL solution for CELLSAVER   [START ON 06/14/2023] heparin  sodium (porcine) 2,500 Units, papaverine  30 mg in electrolyte-A (PLASMALYTE-A PH 7.4) 500 mL irrigation   [START ON 06/14/2023] insulin  regular, human (MYXREDLIN ) 100 units/ 100 mL infusion   [START ON 06/14/2023] [place] Blood Cardioplegia vial (lidocaine /magnesium /mannitol  0.26g-4g-6.4g)   [START ON 06/14/2023] milrinone  (PRIMACOR ) 20 MG/100 ML (0.2 mg/mL) infusion   [START ON 06/14/2023] nitroGLYCERIN  50 mg in dextrose  5 % 250 mL (0.2 mg/mL) infusion   [START ON 06/14/2023] norepinephrine  (LEVOPHED ) 4mg  in (0.016 mg/mL) premix infusion   [START  ON 06/14/2023] phenylephrine  (NEO-SYNEPHRINE) 20mg /NS 250mL premix infusion   [START ON 06/14/2023] potassium chloride  injection 80 mEq   [START ON 06/14/2023] tranexamic acid  (CYKLOKAPRON ) 2,500 mg in sodium chloride  0.9 % 250 mL (10 mg/mL) infusion   [START ON 06/14/2023] tranexamic acid  (CYKLOKAPRON ) bolus via infusion - over 30 minutes 1,518 mg   [START ON 06/14/2023] tranexamic acid  (CYKLOKAPRON ) pump prime solution 202 mg   [START ON 06/14/2023] Vancomycin  (VANCOCIN ) 1,500 mg in sodium chloride  0.9 % 500 mL IVPB    Isaiah Ruder, PA-C Surgical Short Stay/Anesthesiology [facility] Phone [phone] [facility] Phone [phone] 06/13/2023 4:15 PM

## 2023-06-13 NOTE — Anesthesia Preprocedure Evaluation (Addendum)
 Anesthesia Evaluation  Patient identified by MRN, date of birth, ID band Patient awake    Reviewed: Allergy  & Precautions, H&P , NPO status , Patient's Chart, lab work & pertinent test results  History of Anesthesia Complications (+) DIFFICULT AIRWAY and history of anesthetic complications  Airway Mallampati: III  TM Distance: >3 FB Neck ROM: Full    Dental no notable dental hx. (+) Teeth Intact, Dental Advisory Given   Pulmonary neg pulmonary ROS, former smoker   Pulmonary exam normal breath sounds clear to auscultation       Cardiovascular Exercise Tolerance: Good hypertension, Pt. on medications and Pt. on home beta blockers + angina  + CAD   Rhythm:Regular Rate:Normal     Neuro/Psych negative neurological ROS  negative psych ROS   GI/Hepatic negative GI ROS, Neg liver ROS,,,  Endo/Other  negative endocrine ROS    Renal/GU negative Renal ROS  negative genitourinary   Musculoskeletal  (+) Arthritis , Osteoarthritis,    Abdominal   Peds  Hematology negative hematology ROS (+)   Anesthesia Other Findings   Reproductive/Obstetrics negative OB ROS                             Anesthesia Physical Anesthesia Plan  ASA: 4  Anesthesia Plan: General   Post-op Pain Management: Tylenol  PO (pre-op)*   Induction: Intravenous  PONV Risk Score and Plan: 2 and Ondansetron , Dexamethasone  and Midazolam   Airway Management Planned: Oral ETT and Video Laryngoscope Planned  Additional Equipment: Arterial line, CVP, TEE and Ultrasound Guidance Line Placement  Intra-op Plan:   Post-operative Plan: Post-operative intubation/ventilation  Informed Consent: I have reviewed the patients History and Physical, chart, labs and discussed the procedure including the risks, benefits and alternatives for the proposed anesthesia with the patient or authorized representative who has indicated his/her  understanding and acceptance.     Dental advisory given  Plan Discussed with: CRNA  Anesthesia Plan Comments:        Anesthesia Quick Evaluation

## 2023-06-13 NOTE — Hospital Course (Addendum)
 HPI: This is a 78 year old male with a past medical history of hyperlipidemia, obesity, and remote tobacco abuse who was seen in the office by Dr. Shyrl for the evaluation of coronary artery disease. Patient underwent a cardiac catheterization on 06/01/2023 and was found to have a LVEF 55-65% and severe 2 vessel coronary artery disease. Dr. Shyrl discussed the need for coronary artery bypass grafting surgery. Potential risks, benefits, and complications of the surgery were discussed with the patient and he agreed to proceed with surgery. Pre operative carotid duplex US  showed no significant internal carotid artery stenosis bilaterally.   Hospital Course: Patient underwent a CABG x 1 (LIMA to LAD) off pump. He was transported from the OR to [facility] ICU in stable condition. He was extubated 01/09 without complication. His drips were weaned as hemodynamics tolerated. Arterial line was removed without complication.  On postop day #2 pacing wire,  chest tubes and central line were removed.  He was started on a course of diuresis for volume overload expected for heart surgery and not congestive heart failure.  He did develop some hallucinations and narcotics were discontinued.  He was started on Toradol  for pain. He was transitioned off the Insulin  drip. His pre op HGA1C was 5.5. Accu checks and SS PRN will be stopped after transfer to the floor. He was started on Amlodipine . He was stable for transfer from the ICU to 4E for further convalescence on 01/13. He has been maintaining tachycardic on Lopressor  so this has been titrated for better control to 50 mg bid on 01/15.SABRA He has been ambulating on room air with good oxygenation. He has been tolerating a diet and has had a bowel movement. All wounds are clean, dry, healing without signs of infection. Because BP was somewhat labile, Amlodipine  was stopped on 01/14 so that he can continue to receive his Lopressor . He is felt stable for discharge today.

## 2023-06-14 ENCOUNTER — Other Ambulatory Visit: Payer: Self-pay

## 2023-06-14 ENCOUNTER — Inpatient Hospital Stay (HOSPITAL_COMMUNITY): Payer: PPO | Admitting: Anesthesiology

## 2023-06-14 ENCOUNTER — Inpatient Hospital Stay (HOSPITAL_COMMUNITY): Payer: PPO

## 2023-06-14 ENCOUNTER — Inpatient Hospital Stay (HOSPITAL_COMMUNITY)
Admission: RE | Disposition: A | Discharge status: Home / Self Care | Payer: Self-pay | Source: Home / Self Care | Attending: Thoracic Surgery (Cardiothoracic Vascular Surgery)

## 2023-06-14 ENCOUNTER — Inpatient Hospital Stay (HOSPITAL_COMMUNITY)
Admission: RE | Admit: 2023-06-14 | Discharge: 2023-06-20 | DRG: 236 | Disposition: A | Discharge status: Home Health | Payer: PPO | Attending: Thoracic Surgery (Cardiothoracic Vascular Surgery) | Admitting: Thoracic Surgery (Cardiothoracic Vascular Surgery)

## 2023-06-14 ENCOUNTER — Encounter (HOSPITAL_COMMUNITY): Payer: Self-pay | Admitting: Thoracic Surgery (Cardiothoracic Vascular Surgery)

## 2023-06-14 DIAGNOSIS — I25119 Atherosclerotic heart disease of native coronary artery with unspecified angina pectoris: Secondary | ICD-10-CM | POA: Diagnosis not present

## 2023-06-14 DIAGNOSIS — Z818 Family history of other mental and behavioral disorders: Secondary | ICD-10-CM

## 2023-06-14 DIAGNOSIS — R443 Hallucinations, unspecified: Secondary | ICD-10-CM | POA: Diagnosis not present

## 2023-06-14 DIAGNOSIS — D62 Acute posthemorrhagic anemia: Secondary | ICD-10-CM | POA: Diagnosis not present

## 2023-06-14 DIAGNOSIS — Z87891 Personal history of nicotine dependence: Secondary | ICD-10-CM

## 2023-06-14 DIAGNOSIS — E66811 Obesity, class 1: Secondary | ICD-10-CM | POA: Diagnosis present

## 2023-06-14 DIAGNOSIS — E785 Hyperlipidemia, unspecified: Secondary | ICD-10-CM | POA: Diagnosis not present

## 2023-06-14 DIAGNOSIS — D65 Disseminated intravascular coagulation [defibrination syndrome]: Secondary | ICD-10-CM | POA: Diagnosis not present

## 2023-06-14 DIAGNOSIS — Z9911 Dependence on respirator [ventilator] status: Secondary | ICD-10-CM

## 2023-06-14 DIAGNOSIS — I2510 Atherosclerotic heart disease of native coronary artery without angina pectoris: Secondary | ICD-10-CM | POA: Diagnosis not present

## 2023-06-14 DIAGNOSIS — E8770 Fluid overload, unspecified: Secondary | ICD-10-CM | POA: Diagnosis not present

## 2023-06-14 DIAGNOSIS — Z803 Family history of malignant neoplasm of breast: Secondary | ICD-10-CM

## 2023-06-14 DIAGNOSIS — Z79899 Other long term (current) drug therapy: Secondary | ICD-10-CM

## 2023-06-14 DIAGNOSIS — Z96653 Presence of artificial knee joint, bilateral: Secondary | ICD-10-CM | POA: Diagnosis present

## 2023-06-14 DIAGNOSIS — E669 Obesity, unspecified: Secondary | ICD-10-CM | POA: Diagnosis present

## 2023-06-14 DIAGNOSIS — Z951 Presence of aortocoronary bypass graft: Secondary | ICD-10-CM

## 2023-06-14 DIAGNOSIS — M5441 Lumbago with sciatica, right side: Secondary | ICD-10-CM | POA: Diagnosis not present

## 2023-06-14 DIAGNOSIS — I4510 Unspecified right bundle-branch block: Secondary | ICD-10-CM | POA: Diagnosis present

## 2023-06-14 DIAGNOSIS — Z6831 Body mass index (BMI) 31.0-31.9, adult: Secondary | ICD-10-CM | POA: Diagnosis not present

## 2023-06-14 DIAGNOSIS — T40605A Adverse effect of unspecified narcotics, initial encounter: Secondary | ICD-10-CM | POA: Diagnosis not present

## 2023-06-14 DIAGNOSIS — E7251 Non-ketotic hyperglycinemia: Secondary | ICD-10-CM | POA: Diagnosis not present

## 2023-06-14 DIAGNOSIS — Z87442 Personal history of urinary calculi: Secondary | ICD-10-CM | POA: Diagnosis not present

## 2023-06-14 DIAGNOSIS — Z825 Family history of asthma and other chronic lower respiratory diseases: Secondary | ICD-10-CM | POA: Diagnosis not present

## 2023-06-14 DIAGNOSIS — R011 Cardiac murmur, unspecified: Secondary | ICD-10-CM | POA: Diagnosis present

## 2023-06-14 DIAGNOSIS — I10 Essential (primary) hypertension: Secondary | ICD-10-CM

## 2023-06-14 HISTORY — PX: CORONARY ARTERY BYPASS GRAFT: SHX141

## 2023-06-14 HISTORY — PX: TEE WITHOUT CARDIOVERSION: SHX5443

## 2023-06-14 LAB — CBC
HCT: 33.7 % — ABNORMAL LOW (ref 39.0–52.0)
HCT: 35.2 % — ABNORMAL LOW (ref 39.0–52.0)
Hemoglobin: 11.4 g/dL — ABNORMAL LOW (ref 13.0–17.0)
Hemoglobin: 11.9 g/dL — ABNORMAL LOW (ref 13.0–17.0)
MCH: 30.1 pg (ref 26.0–34.0)
MCH: 30.1 pg (ref 26.0–34.0)
MCHC: 33.8 g/dL (ref 30.0–36.0)
MCHC: 33.8 g/dL (ref 30.0–36.0)
MCV: 88.9 fL (ref 80.0–100.0)
MCV: 88.9 fL (ref 80.0–100.0)
Platelets: 185 10*3/uL (ref 150–400)
Platelets: 215 10*3/uL (ref 150–400)
RBC: 3.79 MIL/uL — ABNORMAL LOW (ref 4.22–5.81)
RBC: 3.96 MIL/uL — ABNORMAL LOW (ref 4.22–5.81)
RDW: 13.1 % (ref 11.5–15.5)
RDW: 12.9 % (ref 11.5–15.5)
WBC: 10.1 10*3/uL (ref 4.0–10.5)
WBC: 9.6 10*3/uL (ref 4.0–10.5)
nRBC: 0 % (ref 0.0–0.2)
nRBC: 0 % (ref 0.0–0.2)

## 2023-06-14 LAB — POCT I-STAT, CHEM 8
BUN: 21 mg/dL (ref 8–23)
BUN: 20 mg/dL (ref 8–23)
BUN: 21 mg/dL (ref 8–23)
BUN: 21 mg/dL (ref 8–23)
Calcium, Ion: 1.22 mmol/L (ref 1.15–1.40)
Calcium, Ion: 1.14 mmol/L — ABNORMAL LOW (ref 1.15–1.40)
Calcium, Ion: 1.12 mmol/L — ABNORMAL LOW (ref 1.15–1.40)
Calcium, Ion: 1.19 mmol/L (ref 1.15–1.40)
Chloride: 105 mmol/L (ref 98–111)
Chloride: 110 mmol/L (ref 98–111)
Chloride: 106 mmol/L (ref 98–111)
Chloride: 108 mmol/L (ref 98–111)
Creatinine, Ser: 0.8 mg/dL (ref 0.61–1.24)
Creatinine, Ser: 0.7 mg/dL (ref 0.61–1.24)
Creatinine, Ser: 0.7 mg/dL (ref 0.61–1.24)
Creatinine, Ser: 0.7 mg/dL (ref 0.61–1.24)
Glucose, Bld: 111 mg/dL — ABNORMAL HIGH (ref 70–99)
Glucose, Bld: 141 mg/dL — ABNORMAL HIGH (ref 70–99)
Glucose, Bld: 144 mg/dL — ABNORMAL HIGH (ref 70–99)
Glucose, Bld: 148 mg/dL — ABNORMAL HIGH (ref 70–99)
HCT: 33 % — ABNORMAL LOW (ref 39.0–52.0)
HCT: 32 % — ABNORMAL LOW (ref 39.0–52.0)
HCT: 30 % — ABNORMAL LOW (ref 39.0–52.0)
HCT: 33 % — ABNORMAL LOW (ref 39.0–52.0)
Hemoglobin: 11.2 g/dL — ABNORMAL LOW (ref 13.0–17.0)
Hemoglobin: 10.9 g/dL — ABNORMAL LOW (ref 13.0–17.0)
Hemoglobin: 10.2 g/dL — ABNORMAL LOW (ref 13.0–17.0)
Hemoglobin: 11.2 g/dL — ABNORMAL LOW (ref 13.0–17.0)
Potassium: 3.9 mmol/L (ref 3.5–5.1)
Potassium: 3.9 mmol/L (ref 3.5–5.1)
Potassium: 4 mmol/L (ref 3.5–5.1)
Potassium: 4.1 mmol/L (ref 3.5–5.1)
Sodium: 139 mmol/L (ref 135–145)
Sodium: 140 mmol/L (ref 135–145)
Sodium: 139 mmol/L (ref 135–145)
Sodium: 140 mmol/L (ref 135–145)
TCO2: 23 mmol/L (ref 22–32)
TCO2: 21 mmol/L — ABNORMAL LOW (ref 22–32)
TCO2: 22 mmol/L (ref 22–32)
TCO2: 22 mmol/L (ref 22–32)

## 2023-06-14 LAB — POCT I-STAT 7, (LYTES, BLD GAS, ICA,H+H)
Acid-base deficit: 4 mmol/L — ABNORMAL HIGH (ref 0.0–2.0)
Acid-base deficit: 6 mmol/L — ABNORMAL HIGH (ref 0.0–2.0)
Acid-base deficit: 4 mmol/L — ABNORMAL HIGH (ref 0.0–2.0)
Acid-base deficit: 6 mmol/L — ABNORMAL HIGH (ref 0.0–2.0)
Acid-base deficit: 6 mmol/L — ABNORMAL HIGH (ref 0.0–2.0)
Acid-base deficit: 4 mmol/L — ABNORMAL HIGH (ref 0.0–2.0)
Bicarbonate: 22.2 mmol/L (ref 20.0–28.0)
Bicarbonate: 20.6 mmol/L (ref 20.0–28.0)
Bicarbonate: 19.5 mmol/L — ABNORMAL LOW (ref 20.0–28.0)
Bicarbonate: 21.4 mmol/L (ref 20.0–28.0)
Bicarbonate: 19.1 mmol/L — ABNORMAL LOW (ref 20.0–28.0)
Bicarbonate: 21.7 mmol/L (ref 20.0–28.0)
Calcium, Ion: 1.22 mmol/L (ref 1.15–1.40)
Calcium, Ion: 1.18 mmol/L (ref 1.15–1.40)
Calcium, Ion: 1.12 mmol/L — ABNORMAL LOW (ref 1.15–1.40)
Calcium, Ion: 1.2 mmol/L (ref 1.15–1.40)
Calcium, Ion: 1.15 mmol/L (ref 1.15–1.40)
Calcium, Ion: 1.23 mmol/L (ref 1.15–1.40)
HCT: 34 % — ABNORMAL LOW (ref 39.0–52.0)
HCT: 33 % — ABNORMAL LOW (ref 39.0–52.0)
HCT: 29 % — ABNORMAL LOW (ref 39.0–52.0)
HCT: 33 % — ABNORMAL LOW (ref 39.0–52.0)
HCT: 31 % — ABNORMAL LOW (ref 39.0–52.0)
HCT: 34 % — ABNORMAL LOW (ref 39.0–52.0)
Hemoglobin: 11.6 g/dL — ABNORMAL LOW (ref 13.0–17.0)
Hemoglobin: 11.2 g/dL — ABNORMAL LOW (ref 13.0–17.0)
Hemoglobin: 11.2 g/dL — ABNORMAL LOW (ref 13.0–17.0)
Hemoglobin: 9.9 g/dL — ABNORMAL LOW (ref 13.0–17.0)
Hemoglobin: 10.5 g/dL — ABNORMAL LOW (ref 13.0–17.0)
Hemoglobin: 11.6 g/dL — ABNORMAL LOW (ref 13.0–17.0)
O2 Saturation: 100 %
O2 Saturation: 99 %
O2 Saturation: 97 %
O2 Saturation: 99 %
O2 Saturation: 95 %
O2 Saturation: 95 %
Patient temperature: 33.8
Patient temperature: 36.2
Patient temperature: 37
Potassium: 3.9 mmol/L (ref 3.5–5.1)
Potassium: 4.1 mmol/L (ref 3.5–5.1)
Potassium: 3.8 mmol/L (ref 3.5–5.1)
Potassium: 4 mmol/L (ref 3.5–5.1)
Potassium: 3.7 mmol/L (ref 3.5–5.1)
Potassium: 3.9 mmol/L (ref 3.5–5.1)
Sodium: 139 mmol/L (ref 135–145)
Sodium: 140 mmol/L (ref 135–145)
Sodium: 138 mmol/L (ref 135–145)
Sodium: 140 mmol/L (ref 135–145)
Sodium: 138 mmol/L (ref 135–145)
Sodium: 137 mmol/L (ref 135–145)
TCO2: 24 mmol/L (ref 22–32)
TCO2: 22 mmol/L (ref 22–32)
TCO2: 21 mmol/L — ABNORMAL LOW (ref 22–32)
TCO2: 23 mmol/L (ref 22–32)
TCO2: 20 mmol/L — ABNORMAL LOW (ref 22–32)
TCO2: 23 mmol/L (ref 22–32)
pCO2 arterial: 44.4 mm[Hg] (ref 32–48)
pCO2 arterial: 42.5 mm[Hg] (ref 32–48)
pCO2 arterial: 34.5 mm[Hg] (ref 32–48)
pCO2 arterial: 39.2 mm[Hg] (ref 32–48)
pCO2 arterial: 32.4 mm[Hg] (ref 32–48)
pCO2 arterial: 41.9 mm[Hg] (ref 32–48)
pH, Arterial: 7.308 — ABNORMAL LOW (ref 7.35–7.45)
pH, Arterial: 7.293 — ABNORMAL LOW (ref 7.35–7.45)
pH, Arterial: 7.303 — ABNORMAL LOW (ref 7.35–7.45)
pH, Arterial: 7.385 (ref 7.35–7.45)
pH, Arterial: 7.375 (ref 7.35–7.45)
pH, Arterial: 7.321 — ABNORMAL LOW (ref 7.35–7.45)
pO2, Arterial: 264 mm[Hg] — ABNORMAL HIGH (ref 83–108)
pO2, Arterial: 141 mm[Hg] — ABNORMAL HIGH (ref 83–108)
pO2, Arterial: 171 mm[Hg] — ABNORMAL HIGH (ref 83–108)
pO2, Arterial: 83 mm[Hg] (ref 83–108)
pO2, Arterial: 75 mm[Hg] — ABNORMAL LOW (ref 83–108)
pO2, Arterial: 81 mm[Hg] — ABNORMAL LOW (ref 83–108)

## 2023-06-14 LAB — GLUCOSE, CAPILLARY
Glucose-Capillary: 118 mg/dL — ABNORMAL HIGH (ref 70–99)
Glucose-Capillary: 126 mg/dL — ABNORMAL HIGH (ref 70–99)
Glucose-Capillary: 130 mg/dL — ABNORMAL HIGH (ref 70–99)
Glucose-Capillary: 130 mg/dL — ABNORMAL HIGH (ref 70–99)
Glucose-Capillary: 132 mg/dL — ABNORMAL HIGH (ref 70–99)
Glucose-Capillary: 134 mg/dL — ABNORMAL HIGH (ref 70–99)
Glucose-Capillary: 139 mg/dL — ABNORMAL HIGH (ref 70–99)
Glucose-Capillary: 143 mg/dL — ABNORMAL HIGH (ref 70–99)
Glucose-Capillary: 144 mg/dL — ABNORMAL HIGH (ref 70–99)
Glucose-Capillary: 145 mg/dL — ABNORMAL HIGH (ref 70–99)
Glucose-Capillary: 146 mg/dL — ABNORMAL HIGH (ref 70–99)
Glucose-Capillary: 167 mg/dL — ABNORMAL HIGH (ref 70–99)

## 2023-06-14 LAB — BASIC METABOLIC PANEL
Anion gap: 8 (ref 5–15)
BUN: 19 mg/dL (ref 8–23)
CO2: 20 mmol/L — ABNORMAL LOW (ref 22–32)
Calcium: 8 mg/dL — ABNORMAL LOW (ref 8.9–10.3)
Chloride: 107 mmol/L (ref 98–111)
Creatinine, Ser: 0.67 mg/dL (ref 0.61–1.24)
GFR, Estimated: >60 mL/min (ref >60)
Glucose, Bld: 132 mg/dL — ABNORMAL HIGH (ref 70–99)
Potassium: 3.8 mmol/L (ref 3.5–5.1)
Sodium: 135 mmol/L (ref 135–145)

## 2023-06-14 LAB — ECHO INTRAOPERATIVE TEE
AV Mean grad: 7 mm[Hg]
AV Peak grad: 13.5 mm[Hg]
Ao pk vel: 1.84 m/s
Height: 70 in
Weight: 3536 [oz_av]

## 2023-06-14 LAB — PROTIME-INR
INR: 1.4 — ABNORMAL HIGH (ref 0.8–1.2)
Prothrombin Time: 17.1 s — ABNORMAL HIGH (ref 11.4–15.2)

## 2023-06-14 LAB — APTT: aPTT: 30 s (ref 24–36)

## 2023-06-14 SURGERY — OFF PUMP CORONARY ARTERY BYPASS GRAFTING (CABG)
Anesthesia: General | Site: Chest

## 2023-06-14 MED ORDER — FENTANYL CITRATE (PF) 250 MCG/5ML IJ SOLN
INTRAMUSCULAR | Status: AC
  Filled 2023-06-14: qty 5

## 2023-06-14 MED ORDER — ONDANSETRON HCL 4 MG/2ML IJ SOLN: 4.0000 mg | Freq: Four times a day (QID) | INTRAMUSCULAR | Status: DC | PRN

## 2023-06-14 MED ORDER — CHLORHEXIDINE GLUCONATE 0.12 % MT SOLN
15.0000 mL | Freq: Once | OROMUCOSAL | Status: AC
Start: 2023-06-15 — End: 2023-06-14
  Administered 2023-06-14: 15 mL via OROMUCOSAL
  Filled 2023-06-14: qty 15

## 2023-06-14 MED ORDER — PHENYLEPHRINE 80 MCG/ML (10ML) SYRINGE FOR IV PUSH (FOR BLOOD PRESSURE SUPPORT)
PREFILLED_SYRINGE | INTRAVENOUS | Status: AC
  Filled 2023-06-14: qty 10

## 2023-06-14 MED ORDER — DOCUSATE SODIUM 100 MG PO CAPS
200.0000 mg | ORAL_CAPSULE | Freq: Every day | ORAL | Status: DC
  Administered 2023-06-15 – 2023-06-20 (×6): 200 mg via ORAL
  Filled 2023-06-14 (×6): qty 2

## 2023-06-14 MED ORDER — MORPHINE SULFATE (PF) 2 MG/ML IV SOLN
1.0000 mg | INTRAVENOUS | Status: DC | PRN
Start: 2023-06-14 — End: 2023-06-19
  Administered 2023-06-14 (×2): 4 mg via INTRAVENOUS
  Administered 2023-06-14 (×3): 2 mg via INTRAVENOUS
  Administered 2023-06-14 – 2023-06-15 (×3): 4 mg via INTRAVENOUS
  Filled 2023-06-14 (×2): qty 2
  Filled 2023-06-14: qty 1
  Filled 2023-06-14: qty 2
  Filled 2023-06-14: qty 1
  Filled 2023-06-14 (×3): qty 2

## 2023-06-14 MED ORDER — PANTOPRAZOLE SODIUM 40 MG PO TBEC
40.0000 mg | DELAYED_RELEASE_TABLET | Freq: Every day | ORAL | Status: DC
  Administered 2023-06-16 – 2023-06-17 (×2): 40 mg via ORAL
  Filled 2023-06-14 (×2): qty 1

## 2023-06-14 MED ORDER — SODIUM CHLORIDE 0.9% FLUSH: 3.0000 mL | INTRAVENOUS | Status: DC | PRN

## 2023-06-14 MED ORDER — BISACODYL 5 MG PO TBEC
10.0000 mg | DELAYED_RELEASE_TABLET | Freq: Every day | ORAL | Status: DC
  Administered 2023-06-15 – 2023-06-19 (×5): 10 mg via ORAL
  Filled 2023-06-14 (×5): qty 2

## 2023-06-14 MED ORDER — MIDAZOLAM HCL 5 MG/5ML IJ SOLN: INTRAMUSCULAR | Status: DC | PRN

## 2023-06-14 MED ORDER — ADULT MULTIVITAMIN W/MINERALS CH
1.0000 | ORAL_TABLET | Freq: Every day | ORAL | Status: DC
  Filled 2023-06-14: qty 1

## 2023-06-14 MED ORDER — DOBUTAMINE-DEXTROSE 4-5 MG/ML-% IV SOLN
2.5000 ug/kg/min | INTRAVENOUS | Status: DC
Start: 2023-06-14 — End: 2023-06-15

## 2023-06-14 MED ORDER — ROCURONIUM BROMIDE 10 MG/ML (PF) SYRINGE
PREFILLED_SYRINGE | INTRAVENOUS | Status: AC
  Filled 2023-06-14: qty 10

## 2023-06-14 MED ORDER — PROPOFOL 10 MG/ML IV BOLUS
INTRAVENOUS | Status: AC
  Filled 2023-06-14: qty 20

## 2023-06-14 MED ORDER — ~~LOC~~ CARDIAC SURGERY, PATIENT & FAMILY EDUCATION
Freq: Once | Status: DC
  Filled 2023-06-14: qty 1

## 2023-06-14 MED ORDER — PHENYLEPHRINE 80 MCG/ML (10ML) SYRINGE FOR IV PUSH (FOR BLOOD PRESSURE SUPPORT)
PREFILLED_SYRINGE | INTRAVENOUS | Status: DC | PRN
  Administered 2023-06-14: 80 ug via INTRAVENOUS

## 2023-06-14 MED ORDER — HEPARIN SODIUM (PORCINE) 1000 UNIT/ML IJ SOLN
INTRAMUSCULAR | Status: AC
  Filled 2023-06-14: qty 1

## 2023-06-14 MED ORDER — PROTAMINE SULFATE 10 MG/ML IV SOLN
INTRAVENOUS | Status: DC | PRN
  Administered 2023-06-14 (×2): 30 mg via INTRAVENOUS
  Administered 2023-06-14: 40 mg via INTRAVENOUS
  Administered 2023-06-14: 30 mg via INTRAVENOUS
  Administered 2023-06-14: 20 mg via INTRAVENOUS

## 2023-06-14 MED ORDER — POTASSIUM CHLORIDE 10 MEQ/50ML IV SOLN: 10.0000 meq | INTRAVENOUS | Status: AC

## 2023-06-14 MED ORDER — LACTATED RINGERS IV SOLN: INTRAVENOUS | Status: AC

## 2023-06-14 MED ORDER — LACTATED RINGERS IV SOLN: INTRAVENOUS | Status: DC | PRN

## 2023-06-14 MED ORDER — CEFAZOLIN SODIUM-DEXTROSE 2-4 GM/100ML-% IV SOLN
2.0000 g | Freq: Three times a day (TID) | INTRAVENOUS | Status: AC
  Administered 2023-06-14 – 2023-06-16 (×6): 2 g via INTRAVENOUS
  Filled 2023-06-14 (×6): qty 100

## 2023-06-14 MED ORDER — BISACODYL 10 MG RE SUPP: 10.0000 mg | Freq: Every day | RECTAL | Status: DC

## 2023-06-14 MED ORDER — INSULIN REGULAR(HUMAN) IN NACL 100-0.9 UT/100ML-% IV SOLN: INTRAVENOUS | Status: DC

## 2023-06-14 MED ORDER — CHLORHEXIDINE GLUCONATE CLOTH 2 % EX PADS
6.0000 | MEDICATED_PAD | Freq: Every day | CUTANEOUS | Status: DC
  Administered 2023-06-14 – 2023-06-20 (×7): 6 via TOPICAL

## 2023-06-14 MED ORDER — MIDAZOLAM HCL 2 MG/2ML IJ SOLN
2.0000 mg | INTRAMUSCULAR | Status: DC | PRN
  Administered 2023-06-14: 2 mg via INTRAVENOUS
  Filled 2023-06-14: qty 2

## 2023-06-14 MED ORDER — METOPROLOL TARTRATE 12.5 MG HALF TABLET
12.5000 mg | ORAL_TABLET | Freq: Once | ORAL | Status: DC
  Filled 2023-06-14: qty 1

## 2023-06-14 MED ORDER — SODIUM CHLORIDE 0.45 % IV SOLN: INTRAVENOUS | Status: AC | PRN

## 2023-06-14 MED ORDER — ALBUMIN HUMAN 5 % IV SOLN: INTRAVENOUS | Status: DC | PRN

## 2023-06-14 MED ORDER — EPHEDRINE SULFATE-NACL 50-0.9 MG/10ML-% IV SOSY
PREFILLED_SYRINGE | INTRAVENOUS | Status: DC | PRN
  Administered 2023-06-14: 5 mg via INTRAVENOUS

## 2023-06-14 MED ORDER — SODIUM CHLORIDE 0.9% FLUSH
3.0000 mL | Freq: Two times a day (BID) | INTRAVENOUS | Status: DC
  Administered 2023-06-15 – 2023-06-17 (×6): 3 mL via INTRAVENOUS

## 2023-06-14 MED ORDER — HEPARIN SODIUM (PORCINE) 1000 UNIT/ML IJ SOLN
INTRAMUSCULAR | Status: DC | PRN
  Administered 2023-06-14: 15000 [IU] via INTRAVENOUS
  Administered 2023-06-14: 3000 [IU] via INTRAVENOUS

## 2023-06-14 MED ORDER — MIDAZOLAM HCL (PF) 10 MG/2ML IJ SOLN
INTRAMUSCULAR | Status: AC
  Filled 2023-06-14: qty 2

## 2023-06-14 MED ORDER — VANCOMYCIN HCL IN DEXTROSE 1-5 GM/200ML-% IV SOLN
1000.0000 mg | Freq: Once | INTRAVENOUS | Status: AC
  Administered 2023-06-14: 1000 mg via INTRAVENOUS
  Filled 2023-06-14: qty 200

## 2023-06-14 MED ORDER — ROCURONIUM BROMIDE 10 MG/ML (PF) SYRINGE
PREFILLED_SYRINGE | INTRAVENOUS | Status: DC | PRN
  Administered 2023-06-14: 100 mg via INTRAVENOUS
  Administered 2023-06-14 (×2): 50 mg via INTRAVENOUS

## 2023-06-14 MED ORDER — NITROGLYCERIN IN D5W 200-5 MCG/ML-% IV SOLN
0.0000 ug/min | INTRAVENOUS | Status: DC
  Administered 2023-06-14: 5 ug/min via INTRAVENOUS

## 2023-06-14 MED ORDER — CHLORHEXIDINE GLUCONATE 0.12 % MT SOLN
15.0000 mL | OROMUCOSAL | Status: AC
  Administered 2023-06-14: 15 mL via OROMUCOSAL
  Filled 2023-06-14: qty 15

## 2023-06-14 MED ORDER — ACETAMINOPHEN 500 MG PO TABS
1000.0000 mg | ORAL_TABLET | Freq: Four times a day (QID) | ORAL | Status: AC
  Administered 2023-06-14 – 2023-06-19 (×20): 1000 mg via ORAL
  Filled 2023-06-14 (×19): qty 2

## 2023-06-14 MED ORDER — DEXMEDETOMIDINE HCL IN NACL 400 MCG/100ML IV SOLN: 0.0000 ug/kg/h | INTRAVENOUS | Status: DC

## 2023-06-14 MED ORDER — ALBUMIN HUMAN 5 % IV SOLN
250.0000 mL | INTRAVENOUS | Status: DC | PRN
  Administered 2023-06-14 (×2): 12.5 g via INTRAVENOUS
  Filled 2023-06-14: qty 250

## 2023-06-14 MED ORDER — NOREPINEPHRINE 4 MG/250ML-% IV SOLN: 0.0000 ug/min | INTRAVENOUS | Status: DC

## 2023-06-14 MED ORDER — OXYCODONE HCL 5 MG PO TABS
5.0000 mg | ORAL_TABLET | ORAL | Status: DC | PRN
Start: 2023-06-14 — End: 2023-06-16
  Administered 2023-06-14 – 2023-06-15 (×5): 10 mg via ORAL
  Filled 2023-06-14 (×5): qty 2

## 2023-06-14 MED ORDER — CLEVIDIPINE BUTYRATE 0.5 MG/ML IV EMUL
0.0000 mg/h | INTRAVENOUS | Status: DC
  Administered 2023-06-14: 2 mg/h via INTRAVENOUS
  Administered 2023-06-15: 8 mg/h via INTRAVENOUS
  Filled 2023-06-14 (×2): qty 100

## 2023-06-14 MED ORDER — EPHEDRINE 5 MG/ML INJ
INTRAVENOUS | Status: AC
  Filled 2023-06-14: qty 5

## 2023-06-14 MED ORDER — SUCCINYLCHOLINE CHLORIDE 200 MG/10ML IV SOSY
PREFILLED_SYRINGE | INTRAVENOUS | Status: AC
  Filled 2023-06-14: qty 10

## 2023-06-14 MED ORDER — SODIUM CHLORIDE (PF) 0.9 % IJ SOLN
INTRAMUSCULAR | Status: AC
  Filled 2023-06-14: qty 10

## 2023-06-14 MED ORDER — SODIUM CHLORIDE 0.9 % IV SOLN: 250.0000 mL | INTRAVENOUS | Status: AC

## 2023-06-14 MED ORDER — SODIUM CHLORIDE (PF) 0.9 % IJ SOLN: OROMUCOSAL | Status: DC | PRN

## 2023-06-14 MED ORDER — METOPROLOL TARTRATE 12.5 MG HALF TABLET
12.5000 mg | ORAL_TABLET | Freq: Two times a day (BID) | ORAL | Status: DC
  Administered 2023-06-14: 12.5 mg via ORAL
  Filled 2023-06-14: qty 1

## 2023-06-14 MED ORDER — KETOROLAC TROMETHAMINE 15 MG/ML IJ SOLN
15.0000 mg | Freq: Once | INTRAMUSCULAR | Status: AC
  Administered 2023-06-14: 15 mg via INTRAVENOUS
  Filled 2023-06-14: qty 1

## 2023-06-14 MED ORDER — SODIUM CHLORIDE 0.9 % IV SOLN: INTRAVENOUS | Status: AC

## 2023-06-14 MED ORDER — ACETAMINOPHEN 325 MG PO TABS
650.0000 mg | ORAL_TABLET | Freq: Once | ORAL | Status: AC
  Administered 2023-06-14: 650 mg via ORAL
  Filled 2023-06-14: qty 2

## 2023-06-14 MED ORDER — 0.9 % SODIUM CHLORIDE (POUR BTL) OPTIME
TOPICAL | Status: DC | PRN
  Administered 2023-06-14: 5000 mL

## 2023-06-14 MED ORDER — ACETAMINOPHEN 160 MG/5ML PO SOLN
650.0000 mg | Freq: Once | ORAL | Status: DC
Start: 2023-06-14 — End: 2023-06-14

## 2023-06-14 MED ORDER — ACETAMINOPHEN 500 MG PO TABS
1000.0000 mg | ORAL_TABLET | Freq: Once | ORAL | Status: AC
  Administered 2023-06-14: 1000 mg via ORAL
  Filled 2023-06-14: qty 2

## 2023-06-14 MED ORDER — ACETAMINOPHEN 160 MG/5ML PO SOLN: 1000.0000 mg | Freq: Four times a day (QID) | ORAL | Status: AC

## 2023-06-14 MED ORDER — TRAMADOL HCL 50 MG PO TABS
50.0000 mg | ORAL_TABLET | ORAL | Status: DC | PRN
  Administered 2023-06-14 – 2023-06-15 (×4): 100 mg via ORAL
  Filled 2023-06-14 (×4): qty 2

## 2023-06-14 MED ORDER — PANTOPRAZOLE SODIUM 40 MG IV SOLR
40.0000 mg | Freq: Every day | INTRAVENOUS | Status: AC
  Administered 2023-06-14 – 2023-06-15 (×2): 40 mg via INTRAVENOUS
  Filled 2023-06-14 (×2): qty 10

## 2023-06-14 MED ORDER — ASPIRIN 325 MG PO TBEC
325.0000 mg | DELAYED_RELEASE_TABLET | Freq: Every day | ORAL | Status: DC
  Administered 2023-06-15 – 2023-06-20 (×6): 325 mg via ORAL
  Filled 2023-06-14 (×6): qty 1

## 2023-06-14 MED ORDER — PROPOFOL 10 MG/ML IV BOLUS
INTRAVENOUS | Status: DC | PRN
  Administered 2023-06-14: 50 mg via INTRAVENOUS

## 2023-06-14 MED ORDER — METOPROLOL TARTRATE 5 MG/5ML IV SOLN
2.5000 mg | INTRAVENOUS | Status: DC | PRN
  Administered 2023-06-14 – 2023-06-16 (×3): 5 mg via INTRAVENOUS
  Filled 2023-06-14 (×4): qty 5

## 2023-06-14 MED ORDER — ONDANSETRON HCL 4 MG/2ML IJ SOLN
INTRAMUSCULAR | Status: DC | PRN
  Administered 2023-06-14: 4 mg via INTRAVENOUS

## 2023-06-14 MED ORDER — FENTANYL CITRATE (PF) 250 MCG/5ML IJ SOLN
INTRAMUSCULAR | Status: DC | PRN
  Administered 2023-06-14: 50 ug via INTRAVENOUS
  Administered 2023-06-14 (×2): 100 ug via INTRAVENOUS
  Administered 2023-06-14: 50 ug via INTRAVENOUS
  Administered 2023-06-14: 100 ug via INTRAVENOUS
  Administered 2023-06-14: 50 ug via INTRAVENOUS
  Administered 2023-06-14: 100 ug via INTRAVENOUS
  Administered 2023-06-14: 150 ug via INTRAVENOUS
  Administered 2023-06-14: 100 ug via INTRAVENOUS

## 2023-06-14 MED ORDER — CHLORHEXIDINE GLUCONATE 4 % EX SOLN: 30.0000 mL | CUTANEOUS | Status: DC

## 2023-06-14 MED ORDER — PRAVASTATIN SODIUM 40 MG PO TABS: 40.0000 mg | ORAL_TABLET | Freq: Every day | ORAL | Status: DC

## 2023-06-14 MED ORDER — DEXTROSE 50 % IV SOLN: 0.0000 mL | INTRAVENOUS | Status: DC | PRN

## 2023-06-14 MED ORDER — MAGNESIUM SULFATE 4 GM/100ML IV SOLN
4.0000 g | Freq: Once | INTRAVENOUS | Status: AC
  Administered 2023-06-14: 4 g via INTRAVENOUS
  Filled 2023-06-14: qty 100

## 2023-06-14 MED ORDER — PLASMA-LYTE A IV SOLN
INTRAVENOUS | Status: DC | PRN
  Administered 2023-06-14: 500 mL via INTRAVASCULAR

## 2023-06-14 MED ORDER — DEXAMETHASONE SODIUM PHOSPHATE 10 MG/ML IJ SOLN
INTRAMUSCULAR | Status: DC | PRN
  Administered 2023-06-14: 10 mg via INTRAVENOUS

## 2023-06-14 MED ORDER — MIDAZOLAM HCL 5 MG/5ML IJ SOLN
INTRAMUSCULAR | Status: DC | PRN
  Administered 2023-06-14 (×2): 1 mg via INTRAVENOUS
  Administered 2023-06-14: 3 mg via INTRAVENOUS

## 2023-06-14 MED ORDER — ASPIRIN 81 MG PO CHEW
324.0000 mg | CHEWABLE_TABLET | Freq: Every day | ORAL | Status: DC
  Filled 2023-06-14: qty 4

## 2023-06-14 MED ORDER — METOCLOPRAMIDE HCL 5 MG/ML IJ SOLN
10.0000 mg | Freq: Four times a day (QID) | INTRAMUSCULAR | Status: AC
  Administered 2023-06-14 – 2023-06-15 (×6): 10 mg via INTRAVENOUS
  Filled 2023-06-14 (×6): qty 2

## 2023-06-14 MED ORDER — ASPIRIN 81 MG PO CHEW
324.0000 mg | CHEWABLE_TABLET | Freq: Once | ORAL | Status: AC
  Administered 2023-06-14: 324 mg via ORAL
  Filled 2023-06-14: qty 4

## 2023-06-14 MED ORDER — METOPROLOL TARTRATE 25 MG/10 ML ORAL SUSPENSION: 12.5000 mg | Freq: Two times a day (BID) | ORAL | Status: DC

## 2023-06-14 SURGICAL SUPPLY — 83 items
BAG DECANTER FOR FLEXI CONT (MISCELLANEOUS) ×2 IMPLANT
BLADE CLIPPER SURG (BLADE) ×2 IMPLANT
BLADE STERNUM SYSTEM 6 (BLADE) ×2 IMPLANT
BLADE SURG 11 STRL SS (BLADE) IMPLANT
BLOWER MISTER CAL-MED (MISCELLANEOUS) IMPLANT
BNDG ELASTIC 4INX 5YD STR LF (GAUZE/BANDAGES/DRESSINGS) IMPLANT
BNDG ELASTIC 6INX 5YD STR LF (GAUZE/BANDAGES/DRESSINGS) IMPLANT
BNDG GAUZE DERMACEA FLUFF 4 (GAUZE/BANDAGES/DRESSINGS) ×2 IMPLANT
CABLE SURGICAL S-101-97-12 (CABLE) ×2 IMPLANT
CANISTER SUCT 3000ML PPV (MISCELLANEOUS) ×2 IMPLANT
CANNULA MC2 2 STG 29/37 NON-V (CANNULA) ×2 IMPLANT
CANNULA NON VENT 20FR 12 (CANNULA) ×2 IMPLANT
CATH ROBINSON RED A/P 18FR (CATHETERS) ×4 IMPLANT
CLIP RETRACTION 3.0MM CORONARY (MISCELLANEOUS) IMPLANT
CLIP TI MEDIUM 24 (CLIP) IMPLANT
CLIP TI WIDE RED SMALL 24 (CLIP) IMPLANT
CONN ST 1/2X1/2 BEN (MISCELLANEOUS) ×2 IMPLANT
CONNECTOR BLAKE 2:1 CARIO BLK (MISCELLANEOUS) ×2 IMPLANT
CONTAINER PROTECT SURGISLUSH (MISCELLANEOUS) ×4 IMPLANT
DERMABOND ADVANCED .7 DNX12 (GAUZE/BANDAGES/DRESSINGS) IMPLANT
DRAIN CHANNEL 19F RND (DRAIN) ×6 IMPLANT
DRAIN CONNECTOR BLAKE 1:1 (MISCELLANEOUS) ×2 IMPLANT
DRAPE INCISE IOBAN 66X45 STRL (DRAPES) IMPLANT
DRAPE SRG 135X102X78XABS (DRAPES) ×2 IMPLANT
DRAPE WARM FLUID 44X44 (DRAPES) ×2 IMPLANT
DRSG AQUACEL AG ADV 3.5X10 (GAUZE/BANDAGES/DRESSINGS) ×2 IMPLANT
ELECT BLADE 4.0 EZ CLEAN MEGAD (MISCELLANEOUS) ×2 IMPLANT
ELECT REM PT RETURN 9FT ADLT (ELECTROSURGICAL) ×4 IMPLANT
ELECTRODE BLDE 4.0 EZ CLN MEGD (MISCELLANEOUS) ×2 IMPLANT
ELECTRODE REM PT RTRN 9FT ADLT (ELECTROSURGICAL) ×4 IMPLANT
FELT TEFLON 1X6 (MISCELLANEOUS) ×4 IMPLANT
GAUZE 4X4 16PLY ~~LOC~~+RFID DBL (SPONGE) ×2 IMPLANT
GAUZE SPONGE 4X4 12PLY STRL (GAUZE/BANDAGES/DRESSINGS) ×4 IMPLANT
GLOVE BIO SURGEON STRL SZ7 (GLOVE) ×4 IMPLANT
GLOVE BIOGEL M STRL SZ7.5 (GLOVE) ×4 IMPLANT
GOWN STRL REUS W/ TWL LRG LVL3 (GOWN DISPOSABLE) ×8 IMPLANT
GOWN STRL REUS W/ TWL XL LVL3 (GOWN DISPOSABLE) ×4 IMPLANT
HEMOSTAT POWDER SURGIFOAM 1G (HEMOSTASIS) ×4 IMPLANT
INSERT SUTURE HOLDER (MISCELLANEOUS) ×2 IMPLANT
KIT BASIN OR (CUSTOM PROCEDURE TRAY) ×2 IMPLANT
KIT SUCTION CATH 14FR (SUCTIONS) ×2 IMPLANT
KIT TURNOVER KIT B (KITS) ×2 IMPLANT
KIT VASOVIEW HEMOPRO 2 VH 4000 (KITS) ×2 IMPLANT
LEAD PACING MYOCARDI (MISCELLANEOUS) ×2 IMPLANT
LOOP VASCLR EXTRA MAXI WHITE (MISCELLANEOUS) IMPLANT
MARKER GRAFT CORONARY BYPASS (MISCELLANEOUS) ×6 IMPLANT
NS IRRIG 1000ML POUR BTL (IV SOLUTION) ×10 IMPLANT
OFFPUMP STABILIZER SUV (MISCELLANEOUS) IMPLANT
PACK E OPEN HEART (SUTURE) ×2 IMPLANT
PACK OPEN HEART (CUSTOM PROCEDURE TRAY) ×2 IMPLANT
PAD ARMBOARD 7.5X6 YLW CONV (MISCELLANEOUS) ×4 IMPLANT
PAD ELECT DEFIB RADIOL ZOLL (MISCELLANEOUS) ×2 IMPLANT
PENCIL BUTTON HOLSTER BLD 10FT (ELECTRODE) ×2 IMPLANT
POSITIONER HEAD DONUT 9IN (MISCELLANEOUS) ×2 IMPLANT
PUNCH AORTIC ROTATE 4.0MM (MISCELLANEOUS) ×2 IMPLANT
SET MPS 3-ND DEL (MISCELLANEOUS) IMPLANT
SPONGE T-LAP 18X18 ~~LOC~~+RFID (SPONGE) ×8 IMPLANT
STOPCOCK 4 WAY LG BORE MALE ST (IV SETS) IMPLANT
SUPPORT HEART JANKE-BARRON (MISCELLANEOUS) ×2 IMPLANT
SUT BONE WAX W31G (SUTURE) ×2 IMPLANT
SUT ETHIBOND X763 2 0 SH 1 (SUTURE) ×4 IMPLANT
SUT MNCRL AB 3-0 PS2 18 (SUTURE) ×4 IMPLANT
SUT MNCRL AB 4-0 PS2 18 (SUTURE) IMPLANT
SUT PDS AB 1 CTX 36 (SUTURE) ×4 IMPLANT
SUT PROLENE 4 0 SH DA (SUTURE) ×2 IMPLANT
SUT PROLENE 5 0 C 1 36 (SUTURE) ×6 IMPLANT
SUT PROLENE 7 0 BV 1 (SUTURE) IMPLANT
SUT PROLENE 7 0 BV1 MDA (SUTURE) ×2 IMPLANT
SUT STEEL 6MS V (SUTURE) ×4 IMPLANT
SUT STEEL STERNAL CCS#1 18IN (SUTURE) IMPLANT
SUT VIC AB 2-0 CT1 TAPERPNT 27 (SUTURE) IMPLANT
SYSTEM SAHARA CHEST DRAIN ATS (WOUND CARE) ×2 IMPLANT
TAPE CLOTH SURG 4X10 WHT LF (GAUZE/BANDAGES/DRESSINGS) IMPLANT
TAPE PAPER 1X10 WHT MICROPORE (GAUZE/BANDAGES/DRESSINGS) IMPLANT
TAPES RETRACTO (MISCELLANEOUS) IMPLANT
TIE VASCULAR EXTRA MAXI WHITE (MISCELLANEOUS) ×2 IMPLANT
TOWEL GREEN STERILE (TOWEL DISPOSABLE) ×2 IMPLANT
TOWEL GREEN STERILE FF (TOWEL DISPOSABLE) ×2 IMPLANT
TRAY FOLEY SLVR 16FR TEMP STAT (SET/KITS/TRAYS/PACK) ×2 IMPLANT
TUBING LAP HI FLOW INSUFFLATIO (TUBING) ×2 IMPLANT
UNDERPAD 30X36 HEAVY ABSORB (UNDERPADS AND DIAPERS) ×2 IMPLANT
VASCULAR TIE EXTRA MAXI WHITE (MISCELLANEOUS) ×2
WATER STERILE IRR 1000ML POUR (IV SOLUTION) ×4 IMPLANT

## 2023-06-14 NOTE — Consult Note (Signed)
 NAME:  Isaac [name], MRN:  [id number], DOB:  January 04, 1946, LOS: 0 ADMISSION DATE:  06/14/2023, CONSULTATION DATE:  06/14/2023 REFERRING MD:  Shyrl MA, CHIEF COMPLAINT:  s/p CABG   History of Present Illness:  78 year old male with past medical history of hyperlipidemia, CAD, RBBB who initially presented to [facility] in 05/2023 with chest pain, shortness of breath. Had coronary CTA showing 100% stenosis in proximal RCA, non-obstructive plaques to the LAD, LCX,. He was referred to Dr. Jordan for [facility] and found to have distal LM 90% stenosed, prox RCA 100%, EF 55-60%. He was referred then to TCTS and saw Dr. Shyrl for evaluation. Decision was made to proceed with CABG. On 1/9 had CABG x1 LIMA to LAD off pump.   Pertinent  Medical History  HLD, CAD, RBBB   Significant Hospital Events: Including procedures, antibiotic start and stop dates in addition to other pertinent events   1/9: s/p CABG x1 LIMA to LAD. EBL 75cc  Interim History / Subjective:  Patient unable to participate in the subjective portion of the exam. Intubated, weaning off sedation.   Objective   Blood pressure 112/76, pulse (!) 54, temperature (!) 92.8 F (33.8 C), resp. rate 16, height 5' 10 (1.778 m), weight 100.2 kg, SpO2 99%. CVP:  [3 mmHg-6 mmHg] 3 mmHg CO:  [5.5 L/min-7.2 L/min] 5.5 L/min CI:  [2.5 L/min/m2-3.3 L/min/m2] 2.5 L/min/m2  Vent Mode: SIMV;PRVC;PSV FiO2 (%):  [50 %] 50 % Set Rate:  [16 bmp] 16 bmp Vt Set:  [580 mL] 580 mL PEEP:  [5 cmH20] 5 cmH20 Pressure Support:  [10 cmH20] 10 cmH20 Plateau Pressure:  [15 cmH20] 15 cmH20   Intake/Output Summary (Last 24 hours) at 06/14/2023 1303 Last data filed at 06/14/2023 1159 Gross per 24 hour  Intake 3000 ml  Output 390 ml  Net 2610 ml   Filed Weights   06/14/23 0636  Weight: 100.2 kg    Examination: General: older male, laying in bed, intubated, no acute distress  HENT: [place]//AT, anicteric sclera, pupils 2mm reactive, mucous membranes  moist Lungs: clear bilaterally, ETT, SIMV(PS) wean mode 50%/5 Cardiovascular: s1s2 no murmur borderline sinus bradycardia rate 59, CT x1 Abdomen: rounded, soft, non-distended  Extremities: no pitting edema Neuro: weaning sedation  GU: foley  Resolved Hospital Problem list    Assessment & Plan:  Multivessel CAD s/p CABG x1 LIMA to LAD Post bypass vasoplegia Acute postoperative respiratory insufficiency Expected post-operative ABLA Expected post-operative consumptive thrombocytopenia  - con't insulin  gtt , wean per endotool  - complete perioperative antibiotics with ancef , vanc - post-op management per TCTS - rapid wean per TCTS protocol - VAP/ PPI - f/u CXR/ ABG, CBC, BMET now - con't weaning pressors as able to maintain MAP >70-90. Albumin  boluses prn for low SV.  - tele monitoring - mediastinal drains per TCTS - multimodal pain control per protocol- oxycodone , tramadol , morphine  with bowel regimen - ASA, statin to resume next day  - metoprolol  BID once off pressors - monitor electrolytes, replete PRN   HLD  - statin to start tomorrow 1/10  Best Practice (right click and Reselect all SmartList Selections daily)   Diet/type: NPO DVT prophylaxis: SCD Pressure ulcer(s): na GI prophylaxis: PPI Lines: Central line, Arterial Line, and yes and it is still needed Foley:  Yes, and it is still needed Code Status:  full code Last date of multidisciplinary goals of care discussion [per primary]  Labs   CBC: Recent Labs  Lab 06/13/23 1441 06/14/23 0823 06/14/23 1046  06/14/23 1129 06/14/23 1135 06/14/23 1225 06/14/23 1228  WBC 6.2  --   --   --   --  10.1  --   HGB 14.2   < > 11.2* 10.2* 9.9* 11.4* 11.2*  HCT 42.6   < > 33.0* 30.0* 29.0* 33.7* 33.0*  MCV 89.3  --   --   --   --  88.9  --   PLT 261  --   --   --   --  185  --    < > = values in this interval not displayed.    Basic Metabolic Panel: Recent Labs  Lab 06/13/23 1441 06/14/23 0823 06/14/23 0826  06/14/23 1019 06/14/23 1043 06/14/23 1046 06/14/23 1129 06/14/23 1135 06/14/23 1228  NA 138 139   < > 140 139 140 140 140 138  K 4.2 3.9   < > 3.9 4.1 4.1 4.0 3.8 4.0  CL 107 105  --  110 106  --  108  --   --   CO2 24  --   --   --   --   --   --   --   --   GLUCOSE 116* 111*  --  141* 144*  --  148*  --   --   BUN 19 21  --  20 21  --  21  --   --   CREATININE 0.83 0.80  --  0.70 0.70  --  0.70  --   --   CALCIUM  8.9  --   --   --   --   --   --   --   --    < > = values in this interval not displayed.   GFR: Estimated Creatinine Clearance: 90.3 mL/min (by C-G formula based on SCr of 0.7 mg/dL). Recent Labs  Lab 06/13/23 1441 06/14/23 1225  WBC 6.2 10.1    Liver Function Tests: Recent Labs  Lab 06/13/23 1441  AST 21  ALT 20  ALKPHOS 70  BILITOT 1.3*  PROT 7.0  ALBUMIN  4.1   No results for input(s): LIPASE, AMYLASE in the last 168 hours. No results for input(s): AMMONIA in the last 168 hours.  ABG    Component Value Date/Time   PHART 7.385 06/14/2023 1228   PCO2ART 34.5 06/14/2023 1228   PO2ART 83 06/14/2023 1228   HCO3 21.4 06/14/2023 1228   TCO2 23 06/14/2023 1228   ACIDBASEDEF 4.0 (H) 06/14/2023 1228   O2SAT 97 06/14/2023 1228     Coagulation Profile: Recent Labs  Lab 06/13/23 1441  INR 1.1    Cardiac Enzymes: No results for input(s): CKTOTAL, CKMB, CKMBINDEX, TROPONINI in the last 168 hours.  HbA1C: Hgb A1c MFr Bld  Date/Time Value Ref Range Status  06/13/2023 02:41 PM 5.5 4.8 - 5.6 % Final    Comment:    (NOTE) Pre diabetes:          5.7%-6.4%  Diabetes:              >6.4%  Glycemic control for   <7.0% adults with diabetes     CBG: Recent Labs  Lab 06/14/23 1226 06/14/23 1301  GLUCAP 146* 126*    Review of Systems:   As above   Past Medical History:  He,  has a past medical history of Abnormal fasting glucose, Acute right-sided low back pain with right-sided sciatica, Anginal pain (HCC), Arthritis, BMI  35.0-35.9,adult, Complication of anesthesia, Coronary artery disease, Difficult intubation, DJD (  degenerative joint disease) of knee, Family history of bipolar disorder, Heart murmur, History of kidney stones, HLD (hyperlipidemia), Overweight, and Piriformis syndrome of right side.   Surgical History:   Past Surgical History:  Procedure Laterality Date   JOINT REPLACEMENT  2010   L TKA   LEFT HEART CATH AND CORONARY ANGIOGRAPHY N/A 06/01/2023   Procedure: LEFT HEART CATH AND CORONARY ANGIOGRAPHY;  Surgeon: Jordan, Peter M, MD;  Location: [facility] INVASIVE CV LAB;  Service: Cardiovascular;  Laterality: N/A;   TOTAL KNEE ARTHROPLASTY Right 03/19/2013   Procedure: TOTAL KNEE ARTHROPLASTY;  Surgeon: Toribio JULIANNA Chancy, MD;  Location[facility] OR;  Service: Orthopedics;  Laterality: Right;   TOTAL KNEE ARTHROPLASTY Bilateral      Social History:   reports that he quit smoking about 28 years ago. His smoking use included cigarettes. He has never used smokeless tobacco. He reports current alcohol  use. He reports that he does not use drugs.   Family History:  His family history includes Bipolar disorder in his father; COPD in his brother; Cancer in his father; Other in his mother; Suicidality in his brother.   Allergies No Known Allergies   Home Medications  Prior to Admission medications   Medication Sig Start Date End Date Taking? Authorizing Provider  aspirin  EC 81 MG tablet Take 81 mg by mouth at bedtime. Swallow whole.   Yes [provider]  carvedilol  (COREG ) 6.25 MG tablet Take 1 tablet (6.25 mg total) by mouth 2 (two) times daily. 04/27/23  Yes Chandrasekhar, Mahesh A, MD  Ibuprofen-diphenhydrAMINE  HCl (ADVIL PM) 200-25 MG CAPS Take 2 tablets by mouth at bedtime.   Yes [provider]  isosorbide  mononitrate (IMDUR ) 30 MG 24 hr tablet Take 1 tablet (30 mg total) by mouth daily. 05/22/23  Yes Chandrasekhar, Mahesh A, MD  MAGNESIUM  PO Take 1 tablet by mouth at bedtime.   Yes [provider]  Multiple Vitamin (MULTI VITAMIN) TABS Take 1 tablet by mouth at bedtime.   Yes [provider]  nitroGLYCERIN  (NITROSTAT ) 0.4 MG SL tablet Place 1 tablet (0.4 mg total) under the tongue every 5 (five) minutes as needed. May take up to 3 tablets in 15 min. 04/27/23  Yes Chandrasekhar, Mahesh A, MD  pravastatin  (PRAVACHOL ) 40 MG tablet Take 1 tablet (40 mg total) by mouth every evening. 05/25/23  Yes Chandrasekhar, Mahesh A, MD  TURMERIC PO Take 1 tablet by mouth at bedtime.   Yes [provider]     Critical care time: 60    Tinnie FORBES Adolph DEVONNA [facility] 06/14/23 1:03 PM  Please see [email or web address] for pager details.  From 7A-7P if no response, please call [phone] After hours, please call ELink [phone]

## 2023-06-14 NOTE — Procedures (Signed)
 Extubation Procedure Note  Patient Details:   Name: Isaac [name] DOB: [birth year] MRN: [id number]   Airway Documentation:    Vent end date: 06/14/23 Vent end time: 1555   Evaluation  O2 sats: stable throughout Complications: No apparent complications Patient did tolerate procedure well. Bilateral Breath Sounds: Clear, Diminished   Yes  Patient extubated per protocol to 4L [place] with no complications. Positive cuff leak was noted prior to extubation. Patient achieved NIF of -24 and VC of .7L with good effort. Patient is alert and oriented, has strong cough, and is able to speak. Vitals are stable. RT will continue to monitor.   Alexandrea Westergard CHRISTELLA Anon 06/14/2023, 5:01 PM

## 2023-06-14 NOTE — Anesthesia Procedure Notes (Signed)
 Arterial Line Insertion Start/End1/02/2024 7:16 AM, 06/14/2023 7:50 AM Performed by: Leopoldo Wanda DASEN, CRNA, CRNA  Patient location: Pre-op. Preanesthetic checklist: patient identified, IV checked, site marked, risks and benefits discussed, surgical consent, monitors and equipment checked, pre-op evaluation, timeout performed and anesthesia consent Lidocaine  1% used for infiltration Right, radial was placed Catheter size: 20 G Hand hygiene performed  and maximum sterile barriers used   Attempts: 3 Procedure performed without using ultrasound guided technique. Following insertion, dressing applied. Post procedure assessment: normal and unchanged  Post procedure complications: second provider assisted. Patient tolerated the procedure well with no immediate complications. Additional procedure comments: Left radial x2 per SRNA, unable to thread wire into artery. Third attempt on right radial per CRNA.

## 2023-06-14 NOTE — Anesthesia Procedure Notes (Signed)
 Procedure Name: Intubation Date/Time: 06/14/2023 8:15 AM  Performed by: Leopoldo Wanda DASEN, CRNAPre-anesthesia Checklist: Patient identified, Emergency Drugs available, Suction available and Patient being monitored Patient Re-evaluated:Patient Re-evaluated prior to induction Oxygen Delivery Method: Circle System Utilized Preoxygenation: Pre-oxygenation with 100% oxygen Induction Type: IV induction Ventilation: Mask ventilation without difficulty and Oral airway inserted - appropriate to patient size Laryngoscope Size: Glidescope and 4 Grade View: Grade I Tube type: Oral Tube size: 7.5 mm Number of attempts: 1 Airway Equipment and Method: Stylet, Oral airway and Video-laryngoscopy Placement Confirmation: ETT inserted through vocal cords under direct vision, positive ETCO2 and breath sounds checked- equal and bilateral Secured at: 24 cm Tube secured with: Tape Dental Injury: Teeth and Oropharynx as per pre-operative assessment  Difficulty Due To: Difficulty was anticipated and Difficult Airway- due to reduced neck mobility Future Recommendations: Recommend- induction with short-acting agent, and alternative techniques readily available Comments: Intubation performed by Lauraine Rana, SRNA. Glidescope used for previous anesthetic, masked with OPA and two hands for adequate tidal volume. Atraumatic intubation

## 2023-06-14 NOTE — Transfer of Care (Signed)
 Immediate Anesthesia Transfer of Care Note  Patient: Isaac [name]  Procedure(s) Performed: TRANSESOPHAGEAL ECHOCARDIOGRAM (TEE) OFF PUMP CORONARY ARTERY BYPASS GRAFTING X 1, USING LEFT INTERNAL MAMMARY ARTERY (Chest)  Patient Location: SICU  Anesthesia Type:General  Level of Consciousness: sedated and Patient remains intubated per anesthesia plan  Airway & Oxygen Therapy: Patient remains intubated per anesthesia plan and Patient placed on Ventilator (see vital sign flow sheet for setting)  Post-op Assessment: Report given to RN and Post -op Vital signs reviewed and stable  Post vital signs: Reviewed and stable  Last Vitals:  Vitals Value Taken Time  BP 130/57 1222  Temp    Pulse 59 06/14/23 1222  Resp 18 06/14/23 1222  SpO2 97 % 06/14/23 1222  Vitals shown include unfiled device data.  Last Pain:  Vitals:   06/14/23 0652  TempSrc:   PainSc: 1          Complications:  Encounter Notable Events  Notable Event Outcome Phase Comment  Difficult to intubate - expected  Intraprocedure Filed from anesthesia note documentation.

## 2023-06-14 NOTE — Discharge Summary (Signed)
 [address]       [place] [address]             [phone]    Physician Discharge Summary  Patient ID: Isaac [name] MRN: [id number] DOB/AGE: 12/25/1945 78 y.o.  Admit date: 06/14/2023 Discharge date: 06/20/2023  Admission Diagnoses:  Patient Active Problem List   Diagnosis Date Noted   Coronary artery disease 06/14/2023   Abnormal EKG 04/27/2023   Chest pain on exertion 04/27/2023   Precordial pain 04/27/2023   Aortic atherosclerosis (HCC) 04/27/2023   Hyperlipidemia 04/27/2023   Angina pectoris (HCC) 04/27/2023   Systolic murmur 04/27/2023   Hypertension 04/27/2023     Discharge Diagnoses:  Patient Active Problem List   Diagnosis Date Noted   Coronary artery disease 06/14/2023   Abnormal EKG 04/27/2023   Chest pain on exertion 04/27/2023   Precordial pain 04/27/2023   Aortic atherosclerosis (HCC) 04/27/2023   Hyperlipidemia 04/27/2023   Angina pectoris (HCC) 04/27/2023   Systolic murmur 04/27/2023   Hypertension 04/27/2023     Discharged Condition: Stable  HPI: This is a 78 year old male with a past medical history of hyperlipidemia, obesity, and remote tobacco abuse who was seen in the office by Dr. Shyrl for the evaluation of coronary artery disease. Patient underwent a cardiac catheterization on 06/01/2023 and was found to have a LVEF 55-65% and severe 2 vessel coronary artery disease. Dr. Shyrl discussed the need for coronary artery bypass grafting surgery. Potential risks, benefits, and complications of the surgery were discussed with the patient and he agreed to proceed with surgery. Pre operative carotid duplex US  showed no significant internal carotid artery stenosis bilaterally.   Hospital Course: Patient underwent a CABG x 1 (LIMA to LAD) off pump. He was transported from the OR to [facility] in stable condition. He was extubated 01/09 without complication. His drips were weaned as hemodynamics tolerated. Arterial line was  removed without complication.  On postop day #2 pacing wire,  chest tubes and central line were removed.  He was started on a course of diuresis for volume overload expected for heart surgery and not congestive heart failure.  He did develop some hallucinations and narcotics were discontinued.  He was started on Toradol  for pain. He was transitioned off the Insulin  drip. His pre op HGA1C was 5.5. Accu checks and SS PRN will be stopped after transfer to the floor. He was started on Amlodipine . He was stable for transfer from the ICU to 4E for further convalescence on 01/13. He has been maintaining tachycardic on Lopressor  so this has been titrated for better control to 50 mg bid on 01/15.SABRA He has been ambulating on room air with good oxygenation. He has been tolerating a diet and has had a bowel movement. All wounds are clean, dry, healing without signs of infection. Because BP was somewhat labile, Amlodipine  was stopped on 01/14 so that he can continue to receive his Lopressor . He is felt stable for discharge today.  Consults: pulmonary/intensive care  Significant Diagnostic Studies:   CLINICAL DATA:  Postop from CABG.   EXAM: PORTABLE CHEST 1 VIEW   COMPARISON:  06/16/2023   FINDINGS: Support lines and tubes have been removed. No pneumothorax visualized. Mild bibasilar atelectasis is again seen. Heart size is normal.   IMPRESSION: Removal of support lines and tubes. No pneumothorax visualized.   Mild bibasilar atelectasis.     Electronically Signed   By: Norleen DELENA Kil M.D.   On:  06/17/2023 09:12  Treatments: surgery:  TRANSESOPHAGEAL ECHOCARDIOGRAM (TEE),  OFF PUMP CORONARY ARTERY BYPASS GRAFTING X 1 (LIMA to LAD) , USING LEFT INTERNAL MAMMARY ARTERY, BILATERAL LEG EXPLORATION FOR SAPHENOUS VEIN GRAFT and BILATERAL ABOVE the ANKLE EXPLORATION by Dr. Shyrl on 06/14/2023.  Discharge Exam:  Cardiovascular: Tachycardic Pulmonary: Clear to auscultation bilaterally Abdomen: Soft,  non tender, bowel sounds present. Extremities: No lower extremity edema. Wounds: Clean and dry.  No erythema or signs of infection.   Discharge Medications:  The patient has been discharged on:   1.Beta Blocker:  Yes [x   ]                              No   [   ]                              If No, reason:  2.Ace Inhibitor/ARB: Yes [   ]                                     No  [   x ]                                     If No, reason:Labile BP  3.Statin:   Yes [ x  ]                  No  [   ]                  If No, reason:  4.Ecasa:  Yes  [   ]                  No   [ x  ]                  If No, reason:  Patient had ACS upon admission:  Plavix/P2Y12 inhibitor: Yes [   ]                                      No  [  x ]     Discharge Instructions     Amb Referral to Cardiac Rehabilitation   Complete by: As directed    Diagnosis: CABG   CABG X ___: 1   After initial evaluation and assessments completed: Virtual Based Care may be provided alone or in conjunction with Phase 2 Cardiac Rehab based on patient barriers.: Yes   Intensive Cardiac Rehabilitation (ICR) [facility] location only OR Traditional Cardiac Rehabilitation (TCR) *If criteria for ICR are not met will enroll in TCR ([facility] only): Yes      Allergies as of 06/20/2023   No Known Allergies      Medication List     STOP taking these medications    carvedilol  6.25 MG tablet Commonly known as: COREG    isosorbide  mononitrate 30 MG 24 hr tablet Commonly known as: IMDUR    nitroGLYCERIN  0.4 MG SL tablet Commonly known as: NITROSTAT    pravastatin  40 MG tablet Commonly known as: PRAVACHOL        TAKE these medications    Advil PM 200-25 MG  Caps Generic drug: Ibuprofen-diphenhydrAMINE  HCl Take 2 tablets by mouth at bedtime.   aspirin  EC 325 MG tablet Take 1 tablet (325 mg total) by mouth daily. What changed:  medication strength how much to take when to take this additional instructions    atorvastatin  40 MG tablet Commonly known as: LIPITOR Take 1 tablet (40 mg total) by mouth daily.   MAGNESIUM  PO Take 1 tablet by mouth at bedtime.   metoprolol  tartrate 50 MG tablet Commonly known as: LOPRESSOR  Take 1 tablet (50 mg total) by mouth 2 (two) times daily.   Multi Vitamin Tabs Take 1 tablet by mouth at bedtime.   traMADol  50 MG tablet Commonly known as: ULTRAM  Take 1 tablet (50 mg total) by mouth every 6 (six) hours as needed for severe pain (pain score 7-10).   TURMERIC PO Take 1 tablet by mouth at bedtime.        Follow-up Information     Lightfoot, Linnie KIDD, MD Follow up.   Specialty: Cardiothoracic Surgery Why: Appointment is VIRTUAL;please do NOT go to the office. Dr. Shyrl will call you on 01/24 at 3:20 pm Contact information: [address] [place] [address] [phone]         Abigail Raphael SAILOR, PA-C. Go on 07/05/2023.   Specialties: Cardiology, Radiology Why: Appointment time is at 8:25 am Contact information: [address] [place] [address] [phone]         [facility] Follow up.   Why: Home Health agency will call to arrange visit Contact information: [phone]                Signed:  Kyla CHRISTELLA Donald, PA-C 06/20/2023, 8:51 AM

## 2023-06-14 NOTE — Anesthesia Procedure Notes (Signed)
 Central Venous Catheter Insertion Performed by: Epifanio Fallow, MD, anesthesiologist Start/End1/02/2024 7:10 AM, 06/14/2023 7:30 AM Patient location: Pre-op. Preanesthetic checklist: patient identified, IV checked, site marked, risks and benefits discussed, surgical consent, monitors and equipment checked, pre-op evaluation, timeout performed and anesthesia consent Position: Trendelenburg Lidocaine  1% used for infiltration and patient sedated Hand hygiene performed , maximum sterile barriers used  and Seldinger technique used Catheter size: 8.5 Fr Total catheter length 10. Central line was placed.Sheath introducer Procedure performed using ultrasound guided technique. Ultrasound Notes:anatomy identified, needle tip was noted to be adjacent to the nerve/plexus identified, no ultrasound evidence of intravascular and/or intraneural injection and image(s) printed for medical record Attempts: 1 Following insertion, line sutured, dressing applied and Biopatch. Post procedure assessment: blood return through all ports, free fluid flow and no air  Patient tolerated the procedure well with no immediate complications. Additional procedure comments: 3 Lumen slick catheter was placed through the [facility] port.SABRA

## 2023-06-14 NOTE — Brief Op Note (Signed)
 06/14/2023  11:37 AM  PATIENT:  Isaac [name]  78 y.o. male  PRE-OPERATIVE DIAGNOSIS:  CORONARY ARTERY DISEASE  POST-OPERATIVE DIAGNOSIS:  CORONARY ARTERY DISEASE  PROCEDURE:  TRANSESOPHAGEAL ECHOCARDIOGRAM (TEE),  OFF PUMP CORONARY ARTERY BYPASS GRAFTING X 1 (LIMA to LAD) , USING LEFT INTERNAL MAMMARY ARTERY, BILATERAL LEG EXPLORATION FOR SAPHENOUS VEIN GRAFT and BILATERAL ABOVE the ANKLE EXPLORATION Findings: Multiple varicosities and no usable greater saphenous vein  SURGEON:  Surgeons and Role:    Shyrl Linnie KIDD, MD - Primary  PHYSICIAN ASSISTANT: Kyla Donald PA-C  ASSISTANTS: Jerel Fees RNFA   ANESTHESIA:   general  EBL:  Per anesthesia record  DRAINS:  Chest tubes placed in the mediastinal and pleural spaces    COUNTS CORRECT:  YES  DICTATION: .Dragon Dictation  PLAN OF CARE: Admit to inpatient   PATIENT DISPOSITION:  ICU - intubated and hemodynamically stable.   Delay start of Pharmacological VTE agent (>24hrs) due to surgical blood loss or risk of bleeding: no  BASELINE WEIGHT: 100.2 kg

## 2023-06-14 NOTE — Anesthesia Postprocedure Evaluation (Signed)
 Anesthesia Post Note  Patient: Isaac [name]  Procedure(s) Performed: TRANSESOPHAGEAL ECHOCARDIOGRAM (TEE) OFF PUMP CORONARY ARTERY BYPASS GRAFTING X 1, USING LEFT INTERNAL MAMMARY ARTERY (Chest)     Patient location during evaluation: SICU Anesthesia Type: General Level of consciousness: sedated Pain management: pain level controlled Vital Signs Assessment: post-procedure vital signs reviewed and stable Respiratory status: patient remains intubated per anesthesia plan Cardiovascular status: stable Postop Assessment: no apparent nausea or vomiting Anesthetic complications: yes  Encounter Notable Events  Notable Event Outcome Phase Comment  Difficult to intubate - expected  Intraprocedure Filed from anesthesia note documentation.    Last Vitals:  Vitals:   06/14/23 1515 06/14/23 1530  BP:    Pulse: 76   Resp: (!) 23   Temp: (!) 35.6 C   SpO2: 96% 94%    Last Pain:  Vitals:   06/14/23 1610  TempSrc:   PainSc: 10-Worst pain ever                 Sophiah Rolin,W. EDMOND

## 2023-06-14 NOTE — Op Note (Signed)
     [address]       [place] [address]             [phone]                                         06/14/2023    Patient:  Isaac [name] Pre-Op Dx: LILLA CAD HTN HLP   Post-op Dx:  same Procedure: Off pump CABG X 1.  LIMA LAD      Surgeon and Role:      * Braedan Meuth, Linnie KIDD, MD - Primary    * D.Zimmerman, PA-C - assisting  Anesthesia  general EBL:  Blood Administration: none  Drains: 17 F blake drain: L, mediastinal  Wires: none Counts: correct   Indications: 79 year old male with three-vessel coronary disease with stable angina.  His left heart catheterization shows preserved biventricular function, and no significant valvular disease.  We discussed the risks and benefits of surgical revascularization.  He is agreeable to proceed.   Findings: We were unable to find any usable vein in either lower extremities due to the varicosities.  Due to the fact that he had developed left to right collaterals, and that the PDA target was quite distal, I elected not to harvest the RIMA for bypass.    Operative Technique: After the risks, benefits and alternatives were thoroughly discussed, the patient was brought to the operative theatre.  All invasive lines were placed in pre-op holding.  Anesthesia was induced, and the patient was prepped and draped in normal sterile fashion.  An appropriate surgical pause was performed, and pre-operative antibiotics were dosed accordingly.  We began with simultaneous incision along the chest for the sternotomy.  In regards to the sternotomy, this was carried down with bovie cautery, and the sternum was divided with a reciprocating saw.  Meticulous hemostasis was obtained.  The left internal thoracic artery was exposed and harvested in in pedicled fashion.  The patient was systemically heparinized, and the artery was divided distally, and placed in a papaverine  sponge.    The sternal elevator was removed, and a  retractor was placed.  The pericardium was divided in the midline and fashioned into a cradle with pericardial stitches.  Next, we exposed a good target on the LAD, and fashioned an end to side anastomosis between it and the LITA  Meticulous hemostasis was obtained.    The heparin  was reversed with protamine .  Chest tubes and wires were placed, and the sternum was re-approximated with with sternal wires.  The soft tissue and skin were re-approximated wth absorbable suture.    The patient tolerated the procedure without any immediate complications, and was transferred to the ICU in guarded condition.  [name]

## 2023-06-14 NOTE — Interval H&P Note (Signed)
 History and Physical Interval Note:  06/14/2023 7:33 AM  Isaac [name]  has presented today for surgery, with the diagnosis of CAD.  The various methods of treatment have been discussed with the patient and family. After consideration of risks, benefits and other options for treatment, the patient has consented to  Procedure(s): CORONARY ARTERY BYPASS GRAFTING (CABG) (N/A) TRANSESOPHAGEAL ECHOCARDIOGRAM (TEE) (N/A) as a surgical intervention.  The patient's history has been reviewed, patient examined, no change in status, stable for surgery.  I have reviewed the patient's chart and labs.  Questions were answered to the patient's satisfaction.     Isaac [name]

## 2023-06-15 ENCOUNTER — Encounter (HOSPITAL_COMMUNITY): Payer: Self-pay | Admitting: Thoracic Surgery (Cardiothoracic Vascular Surgery)

## 2023-06-15 ENCOUNTER — Inpatient Hospital Stay (HOSPITAL_COMMUNITY): Payer: PPO

## 2023-06-15 ENCOUNTER — Encounter: Payer: PPO | Admitting: Thoracic Surgery (Cardiothoracic Vascular Surgery)

## 2023-06-15 DIAGNOSIS — I2510 Atherosclerotic heart disease of native coronary artery without angina pectoris: Principal | ICD-10-CM

## 2023-06-15 LAB — CBC
HCT: 38.2 % — ABNORMAL LOW (ref 39.0–52.0)
HCT: 37.1 % — ABNORMAL LOW (ref 39.0–52.0)
Hemoglobin: 13.1 g/dL (ref 13.0–17.0)
Hemoglobin: 12.8 g/dL — ABNORMAL LOW (ref 13.0–17.0)
MCH: 30.3 pg (ref 26.0–34.0)
MCH: 30.5 pg (ref 26.0–34.0)
MCHC: 34.3 g/dL (ref 30.0–36.0)
MCHC: 34.5 g/dL (ref 30.0–36.0)
MCV: 88.4 fL (ref 80.0–100.0)
MCV: 88.3 fL (ref 80.0–100.0)
Platelets: 249 10*3/uL (ref 150–400)
Platelets: 252 10*3/uL (ref 150–400)
RBC: 4.32 MIL/uL (ref 4.22–5.81)
RBC: 4.2 MIL/uL — ABNORMAL LOW (ref 4.22–5.81)
RDW: 13.1 % (ref 11.5–15.5)
RDW: 13.3 % (ref 11.5–15.5)
WBC: 13.2 10*3/uL — ABNORMAL HIGH (ref 4.0–10.5)
WBC: 11.3 10*3/uL — ABNORMAL HIGH (ref 4.0–10.5)
nRBC: 0 % (ref 0.0–0.2)
nRBC: 0 % (ref 0.0–0.2)

## 2023-06-15 LAB — GLUCOSE, CAPILLARY
Glucose-Capillary: 121 mg/dL — ABNORMAL HIGH (ref 70–99)
Glucose-Capillary: 131 mg/dL — ABNORMAL HIGH (ref 70–99)
Glucose-Capillary: 120 mg/dL — ABNORMAL HIGH (ref 70–99)
Glucose-Capillary: 118 mg/dL — ABNORMAL HIGH (ref 70–99)
Glucose-Capillary: 138 mg/dL — ABNORMAL HIGH (ref 70–99)
Glucose-Capillary: 139 mg/dL — ABNORMAL HIGH (ref 70–99)
Glucose-Capillary: 98 mg/dL (ref 70–99)
Glucose-Capillary: 130 mg/dL — ABNORMAL HIGH (ref 70–99)

## 2023-06-15 LAB — BASIC METABOLIC PANEL
Anion gap: 9 (ref 5–15)
Anion gap: 8 (ref 5–15)
BUN: 16 mg/dL (ref 8–23)
BUN: 15 mg/dL (ref 8–23)
CO2: 20 mmol/L — ABNORMAL LOW (ref 22–32)
CO2: 21 mmol/L — ABNORMAL LOW (ref 22–32)
Calcium: 8.3 mg/dL — ABNORMAL LOW (ref 8.9–10.3)
Calcium: 8.3 mg/dL — ABNORMAL LOW (ref 8.9–10.3)
Chloride: 106 mmol/L (ref 98–111)
Chloride: 103 mmol/L (ref 98–111)
Creatinine, Ser: 0.69 mg/dL (ref 0.61–1.24)
Creatinine, Ser: 0.74 mg/dL (ref 0.61–1.24)
GFR, Estimated: >60 mL/min (ref >60)
GFR, Estimated: >60 mL/min (ref >60)
Glucose, Bld: 119 mg/dL — ABNORMAL HIGH (ref 70–99)
Glucose, Bld: 128 mg/dL — ABNORMAL HIGH (ref 70–99)
Potassium: 4.2 mmol/L (ref 3.5–5.1)
Potassium: 3.7 mmol/L (ref 3.5–5.1)
Sodium: 135 mmol/L (ref 135–145)
Sodium: 132 mmol/L — ABNORMAL LOW (ref 135–145)

## 2023-06-15 LAB — MAGNESIUM
Magnesium: 2.3 mg/dL (ref 1.7–2.4)
Magnesium: 2.3 mg/dL (ref 1.7–2.4)

## 2023-06-15 MED ORDER — ATORVASTATIN CALCIUM 40 MG PO TABS
40.0000 mg | ORAL_TABLET | Freq: Every day | ORAL | Status: DC
  Administered 2023-06-15 – 2023-06-20 (×6): 40 mg via ORAL
  Filled 2023-06-15 (×6): qty 1

## 2023-06-15 MED ORDER — METOPROLOL TARTRATE 25 MG/10 ML ORAL SUSPENSION
25.0000 mg | Freq: Two times a day (BID) | ORAL | Status: DC
  Filled 2023-06-15 (×2): qty 10

## 2023-06-15 MED ORDER — ADULT MULTIVITAMIN W/MINERALS CH
1.0000 | ORAL_TABLET | Freq: Every day | ORAL | Status: DC
  Administered 2023-06-15 – 2023-06-19 (×5): 1 via ORAL
  Filled 2023-06-15 (×5): qty 1

## 2023-06-15 MED ORDER — INSULIN ASPART 100 UNIT/ML IJ SOLN
0.0000 [IU] | Freq: Three times a day (TID) | INTRAMUSCULAR | Status: DC
  Administered 2023-06-15: 1 [IU] via SUBCUTANEOUS

## 2023-06-15 MED ORDER — METOPROLOL TARTRATE 25 MG PO TABS
25.0000 mg | ORAL_TABLET | Freq: Two times a day (BID) | ORAL | Status: DC
  Administered 2023-06-15 – 2023-06-19 (×9): 25 mg via ORAL
  Filled 2023-06-15 (×9): qty 1

## 2023-06-15 MED ORDER — INSULIN ASPART 100 UNIT/ML IJ SOLN: 0.0000 [IU] | INTRAMUSCULAR | Status: DC

## 2023-06-15 MED ORDER — ORAL CARE MOUTH RINSE: 15.0000 mL | OROMUCOSAL | Status: DC | PRN

## 2023-06-15 MED ORDER — ORAL CARE MOUTH RINSE
15.0000 mL | OROMUCOSAL | Status: DC
  Administered 2023-06-15 – 2023-06-20 (×18): 15 mL via OROMUCOSAL

## 2023-06-15 MED ORDER — POTASSIUM CHLORIDE CRYS ER 20 MEQ PO TBCR
20.0000 meq | EXTENDED_RELEASE_TABLET | ORAL | Status: AC
  Administered 2023-06-15 (×2): 20 meq via ORAL
  Filled 2023-06-15 (×2): qty 1

## 2023-06-15 MED ORDER — AMLODIPINE BESYLATE 5 MG PO TABS
5.0000 mg | ORAL_TABLET | Freq: Every day | ORAL | Status: DC
  Administered 2023-06-15 – 2023-06-18 (×4): 5 mg via ORAL
  Filled 2023-06-15 (×5): qty 1

## 2023-06-15 MED FILL — Cefazolin Sodium-Dextrose IV Solution 2 GM/100ML-4%: INTRAVENOUS | Qty: 100 | Status: AC

## 2023-06-15 MED FILL — Lidocaine HCl Local Preservative Free (PF) Inj 2%: INTRAMUSCULAR | Qty: 14 | Status: AC

## 2023-06-15 MED FILL — Heparin Sodium (Porcine) Inj 1000 Unit/ML: Qty: 1000 | Status: AC

## 2023-06-15 MED FILL — Potassium Chloride Inj 2 mEq/ML: INTRAVENOUS | Qty: 40 | Status: AC

## 2023-06-15 NOTE — Plan of Care (Signed)

## 2023-06-15 NOTE — Progress Notes (Signed)
   NAME:  Isaac [name], MRN:  [id number], DOB:  05/29/1946, LOS: 1 ADMISSION DATE:  06/14/2023, CONSULTATION DATE:  06/14/2023 REFERRING MD:  Shyrl MA, CHIEF COMPLAINT:  s/p CABG   History of Present Illness:  78 year old male with past medical history of hyperlipidemia, CAD, RBBB who [facility] with chest pain, shortness of breath. Had coronary CTA showing 100% stenosis in proximal RCA, non-obstructive plaques to the LAD, LCX,. He was referred to Dr. Jordan for [facility] and found to have distal LM 90% stenosed, prox RCA 100%, EF 55-60%. He was referred then to [facility] and saw Dr. Shyrl for evaluation. Decision was made to proceed with CABG. On 1/9 had CABG x1 LIMA to LAD off pump.   Pertinent  Medical History  HLD, CAD, RBBB   Significant Hospital Events: Including procedures, antibiotic start and stop dates in addition to other pertinent events   1/9: s/p CABG x1 LIMA to LAD. EBL 75cc  Interim History / Subjective:  Extubated, pain 2/10, asking about mobility.  Objective   Blood pressure 125/65, pulse 81, temperature 98.6 F (37 C), temperature source Oral, resp. rate (!) 23, height 5' 10 (1.778 m), weight 98.1 kg, SpO2 91%. CVP:  [2 mmHg-27 mmHg] 9 mmHg CO:  [4.4 L/min-11 L/min] 6.4 L/min CI:  [2 L/min/m2-5 L/min/m2] 2.9 L/min/m2  Vent Mode: PSV;CPAP FiO2 (%):  [36 %-50 %] 36 % Set Rate:  [4 bmp-16 bmp] 4 bmp Vt Set:  [580 mL] 580 mL PEEP:  [5 cmH20] 5 cmH20 Pressure Support:  [10 cmH20] 10 cmH20 Plateau Pressure:  [15 cmH20] 15 cmH20   Intake/Output Summary (Last 24 hours) at 06/15/2023 9177 Last data filed at 06/15/2023 0800 Gross per 24 hour  Intake 5023.16 ml  Output 2113 ml  Net 2910.16 ml   Filed Weights   06/14/23 0636 06/15/23 0500  Weight: 100.2 kg 98.1 kg    Examination: No distress Sternotomy dressed Mediastinal drains minimal output Pulling 750 on IS CVL, foley all look fine BMP ok  CXR some patchiness L base   sodium chloride  Stopped (06/14/23 1858)   sodium chloride      sodium chloride      albumin  human Stopped (06/14/23 1330)    ceFAZolin  (ANCEF ) IV Stopped (06/15/23 0534)   clevidipine  8 mg/hr (06/15/23 0800)   dexmedetomidine  (PRECEDEX ) IV infusion Stopped (06/14/23 1615)   DOBUTamine  Stopped (06/14/23 1246)   lactated ringers      lactated ringers  20 mL/hr at 06/15/23 0800   nitroGLYCERIN  Stopped (06/14/23 1830)   norepinephrine  (LEVOPHED ) Adult infusion Stopped (06/14/23 1547)     Resolved Hospital Problem list    Assessment & Plan:  Multivessel CAD s/p CABG x1 LIMA to LAD Post bypass vasoplegia Acute postoperative respiratory insufficiency Expected post-operative ABLA Expected post-operative consumptive thrombocytopenia  HLD  - Wean cleviprex  - Increase beta blocker, start amlodipine  - Start high dose statin - Further GDMT per TCTS - De-lining per [facility] - Increase mobility, multimodal pain strategy, encourage IS and braced coughing - Insulin  gtt to SSI - Advance diet as tolerated - Will follow while in ICU  32 min cc time Rolan Sharps MD PCCM

## 2023-06-15 NOTE — Progress Notes (Signed)
 Patient ID: Isaac [name], male   DOB: 01-Feb-1946, 78 y.o.   MRN: [id number]  T[facility] Evening Rounds:  Hemodynamically stable, CI 2.6.  Sats 94% 8L HFNC  UO good. Replace K+ CT output low.  BMET    Component Value Date/Time   NA 132 (L) 06/15/2023 1600   NA 139 05/22/2023 1058   K 3.7 06/15/2023 1600   CL 103 06/15/2023 1600   CO2 21 (L) 06/15/2023 1600   GLUCOSE 128 (H) 06/15/2023 1600   BUN 15 06/15/2023 1600   BUN 24 05/22/2023 1058   CREATININE 0.74 06/15/2023 1600   CALCIUM  8.3 (L) 06/15/2023 1600   EGFR 89 05/22/2023 1058   GFRNONAA >60 06/15/2023 1600   CBC    Component Value Date/Time   WBC 11.3 (H) 06/15/2023 1600   RBC 4.20 (L) 06/15/2023 1600   HGB 12.8 (L) 06/15/2023 1600   HGB 14.3 05/22/2023 1058   HCT 37.1 (L) 06/15/2023 1600   HCT 43.2 05/22/2023 1058   PLT 252 06/15/2023 1600   PLT 253 05/22/2023 1058   MCV 88.3 06/15/2023 1600   MCV 90 05/22/2023 1058   MCH 30.5 06/15/2023 1600   MCHC 34.5 06/15/2023 1600   RDW 13.3 06/15/2023 1600   RDW 12.9 05/22/2023 1058   LYMPHSABS 2.2 03/10/2013 1506   MONOABS 0.5 03/10/2013 1506   EOSABS 0.2 03/10/2013 1506   BASOSABS 0.0 03/10/2013 1506

## 2023-06-16 ENCOUNTER — Inpatient Hospital Stay (HOSPITAL_COMMUNITY): Payer: PPO

## 2023-06-16 LAB — GLUCOSE, CAPILLARY
Glucose-Capillary: 105 mg/dL — ABNORMAL HIGH (ref 70–99)
Glucose-Capillary: 118 mg/dL — ABNORMAL HIGH (ref 70–99)
Glucose-Capillary: 123 mg/dL — ABNORMAL HIGH (ref 70–99)

## 2023-06-16 MED ORDER — POLYETHYLENE GLYCOL 3350 17 G PO PACK
17.0000 g | PACK | Freq: Every day | ORAL | Status: DC
Start: 2023-06-16 — End: 2023-06-17
  Administered 2023-06-16: 17 g via ORAL
  Filled 2023-06-16: qty 1

## 2023-06-16 MED ORDER — POLYVINYL ALCOHOL 1.4 % OP SOLN
1.0000 [drp] | OPHTHALMIC | Status: DC | PRN
  Administered 2023-06-16: 1 [drp] via OPHTHALMIC
  Filled 2023-06-16: qty 15

## 2023-06-16 MED ORDER — LACTULOSE 10 GM/15ML PO SOLN
10.0000 g | Freq: Every day | ORAL | Status: DC | PRN
  Administered 2023-06-16: 10 g via ORAL
  Filled 2023-06-16: qty 15

## 2023-06-16 MED ORDER — FUROSEMIDE 10 MG/ML IJ SOLN
40.0000 mg | Freq: Two times a day (BID) | INTRAMUSCULAR | Status: AC
  Administered 2023-06-16 (×2): 40 mg via INTRAVENOUS
  Filled 2023-06-16 (×2): qty 4

## 2023-06-16 MED ORDER — DIPHENHYDRAMINE HCL 25 MG PO CAPS: 25.0000 mg | ORAL_CAPSULE | Freq: Every evening | ORAL | Status: DC | PRN

## 2023-06-16 MED ORDER — POTASSIUM CHLORIDE CRYS ER 20 MEQ PO TBCR
20.0000 meq | EXTENDED_RELEASE_TABLET | Freq: Three times a day (TID) | ORAL | Status: AC
  Administered 2023-06-16 (×3): 20 meq via ORAL
  Filled 2023-06-16 (×3): qty 1

## 2023-06-16 MED ORDER — KETOROLAC TROMETHAMINE 15 MG/ML IJ SOLN
15.0000 mg | Freq: Four times a day (QID) | INTRAMUSCULAR | Status: DC | PRN
  Administered 2023-06-16 (×2): 15 mg via INTRAVENOUS
  Filled 2023-06-16 (×3): qty 1

## 2023-06-16 NOTE — Progress Notes (Signed)
 06/16/2023 Labs, notes, imaging reviewed.  Doing well, consider additional diuresis.  Will check on peripherally through weekend as needed and see again formally Monday.  Please reach out if any issues in interim.  Myrla Halsted MD PCCM

## 2023-06-16 NOTE — Progress Notes (Signed)
 2 Days Post-Op Procedure(s) (LRB): TRANSESOPHAGEAL ECHOCARDIOGRAM (TEE) (N/A) OFF PUMP CORONARY ARTERY BYPASS GRAFTING X 1, USING LEFT INTERNAL MAMMARY ARTERY (N/A) Subjective:  Had some hallucinations probably related to narcotics.  Wants to get his bowels moving. Eating well now that he has regular food.  Not sleeping. Wants Benadryl .  Objective: Vital signs in last 24 hours: Temp:  [98.3 F (36.8 C)-98.9 F (37.2 C)] 98.9 F (37.2 C) (01/11 0348) Pulse Rate:  [70-90] 77 (01/11 0700) Cardiac Rhythm: Normal sinus rhythm (01/10 1927) Resp:  [15-25] 17 (01/11 0700) BP: (119-162)/(65-85) 126/79 (01/11 0700) SpO2:  [91 %-97 %] 97 % (01/11 0700) Arterial Line BP: (122-149)/(54-76) 149/76 (01/10 1700) Weight:  [103.7 kg] 103.7 kg (01/11 0500)  Hemodynamic parameters for last 24 hours: CVP:  [4 mmHg-16 mmHg] 9 mmHg CO:  [5 L/min-7 L/min] 5.6 L/min CI:  [2.3 L/min/m2-3.2 L/min/m2] 2.6 L/min/m2  Intake/Output from previous day: 01/10 0701 - 01/11 0700 In: 1744.7 [P.O.:1480; I.V.:164.7; IV Piggyback:100] Out: 3250 [Urine:3110; Chest Tube:140] Intake/Output this shift: Total I/O In: -  Out: 1050 [Urine:1050]  General appearance: alert and cooperative Neurologic: intact Heart: regular rate and rhythm Lungs: clear to auscultation bilaterally Extremities: edema mild Wound: dressing dry  Lab Results: Recent Labs    06/15/23 0509 06/15/23 1600  WBC 13.2* 11.3*  HGB 13.1 12.8*  HCT 38.2* 37.1*  PLT 249 252   BMET:  Recent Labs    06/15/23 0509 06/15/23 1600  NA 135 132*  K 4.2 3.7  CL 106 103  CO2 20* 21*  GLUCOSE 119* 128*  BUN 16 15  CREATININE 0.69 0.74  CALCIUM  8.3* 8.3*    PT/INR:  Recent Labs    06/14/23 1225  LABPROT 17.1*  INR 1.4*   ABG    Component Value Date/Time   PHART 7.321 (L) 06/14/2023 1703   HCO3 21.7 06/14/2023 1703   TCO2 23 06/14/2023 1703   ACIDBASEDEF 4.0 (H) 06/14/2023 1703   O2SAT 95 06/14/2023 1703   CBG (last 3)  Recent  Labs    06/15/23 1305 06/15/23 1612 06/16/23 0759  GLUCAP 98 130* 105*   CXR: small left pleural effusion and left base atelectasis. Pulm vascular congestion.  Assessment/Plan: S/P Procedure(s) (LRB): TRANSESOPHAGEAL ECHOCARDIOGRAM (TEE) (N/A) OFF PUMP CORONARY ARTERY BYPASS GRAFTING X 1, USING LEFT INTERNAL MAMMARY ARTERY (N/A)  POD 2 Hemodynamically stable. Continue Lopressor .  DC pacing wires, then chest tubes and central line.  Wt is 7 lbs over preop. Start diuresis.  DC narcotics and use Toradol  to prevent further hallucinations.  DC CBG's  IS, ambulation.   LOS: 2 days    Isaac [name] 06/16/2023

## 2023-06-16 NOTE — Plan of Care (Signed)
  Problem: Education: Goal: Knowledge of General Education information will improve Description: Including pain rating scale, medication(s)/side effects and non-pharmacologic comfort measures Outcome: Progressing   Problem: Health Behavior/Discharge Planning: Goal: Ability to manage health-related needs will improve Outcome: Progressing   Problem: Clinical Measurements: Goal: Ability to maintain clinical measurements within normal limits will improve Outcome: Progressing Goal: Will remain free from infection Outcome: Progressing Goal: Diagnostic test results will improve Outcome: Progressing Goal: Respiratory complications will improve Outcome: Progressing Goal: Cardiovascular complication will be avoided Outcome: Progressing   Problem: Activity: Goal: Risk for activity intolerance will decrease Outcome: Progressing   Problem: Nutrition: Goal: Adequate nutrition will be maintained Outcome: Progressing   Problem: Coping: Goal: Level of anxiety will decrease Outcome: Progressing   Problem: Elimination: Goal: Will not experience complications related to bowel motility Outcome: Progressing Goal: Will not experience complications related to urinary retention Outcome: Progressing   Problem: Pain Management: Goal: General experience of comfort will improve Outcome: Progressing   Problem: Safety: Goal: Ability to remain free from injury will improve Outcome: Progressing   Problem: Skin Integrity: Goal: Risk for impaired skin integrity will decrease Outcome: Progressing   Problem: Education: Goal: Will demonstrate proper wound care and an understanding of methods to prevent future damage Outcome: Progressing Goal: Knowledge of disease or condition will improve Outcome: Progressing Goal: Knowledge of the prescribed therapeutic regimen will improve Outcome: Progressing Goal: Individualized Educational Video(s) Outcome: Progressing   Problem: Activity: Goal: Risk for  activity intolerance will decrease Outcome: Progressing   Problem: Cardiac: Goal: Will achieve and/or maintain hemodynamic stability Outcome: Progressing   Problem: Clinical Measurements: Goal: Postoperative complications will be avoided or minimized Outcome: Progressing   Problem: Respiratory: Goal: Respiratory status will improve Outcome: Progressing   Problem: Skin Integrity: Goal: Wound healing without signs and symptoms of infection Outcome: Progressing Goal: Risk for impaired skin integrity will decrease Outcome: Progressing   Problem: Urinary Elimination: Goal: Ability to achieve and maintain adequate renal perfusion and functioning will improve Outcome: Progressing   Problem: Education: Goal: Ability to describe self-care measures that may prevent or decrease complications (Diabetes Survival Skills Education) will improve Outcome: Progressing Goal: Individualized Educational Video(s) Outcome: Progressing   Problem: Coping: Goal: Ability to adjust to condition or change in health will improve Outcome: Progressing   Problem: Fluid Volume: Goal: Ability to maintain a balanced intake and output will improve Outcome: Progressing   Problem: Health Behavior/Discharge Planning: Goal: Ability to identify and utilize available resources and services will improve Outcome: Progressing Goal: Ability to manage health-related needs will improve Outcome: Progressing   Problem: Metabolic: Goal: Ability to maintain appropriate glucose levels will improve Outcome: Progressing   Problem: Nutritional: Goal: Maintenance of adequate nutrition will improve Outcome: Progressing Goal: Progress toward achieving an optimal weight will improve Outcome: Progressing   Problem: Skin Integrity: Goal: Risk for impaired skin integrity will decrease Outcome: Progressing   Problem: Tissue Perfusion: Goal: Adequacy of tissue perfusion will improve Outcome: Progressing

## 2023-06-16 NOTE — Progress Notes (Signed)
 Patient appears asleep, lightly snoring.

## 2023-06-16 NOTE — Plan of Care (Signed)
  Problem: Activity: Goal: Risk for activity intolerance will decrease Outcome: Progressing   Problem: Cardiac: Goal: Will achieve and/or maintain hemodynamic stability Outcome: Progressing   Problem: Respiratory: Goal: Respiratory status will improve Outcome: Progressing

## 2023-06-16 NOTE — Progress Notes (Signed)
 Patient ID: Isaac [name], male   DOB: 04-16-1946, 78 y.o.   MRN: [id number]  TCTS Evening Rounds:  Hemodynamically stable in NSR.  Diuresed well today. -1275 cc so far.

## 2023-06-16 NOTE — Progress Notes (Signed)
 Patient reports having "passed a lot of gas all night". Also reports very vivid dreams.  Patient states he has anxiety about taking narcotics.

## 2023-06-17 ENCOUNTER — Inpatient Hospital Stay (HOSPITAL_COMMUNITY): Payer: PPO

## 2023-06-17 LAB — GLUCOSE, CAPILLARY
Glucose-Capillary: 142 mg/dL — ABNORMAL HIGH (ref 70–99)
Glucose-Capillary: 97 mg/dL (ref 70–99)
Glucose-Capillary: 110 mg/dL — ABNORMAL HIGH (ref 70–99)
Glucose-Capillary: 105 mg/dL — ABNORMAL HIGH (ref 70–99)

## 2023-06-17 LAB — BASIC METABOLIC PANEL WITH GFR
Anion gap: 10 (ref 5–15)
BUN: 23 mg/dL (ref 8–23)
CO2: 23 mmol/L (ref 22–32)
Calcium: 8.8 mg/dL — ABNORMAL LOW (ref 8.9–10.3)
Chloride: 103 mmol/L (ref 98–111)
Creatinine, Ser: 0.91 mg/dL (ref 0.61–1.24)
GFR, Estimated: >60 mL/min
Glucose, Bld: 105 mg/dL — ABNORMAL HIGH (ref 70–99)
Potassium: 3.9 mmol/L (ref 3.5–5.1)
Sodium: 136 mmol/L (ref 135–145)

## 2023-06-17 LAB — CBC
HCT: 42.2 % (ref 39.0–52.0)
Hemoglobin: 13.8 g/dL (ref 13.0–17.0)
MCH: 29.9 pg (ref 26.0–34.0)
MCHC: 32.7 g/dL (ref 30.0–36.0)
MCV: 91.5 fL (ref 80.0–100.0)
Platelets: 225 10*3/uL (ref 150–400)
RBC: 4.61 MIL/uL (ref 4.22–5.81)
RDW: 13.3 % (ref 11.5–15.5)
WBC: 9.1 10*3/uL (ref 4.0–10.5)
nRBC: 0 % (ref 0.0–0.2)

## 2023-06-17 LAB — MAGNESIUM: Magnesium: 2.3 mg/dL (ref 1.7–2.4)

## 2023-06-17 MED ORDER — POTASSIUM CHLORIDE CRYS ER 20 MEQ PO TBCR
40.0000 meq | EXTENDED_RELEASE_TABLET | Freq: Once | ORAL | Status: AC
  Administered 2023-06-17: 40 meq via ORAL
  Filled 2023-06-17: qty 2

## 2023-06-17 NOTE — Progress Notes (Signed)
 3 Days Post-Op Procedure(s) (LRB): TRANSESOPHAGEAL ECHOCARDIOGRAM (TEE) (N/A) OFF PUMP CORONARY ARTERY BYPASS GRAFTING X 1, USING LEFT INTERNAL MAMMARY ARTERY (N/A) Subjective:  No complaints.  Ambulating  well  Bowels moved yesterday.  Objective: Vital signs in last 24 hours: Temp:  [97.5 F (36.4 C)-98.4 F (36.9 C)] 98.3 F (36.8 C) (01/12 1600) Pulse Rate:  [73-98] 96 (01/12 1400) Cardiac Rhythm: Normal sinus rhythm (01/12 1200) Resp:  [16-24] 19 (01/12 1400) BP: (94-134)/(58-91) 94/61 (01/12 1400) SpO2:  [89 %-97 %] 95 % (01/12 1400) Weight:  [98.8 kg] 98.8 kg (01/12 0500)  Hemodynamic parameters for last 24 hours:    Intake/Output from previous day: 01/11 0701 - 01/12 0700 In: 290 [P.O.:290] Out: 1325 [Urine:1325] Intake/Output this shift: Total I/O In: -  Out: 250 [Urine:250]  General appearance: alert and cooperative Neurologic: intact Heart: regular rate and rhythm Lungs: clear to auscultation bilaterally Extremities:no edema Wound: incision healing well.  Lab Results: Recent Labs    06/15/23 1600 06/17/23 0329  WBC 11.3* 9.1  HGB 12.8* 13.8  HCT 37.1* 42.2  PLT 252 225   BMET:  Recent Labs    06/15/23 1600 06/17/23 0329  NA 132* 136  K 3.7 3.9  CL 103 103  CO2 21* 23  GLUCOSE 128* 105*  BUN 15 23  CREATININE 0.74 0.91  CALCIUM  8.3* 8.8*    PT/INR: No results for input(s): LABPROT, INR in the last 72 hours. ABG    Component Value Date/Time   PHART 7.321 (L) 06/14/2023 1703   HCO3 21.7 06/14/2023 1703   TCO2 23 06/14/2023 1703   ACIDBASEDEF 4.0 (H) 06/14/2023 1703   O2SAT 95 06/14/2023 1703   CBG (last 3)  Recent Labs    06/17/23 0758 06/17/23 1126 06/17/23 1535  GLUCAP 142* 97 110*   CXR: clear  Assessment/Plan: S/P Procedure(s) (LRB): TRANSESOPHAGEAL ECHOCARDIOGRAM (TEE) (N/A) OFF PUMP CORONARY ARTERY BYPASS GRAFTING X 1, USING LEFT INTERNAL MAMMARY ARTERY (N/A)  POD 3 Hemodynamically stable in sinus rhythm.  Continue Lopressor .  -1L yesterday. Wt is at preop.  Continue IS, ambulation.  Plan transfer to 4E tomorrow.  LOS: 3 days    Isaac [name] 06/17/2023

## 2023-06-17 NOTE — Progress Notes (Signed)
 06/17/2023 Labs/imaging reviewed Progressing well. Will see again formally formally tomorrow if still in unit  Myrla Halsted MD PCCM

## 2023-06-18 LAB — CBC
HCT: 40.8 % (ref 39.0–52.0)
Hemoglobin: 13.9 g/dL (ref 13.0–17.0)
MCH: 29.7 pg (ref 26.0–34.0)
MCHC: 34.1 g/dL (ref 30.0–36.0)
MCV: 87.2 fL (ref 80.0–100.0)
Platelets: 258 10*3/uL (ref 150–400)
RBC: 4.68 MIL/uL (ref 4.22–5.81)
RDW: 13.3 % (ref 11.5–15.5)
WBC: 7.8 10*3/uL (ref 4.0–10.5)
nRBC: 0 % (ref 0.0–0.2)

## 2023-06-18 LAB — BASIC METABOLIC PANEL
Anion gap: 9 (ref 5–15)
BUN: 26 mg/dL — ABNORMAL HIGH (ref 8–23)
CO2: 23 mmol/L (ref 22–32)
Calcium: 8.7 mg/dL — ABNORMAL LOW (ref 8.9–10.3)
Chloride: 103 mmol/L (ref 98–111)
Creatinine, Ser: 0.76 mg/dL (ref 0.61–1.24)
GFR, Estimated: >60 mL/min (ref >60)
Glucose, Bld: 104 mg/dL — ABNORMAL HIGH (ref 70–99)
Potassium: 4.1 mmol/L (ref 3.5–5.1)
Sodium: 135 mmol/L (ref 135–145)

## 2023-06-18 LAB — GLUCOSE, CAPILLARY
Glucose-Capillary: 104 mg/dL — ABNORMAL HIGH (ref 70–99)
Glucose-Capillary: 114 mg/dL — ABNORMAL HIGH (ref 70–99)
Glucose-Capillary: 111 mg/dL — ABNORMAL HIGH (ref 70–99)
Glucose-Capillary: 111 mg/dL — ABNORMAL HIGH (ref 70–99)

## 2023-06-18 LAB — MAGNESIUM: Magnesium: 2.2 mg/dL (ref 1.7–2.4)

## 2023-06-18 LAB — PHOSPHORUS: Phosphorus: 3.4 mg/dL (ref 2.5–4.6)

## 2023-06-18 MED ORDER — SODIUM CHLORIDE 0.9% FLUSH: 3.0000 mL | INTRAVENOUS | Status: DC | PRN

## 2023-06-18 MED ORDER — SODIUM CHLORIDE 0.9% FLUSH: 3.0000 mL | Freq: Two times a day (BID) | INTRAVENOUS | Status: DC

## 2023-06-18 MED ORDER — ~~LOC~~ CARDIAC SURGERY, PATIENT & FAMILY EDUCATION: Freq: Once | Status: AC

## 2023-06-18 MED ORDER — SODIUM CHLORIDE 0.9 % IV SOLN
250.0000 mL | INTRAVENOUS | Status: DC | PRN
Start: 2023-06-18 — End: 2023-06-18

## 2023-06-18 NOTE — TOC Initial Note (Addendum)
 Transition of Care [facility]) - Initial/Assessment Note    Patient Details  Name: Isaac [name] MRN: [id number] Date of Birth: 1945-11-02  [facility] of [facility]) CM/SW Contact:    Justina Delcia Czar, RN Phone Number: [phone] 06/18/2023, 4:09 PM  Clinical Narrative:                 CM spoke to pt at bedside. States he lives in home with his brother. His dtr will be available to assist with care as needed. Pt states no DME is needed. Offered choice for HH (medicare.gov list provided and placed on chart). Pt preoperatively set up with [facility].  Will continue to follow for dc needs.   Expected Discharge Plan: Home/Self Care Barriers to Discharge: Continued Medical Work up   Patient Goals and CMS Choice Patient states their goals for this hospitalization and ongoing recovery are:: wants to recover          Expected Discharge Plan and Services   Discharge Planning Services: CM Consult   Living arrangements for the past 2 months: Single Family Home                                      Prior Living Arrangements/Services Living arrangements for the past 2 months: Single Family Home Lives with:: Siblings Patient language and need for interpreter reviewed:: Yes Do you feel safe going back to the place where you live?: Yes      Need for Family Participation in Patient Care: No (Comment) Care giver support system in place?: No (comment)   Criminal Activity/Legal Involvement Pertinent to Current Situation/Hospitalization: No - Comment as needed  Activities of Daily Living   ADL Screening (condition at time of admission) Independently performs ADLs?: Yes (appropriate for developmental age) Is the patient deaf or have difficulty hearing?: No Does the patient have difficulty seeing, even when wearing glasses/contacts?: No Does the patient have difficulty concentrating, remembering, or making decisions?: No  Permission Sought/Granted Permission sought to  share information with : Case Manager, Family Supports, PCP Permission granted to share information with : Yes, Verbal Permission Granted  Share Information with NAME: Mubarak Bevens     Permission granted to share info w Relationship: daughter  Permission granted to share info w Contact Information: [phone]  Emotional Assessment Appearance:: Appears stated age Attitude/Demeanor/Rapport: Engaged Affect (typically observed): Accepting Orientation: : Oriented to Self, Oriented to Place, Oriented to  Time, Oriented to Situation   Psych Involvement: No (comment)  Admission diagnosis:  Coronary artery disease [I25.10] Patient Active Problem List   Diagnosis Date Noted   Coronary artery disease 06/14/2023   Abnormal EKG 04/27/2023   Chest pain on exertion 04/27/2023   Precordial pain 04/27/2023   Aortic atherosclerosis (HCC) 04/27/2023   Hyperlipidemia 04/27/2023   Angina pectoris (HCC) 04/27/2023   Systolic murmur 04/27/2023   Hypertension 04/27/2023   PCP:  System, Provider Not In Pharmacy:   [id number] [place], [place] - [address] AT [facility] [address] & [facility] [address] [place] [address] Phone: [phone] Fax: [phone]     Social Drivers of Health (SDOH) Social History: SDOH Screenings   Food Insecurity: No Food Insecurity (06/14/2023)  Housing: Low Risk  (06/14/2023)  Transportation Needs: No Transportation Needs (06/14/2023)  Utilities: Not At Risk (06/14/2023)  Social Connections: Unknown (06/14/2023)  Tobacco Use: Medium Risk (06/14/2023)  SDOH Interventions:     Readmission Risk Interventions     No data to display

## 2023-06-18 NOTE — Plan of Care (Signed)
   Problem: Education: Goal: Knowledge of General Education information will improve Description Including pain rating scale, medication(s)/side effects and non-pharmacologic comfort measures Outcome: Progressing

## 2023-06-18 NOTE — Plan of Care (Signed)
  Problem: Education: Goal: Knowledge of General Education information will improve Description: Including pain rating scale, medication(s)/side effects and non-pharmacologic comfort measures Outcome: Progressing   Problem: Clinical Measurements: Goal: Ability to maintain clinical measurements within normal limits will improve Outcome: Progressing Goal: Respiratory complications will improve Outcome: Progressing Goal: Cardiovascular complication will be avoided Outcome: Progressing   Problem: Activity: Goal: Risk for activity intolerance will decrease Outcome: Progressing   Problem: Nutrition: Goal: Adequate nutrition will be maintained Outcome: Progressing   Problem: Coping: Goal: Level of anxiety will decrease Outcome: Progressing   

## 2023-06-18 NOTE — Progress Notes (Signed)
 Patient arrived from [facility].  Patient was able to ambulate with standby assistance in the room.  VSS.  No complaints.    06/18/23 1600  Vitals  Temp 97.9 F (36.6 C)  Temp Source Oral  BP 121/71  MAP (mmHg) 83  BP Location Left Arm  BP Method Automatic  Patient Position (if appropriate) Lying  Pulse Rate 99  Pulse Rate Source Monitor  Resp 20  Level of Consciousness  Level of Consciousness Alert  MEWS COLOR  MEWS Score Color Green  Oxygen Therapy  SpO2 99 %  O2 Device Room Air  Pain Assessment  Pain Scale 0-10  Pain Score 0  MEWS Score  MEWS Temp 0  MEWS Systolic 0  MEWS Pulse 0  MEWS RR 0  MEWS LOC 0  MEWS Score 0

## 2023-06-18 NOTE — Progress Notes (Signed)
 CARDIAC REHAB PHASE I   PRE:  Rate/Rhythm: 93 SR    BP: sitting 121/60    SpO2: 97 RA  MODE:  Ambulation: 370 ft   POST:  Rate/Rhythm: 104 ST    BP: sitting 81/57, recheck 127/69     SpO2: 100 RA  Pt stood independently, walked to BR, had small BM. Able to stand independently and walk in hall with contact guard. Fatigue with distance but overall did well. Return to recliner. Has RW at home if needed.   Began education with pt, discussed sternal precautions, however pt needed to urgently have BM. Left materials for pt to begin reviewing (OHS booklet, exercise, diet, CRPII). Pts daughter will be with him at d/c.  [id number]   Aliene Aris BS, ACSM-CEP 06/18/2023 10:59 AM

## 2023-06-18 NOTE — Progress Notes (Signed)
      [address]       [place] [address]             [phone]                 4 Days Post-Op Procedure(s) (LRB): TRANSESOPHAGEAL ECHOCARDIOGRAM (TEE) (N/A) OFF PUMP CORONARY ARTERY BYPASS GRAFTING X 1, USING LEFT INTERNAL MAMMARY ARTERY (N/A)   Events: No events _______________________________________________________________ Vitals: BP 122/75   Pulse 100   Temp 97.7 F (36.5 C) (Oral)   Resp 19   Ht 5' 10 (1.778 m)   Wt 97.8 kg   SpO2 96%   BMI 30.94 kg/m  Filed Weights   06/16/23 0500 06/17/23 0500 06/18/23 0500  Weight: 103.7 kg 98.8 kg 97.8 kg     - Neuro: alert NAD  - Cardiovascular: sinus  Drips: none.      - Pulm: EWOB    ABG    Component Value Date/Time   PHART 7.321 (L) 06/14/2023 1703   PCO2ART 41.9 06/14/2023 1703   PO2ART 81 (L) 06/14/2023 1703   HCO3 21.7 06/14/2023 1703   TCO2 23 06/14/2023 1703   ACIDBASEDEF 4.0 (H) 06/14/2023 1703   O2SAT 95 06/14/2023 1703    - Abd: ND - Extremity: warm  .Intake/Output      01/12 0701 01/13 0700 01/13 0701 01/14 0700   P.O.     Total Intake(mL/kg)     Urine (mL/kg/hr) 252 (0.1)    Stool 0    Total Output 252    Net -252         Urine Occurrence 5 x    Stool Occurrence 3 x       _______________________________________________________________ Labs:    Latest Ref Rng & Units 06/18/2023    2:56 AM 06/17/2023    3:29 AM 06/15/2023    4:00 PM  CBC  WBC 4.0 - 10.5 K/uL 7.8  9.1  11.3   Hemoglobin 13.0 - 17.0 g/dL 86.0  86.1  87.1   Hematocrit 39.0 - 52.0 % 40.8  42.2  37.1   Platelets 150 - 400 K/uL 258  225  252       Latest Ref Rng & Units 06/18/2023    2:56 AM 06/17/2023    3:29 AM 06/15/2023    4:00 PM  CMP  Glucose 70 - 99 mg/dL 895  894  871   BUN 8 - 23 mg/dL 26  23  15    Creatinine 0.61 - 1.24 mg/dL 9.23  9.08  9.25   Sodium 135 - 145 mmol/L 135  136  132   Potassium 3.5 - 5.1 mmol/L 4.1  3.9  3.7   Chloride 98 - 111 mmol/L 103  103  103   CO2 22 - 32  mmol/L 23  23  21    Calcium  8.9 - 10.3 mg/dL 8.7  8.8  8.3     CXR: -  _______________________________________________________________  Assessment and Plan: POD 4 s/p CABG  Neuro: pain controlled CV: on A/S/BB Pulm: is, ambulation Renal: creat stable GI: on diet Heme: stable ID: [email or web address] Endo: SSI Dispo: floor   Isaac [name] O Isaac [name] 06/18/2023 8:16 AM

## 2023-06-19 LAB — CBC
HCT: 38.9 % — ABNORMAL LOW (ref 39.0–52.0)
Hemoglobin: 13.4 g/dL (ref 13.0–17.0)
MCH: 29.6 pg (ref 26.0–34.0)
MCHC: 34.4 g/dL (ref 30.0–36.0)
MCV: 86.1 fL (ref 80.0–100.0)
Platelets: 285 10*3/uL (ref 150–400)
RBC: 4.52 MIL/uL (ref 4.22–5.81)
RDW: 13.2 % (ref 11.5–15.5)
WBC: 8.6 10*3/uL (ref 4.0–10.5)
nRBC: 0 % (ref 0.0–0.2)

## 2023-06-19 LAB — GLUCOSE, CAPILLARY
Glucose-Capillary: 105 mg/dL — ABNORMAL HIGH (ref 70–99)
Glucose-Capillary: 106 mg/dL — ABNORMAL HIGH (ref 70–99)
Glucose-Capillary: 126 mg/dL — ABNORMAL HIGH (ref 70–99)
Glucose-Capillary: 127 mg/dL — ABNORMAL HIGH (ref 70–99)

## 2023-06-19 LAB — BASIC METABOLIC PANEL
Anion gap: 12 (ref 5–15)
BUN: 21 mg/dL (ref 8–23)
CO2: 21 mmol/L — ABNORMAL LOW (ref 22–32)
Calcium: 8.7 mg/dL — ABNORMAL LOW (ref 8.9–10.3)
Chloride: 101 mmol/L (ref 98–111)
Creatinine, Ser: 0.76 mg/dL (ref 0.61–1.24)
GFR, Estimated: >60 mL/min (ref >60)
Glucose, Bld: 107 mg/dL — ABNORMAL HIGH (ref 70–99)
Potassium: 4.1 mmol/L (ref 3.5–5.1)
Sodium: 134 mmol/L — ABNORMAL LOW (ref 135–145)

## 2023-06-19 MED ORDER — METOPROLOL TARTRATE 25 MG PO TABS
37.5000 mg | ORAL_TABLET | Freq: Two times a day (BID) | ORAL | Status: DC
  Administered 2023-06-19: 37.5 mg via ORAL
  Filled 2023-06-19: qty 1

## 2023-06-19 NOTE — Progress Notes (Addendum)
      [address]       [place] [address]             [phone]        5 Days Post-Op Procedure(s) (LRB): TRANSESOPHAGEAL ECHOCARDIOGRAM (TEE) (N/A) OFF PUMP CORONARY ARTERY BYPASS GRAFTING X 1, USING LEFT INTERNAL MAMMARY ARTERY (N/A)  Subjective: Patient has had small bowel movement. He walked several times yesterday and wants to walk now that finished breakfast.  Objective: Vital signs in last 24 hours: Temp:  [97.6 F (36.4 C)-98.9 F (37.2 C)] 98.9 F (37.2 C) (01/14 0442) Pulse Rate:  [81-102] 99 (01/14 0442) Cardiac Rhythm: Normal sinus rhythm (01/13 2100) Resp:  [15-24] 20 (01/14 0604) BP: (102-121)/(60-88) 102/69 (01/14 0442) SpO2:  [93 %-100 %] 100 % (01/14 0442) Weight:  [97.5 kg] 97.5 kg (01/14 0604)  Pre op weight 100.2 kg Current Weight  06/19/23 97.5 kg      Intake/Output from previous day: 01/13 0701 - 01/14 0700 In: -  Out: 300 [Urine:300]   Physical Exam:  Cardiovascular: RRR, Pulmonary: Clear to auscultation bilaterally Abdomen: Soft, non tender, bowel sounds present. Extremities: Trace bilateral lower extremity edema. Wounds: Clean and dry.  No erythema or signs of infection.  Lab Results: CBC: Recent Labs    06/18/23 0256 06/19/23 0313  WBC 7.8 8.6  HGB 13.9 13.4  HCT 40.8 38.9*  PLT 258 285   BMET:  Recent Labs    06/18/23 0256 06/19/23 0313  NA 135 134*  K 4.1 4.1  CL 103 101  CO2 23 21*  GLUCOSE 104* 107*  BUN 26* 21  CREATININE 0.76 0.76  CALCIUM  8.7* 8.7*    PT/INR:  Lab Results  Component Value Date   INR 1.4 (H) 06/14/2023   INR 1.1 06/13/2023   INR 1.00 03/10/2013   ABG:  INR: Will add last result for INR, ABG once components are confirmed Will add last 4 CBG results once components are confirmed  Assessment/Plan:  1. CV - ST at times. On Amlodipine  5 mg daily, Lopressor  25 mg bid. Unable to titrate Lopressor  secondary to labile BP. Will stop Amlodipine  for now. 2.  Pulmonary -  On room air. Encourage incentive spirometer. 3.  Expected post op acute blood loss anemia - H and H this am 13.4 and 38.9. 4. CBGs 114/111/111. Pre op HGA1C 5.5. Stop accu checks and SS PRN. 5. Likely discharge in am;will notify daughter  Kyla HERO ZimmermanPA-C 7:07 AM   Agree with above Dispo planning  Dewanda Fennema MALVA Rayas

## 2023-06-19 NOTE — Plan of Care (Signed)

## 2023-06-19 NOTE — Progress Notes (Signed)
 CARDIAC REHAB PHASE I    Pt ambulated in hallway. Tolerated well with no CP, SOB or dizziness. Returned to chair with call bell and bedside table in reach. Post OHS education including site care, restrictions, heart healthy diet, sternal precautions, IS use at home, home needs at discharge, exercise guidelines and CRP2 reviewed. All questions and concerns addressed. Will refer to [facility] for CRP2.  [phone]  Vaughn Asberry Hacking, RN BSN 06/19/2023 10:36 AM

## 2023-06-19 NOTE — Plan of Care (Signed)

## 2023-06-20 ENCOUNTER — Other Ambulatory Visit: Payer: Self-pay | Admitting: Physician Assistant

## 2023-06-20 LAB — GLUCOSE, CAPILLARY: Glucose-Capillary: 107 mg/dL — ABNORMAL HIGH (ref 70–99)

## 2023-06-20 MED ORDER — ASPIRIN 325 MG PO TBEC: 325.0000 mg | DELAYED_RELEASE_TABLET | Freq: Every day | ORAL | Status: AC

## 2023-06-20 MED ORDER — TRAMADOL HCL 50 MG PO TABS: 50.0000 mg | ORAL_TABLET | Freq: Four times a day (QID) | ORAL | 0 refills | Status: AC | PRN

## 2023-06-20 MED ORDER — METOPROLOL TARTRATE 50 MG PO TABS: 50.0000 mg | ORAL_TABLET | Freq: Two times a day (BID) | ORAL | 1 refills | Status: DC

## 2023-06-20 MED ORDER — TRAMADOL HCL 50 MG PO TABS: 50.0000 mg | ORAL_TABLET | Freq: Four times a day (QID) | ORAL | Status: DC | PRN

## 2023-06-20 MED ORDER — METOPROLOL TARTRATE 50 MG PO TABS
50.0000 mg | ORAL_TABLET | Freq: Two times a day (BID) | ORAL | Status: DC
  Administered 2023-06-20: 50 mg via ORAL
  Filled 2023-06-20: qty 1

## 2023-06-20 MED ORDER — ATORVASTATIN CALCIUM 40 MG PO TABS: 40.0000 mg | ORAL_TABLET | Freq: Every day | ORAL | 1 refills | Status: DC

## 2023-06-20 NOTE — TOC Transition Note (Signed)
 Transition of Care [facility]) - Discharge Note Sherin Dingwall RN, BSN Transitions of Care Unit 4E- RN Case Manager See Treatment Team for direct phone #   Patient Details  Name: Isaac [name] MRN: [id number] Date of Birth: 02-11-1946  Transition of Care [facility]) CM/SW Contact:  Rox Cope, RN Phone Number: 06/20/2023, 10:06 AM   Clinical Narrative:    Pt stable for transition home today, HH has been set up with [facility] per TCTS office referral- liaison updated this am on discharge for start of care.   No DME needs noted.  Family to transport home.   No further TOC needs noted.    Final next level of care: Home w Home Health Services Barriers to Discharge: Barriers Resolved   Patient Goals and CMS Choice Patient states their goals for this hospitalization and ongoing recovery are:: wants to recover          Discharge Placement                 Home w/ [facility]      Discharge Plan and Services Additional resources added to the After Visit Summary for     Discharge Planning Services: CM Consult Post Acute Care Choice: Home Health          DME Arranged: N/A DME Agency: NA       HH Arranged: RN, PT HH Agency: [facility] Date HH Agency Contacted: 06/20/23 Time HH Agency Contacted: 1006 Representative spoke with at [facility] Agency: Glenora Laos  Social Drivers of Health (SDOH) Interventions SDOH Screenings   Food Insecurity: No Food Insecurity (06/14/2023)  Housing: Low Risk  (06/14/2023)  Transportation Needs: No Transportation Needs (06/14/2023)  Utilities: Not At Risk (06/14/2023)  Social Connections: Unknown (06/14/2023)  Tobacco Use: Medium Risk (06/14/2023)     Readmission Risk Interventions    06/20/2023   10:06 AM  Readmission Risk Prevention Plan  Post Dischage Appt Complete  Medication Screening Complete  Transportation Screening Complete

## 2023-06-20 NOTE — Progress Notes (Addendum)
      [address]       [place] [address]             [phone]        6 Days Post-Op Procedure(s) (LRB): TRANSESOPHAGEAL ECHOCARDIOGRAM (TEE) (N/A) OFF PUMP CORONARY ARTERY BYPASS GRAFTING X 1, USING LEFT INTERNAL MAMMARY ARTERY (N/A)  Subjective: Patient sitting in chair without complaints this am. He wants to go home  Objective: Vital signs in last 24 hours: Temp:  [97.4 F (36.3 C)-98.3 F (36.8 C)] 98.3 F (36.8 C) (01/15 0408) Pulse Rate:  [91-106] 100 (01/15 0408) Cardiac Rhythm: Sinus tachycardia;Bundle branch block (01/14 1900) Resp:  [13-22] 22 (01/15 0408) BP: (110-127)/(66-91) 125/77 (01/15 0408) SpO2:  [94 %-99 %] 94 % (01/15 0408) Weight:  [96.3 kg] 96.3 kg (01/15 0405)  Pre op weight 100.2 kg Current Weight  06/20/23 96.3 kg      Intake/Output from previous day: 01/14 0701 - 01/15 0700 In: 240 [P.O.:240] Out: -    Physical Exam:  Cardiovascular: Tachycardic Pulmonary: Clear to auscultation bilaterally Abdomen: Soft, non tender, bowel sounds present. Extremities: No lower extremity edema. Wounds: Clean and dry.  No erythema or signs of infection.  Lab Results: CBC: Recent Labs    06/18/23 0256 06/19/23 0313  WBC 7.8 8.6  HGB 13.9 13.4  HCT 40.8 38.9*  PLT 258 285   BMET:  Recent Labs    06/18/23 0256 06/19/23 0313  NA 135 134*  K 4.1 4.1  CL 103 101  CO2 23 21*  GLUCOSE 104* 107*  BUN 26* 21  CREATININE 0.76 0.76  CALCIUM  8.7* 8.7*    PT/INR:  Lab Results  Component Value Date   INR 1.4 (H) 06/14/2023   INR 1.1 06/13/2023   INR 1.00 03/10/2013   ABG:  INR: Will add last result for INR, ABG once components are confirmed Will add last 4 CBG results once components are confirmed  Assessment/Plan:  1. CV - Still ST at times. On Lopressor  37.5 mg bid so will increase to 50 mg bid. 2.  Pulmonary - On room air. Encourage incentive spirometer. 3.  Expected post op acute blood loss anemia - Last H and  H 13.4 and 38.9. 4. Discharge;I spoke with daughter yesterday about plans to discharge today  Donielle M ZimmermanPA-C 7:04 AM  Agree with above Home today  Jaydan Chretien Ala Alice

## 2023-06-20 NOTE — Progress Notes (Signed)
 Discharge instructions given. Patient verbalized understanding and all questions were answered.  ?

## 2023-06-20 NOTE — Care Management Important Message (Signed)
 Important Message  Patient Details  Name: CLARA VIEYRA MRN: [id number] Date of Birth: 02/04/1946   Important Message Given:  Yes - Medicare IM  Mailed the letter patient already D/C.   Vilinda Grays Smith 06/20/2023, 10:47 AM

## 2023-06-20 NOTE — Progress Notes (Signed)
 CARDIAC REHAB PHASE I    Pt ready for discharge home. Postop OHS education completed. Referral for CRP2 sent to [facility].  Ronny Colas, RN BSN 06/20/2023 10:45 AM

## 2023-06-21 DIAGNOSIS — Z48812 Encounter for surgical aftercare following surgery on the circulatory system: Secondary | ICD-10-CM | POA: Diagnosis not present

## 2023-06-22 ENCOUNTER — Ambulatory Visit: Payer: PPO | Admitting: Internal Medicine

## 2023-06-22 ENCOUNTER — Telehealth: Payer: Self-pay | Admitting: *Deleted

## 2023-06-22 NOTE — Telephone Encounter (Signed)
Patient's daughter, Fleet Contras, called stating patient is c/o left hand numbness. States left hand was cold throughout hospital stay and complained of hurting at discharge. Patient reports feeling numbness in hand yesterday. Advised daughter that per record an A-line was attempted on the left wrist 2x. Daughter states patient has good capillary refill and hand is currently warm. States there is swelling but that decreased with elevation. Advised to continue to monitor the situation and to call if worsens. Fleet Contras verbalized understanding.

## 2023-06-23 ENCOUNTER — Encounter: Payer: Self-pay | Admitting: Internal Medicine

## 2023-06-29 ENCOUNTER — Ambulatory Visit (INDEPENDENT_AMBULATORY_CARE_PROVIDER_SITE_OTHER): Payer: Self-pay | Admitting: Thoracic Surgery (Cardiothoracic Vascular Surgery)

## 2023-06-29 DIAGNOSIS — Z951 Presence of aortocoronary bypass graft: Secondary | ICD-10-CM

## 2023-06-29 NOTE — Progress Notes (Signed)
     [address]       [place] [address]             [phone]       Patient: Home Provider: Office Consent for Telemedicine visit obtained.  Today's visit was completed via a real-time telehealth (see specific modality noted below). The patient/authorized person provided oral consent at the time of the visit to engage in a telemedicine encounter with the present provider at [facility]. The patient/authorized person was informed of the potential benefits, limitations, and risks of telemedicine. The patient/authorized person expressed understanding that the laws that protect confidentiality also apply to telemedicine. The patient/authorized person acknowledged understanding that telemedicine does not provide emergency services and that he or she would need to call 911 or proceed to the nearest hospital for help if such a need arose.   Total time spent in the clinical discussion 10 minutes.  Telehealth Modality: Phone visit (audio only)  I had a telephone visit with  Isaac [name] who is s/p CABG.  Overall doing well.  Pain is minimal.  Ambulating well. Vitals have been stable.  Isaac [name] will see [place] back in 1 month with a chest x-ray for cardiac rehab clearance.  Vern Prestia Keane Scrape

## 2023-07-03 ENCOUNTER — Telehealth (HOSPITAL_COMMUNITY): Payer: Self-pay

## 2023-07-03 NOTE — Telephone Encounter (Signed)
Attempted to call patient in regards to Cardiac Rehab - LM on VM

## 2023-07-05 ENCOUNTER — Encounter: Payer: Self-pay | Admitting: Emergency Medicine

## 2023-07-05 ENCOUNTER — Ambulatory Visit: Payer: PPO | Attending: Physician Assistant | Admitting: Emergency Medicine

## 2023-07-05 VITALS — BP 114/68 | HR 77 | Ht 70.0 in | Wt 218.6 lb

## 2023-07-05 DIAGNOSIS — E785 Hyperlipidemia, unspecified: Secondary | ICD-10-CM

## 2023-07-05 DIAGNOSIS — I77810 Thoracic aortic ectasia: Secondary | ICD-10-CM

## 2023-07-05 DIAGNOSIS — I10 Essential (primary) hypertension: Secondary | ICD-10-CM | POA: Diagnosis not present

## 2023-07-05 DIAGNOSIS — I739 Peripheral vascular disease, unspecified: Secondary | ICD-10-CM

## 2023-07-05 DIAGNOSIS — I4510 Unspecified right bundle-branch block: Secondary | ICD-10-CM

## 2023-07-05 DIAGNOSIS — I700 Atherosclerosis of aorta: Secondary | ICD-10-CM

## 2023-07-05 DIAGNOSIS — I2510 Atherosclerotic heart disease of native coronary artery without angina pectoris: Secondary | ICD-10-CM | POA: Diagnosis not present

## 2023-07-05 NOTE — Progress Notes (Signed)
Cardiology Office Note:    Date:  07/05/2023  ID:  Isaac [name], DOB 1946/01/31, MRN [id number] PCP: Farris Has, MD  [facility] Providers Cardiologist:  Christell Constant, MD       Patient Profile:      Isaac [name] is a 78 y.o. male with visit-pertinent history of  RBBB, rare PVCs, aortic atherosclerosis, PAD, CAD s/p CABG x 1, hyperlipidemia, obesity, remote tobacco abuse  He established with cardiology service on 04/27/2023 for chest discomfort and fatigue on exertion.  Coronary CTA completed 05/21/2023 showing evidence of a CTO vessel (RCA).  He continued to have angina despite medical therapy so underwent cardiac catheterization.  Left heart catheterization on 06/01/2023 showed severe two-vessel CAD with severe ostial LAD stenosis, RCA has a CTA proximally and is well collateralized.  He was referred for CABG.  Echocardiogram 06/05/2023 showed LVEF 60 to 65%, no RWMA, RV SF normal, no valvular abnormalities, mild dilation of the ascending aorta measuring 39 mm.  He underwent CABG x 1 (LIMA to LAD) on 06/14/2023.  CT surgery was able to find any usable veins in either lower extremities due to varicosities.  Due to the fact that he had developed left-to-right collaterals and the PDA target was quite distal they elected not to harvest the RIMA for bypass.  Carotid duplex showed 1-39% BICA.  Right/left ABIs demonstrated noncompressible arteries.      History of Present Illness:  Discussed the use of AI scribe software for clinical note transcription with the patient, who gave verbal consent to proceed.  Isaac [name] is a 78 y.o. male who returns for follow-up post CABG.   He arrives to clinic today with his friend.  He is without any cardiovascular concerns or complaints today.  He reports feeling significantly better since the surgery, with improved exercise tolerance. He has been walking daily, even multiple times a day, which he was unable to do prior to the surgery.  He denies any chest pain, exertional angina, dyspnea but notes a mild soreness at the surgical site.  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  He has been working on his diet alongside his daughter.     Review of Systems  Constitutional: Negative for weight gain and weight loss.  Cardiovascular:  Negative for chest pain, claudication, dyspnea on exertion, irregular heartbeat, leg swelling, near-syncope, orthopnea, palpitations, paroxysmal nocturnal dyspnea and syncope.  Respiratory:  Negative for cough, hemoptysis and shortness of breath.   Gastrointestinal:  Negative for abdominal pain, hematochezia and melena.  Genitourinary:  Negative for hematuria.  Neurological:  Negative for dizziness and light-headedness.     See HPI     Home Medications:    Prior to Admission medications   Medication Sig Start Date End Date Taking? Authorizing Provider  aspirin EC 325 MG tablet Take 1 tablet (325 mg total) by mouth daily. 06/20/23   Ardelle Balls, PA-C  atorvastatin (LIPITOR) 40 MG tablet Take 1 tablet (40 mg total) by mouth daily. 06/20/23   Ardelle Balls, PA-C  Ibuprofen-diphenhydrAMINE HCl (ADVIL PM) 200-25 MG CAPS Take 2 tablets by mouth at bedtime.    [provider]  MAGNESIUM PO Take 1 tablet by mouth at bedtime.    [provider]  metoprolol tartrate (LOPRESSOR) 50 MG tablet Take 1 tablet (50 mg total) by mouth 2 (two) times daily. 06/20/23   Ardelle Balls,  PA-C  Multiple Vitamin (MULTI VITAMIN) TABS Take 1 tablet by mouth at bedtime.    [provider]  traMADol (ULTRAM) 50 MG tablet Take 1 tablet (50 mg total) by mouth every 6 (six) hours as needed for severe pain (pain score 7-10). 06/20/23   Ardelle Balls, PA-C  TURMERIC PO Take 1 tablet by mouth at bedtime.    [provider]   Studies Reviewed:   EKG Interpretation Date/Time:  Thursday  July 05 2023 08:02:12 EST Ventricular Rate:  77 PR Interval:  198 QRS Duration:  142 QT Interval:  414 QTC Calculation: 468 R Axis:   54  Text Interpretation: Normal sinus rhythm Right bundle branch block Confirmed by Rise Paganini [phone]) on 07/05/2023 8:53:56 AM    Coronary CTA 05/09/2023 1. Coronary calcium score of 675. This was 65th percentile for age, sex, and race matched control.   2. Normal coronary origin with Right dominance.   3. CAD-RADS 5 Total coronary occlusion (100%). Consider cardiac catheterization or viability assessment. CT-FFR will be sent. Consider symptom-guided anti-ischemic pharmacotherapy as well as risk factor modification per guideline directed care.  Left heart catheterization 06/01/2023 Severe 2 vessel CAD. There is a severe ostial LAD stenosis. The RCA has a CTO proximally and is well collateralized Normal LV function Normal LVEDP Diagnostic Dominance: Right  Echocardiogram 06/05/2023 1. Left ventricular ejection fraction, by estimation, is 60 to 65%. The  left ventricle has normal function. The left ventricle has no regional  wall motion abnormalities. Left ventricular diastolic parameters were  normal. GLS -22.8%.   2. Right ventricular systolic function is normal. The right ventricular  size is normal.   3. The mitral valve is normal in structure. No evidence of mitral valve  regurgitation. No evidence of mitral stenosis.   4. The aortic valve is normal in structure. There is moderate  calcification of the aortic valve. Aortic valve regurgitation is not  visualized. No aortic stenosis is present.   5. Aortic dilatation noted. There is mild dilatation of the ascending  aorta, measuring 39 mm.   6. The inferior vena cava is normal in size with greater than 50%  respiratory variability, suggesting right atrial pressure of 3 mmHg.  Risk Assessment/Calculations:             Physical Exam:   VS:  BP 114/68   Pulse 77   Ht 5\' 10"   (1.778 m)   Wt 218 lb 9.6 oz (99.2 kg)   SpO2 98%   BMI 31.37 kg/m    Wt Readings from Last 3 Encounters:  07/05/23 218 lb 9.6 oz (99.2 kg)  06/20/23 212 lb 4.9 oz (96.3 kg)  06/13/23 221 lb (100.2 kg)    Constitutional:      Appearance: Normal and healthy appearance. Not in distress.  Neck:     Vascular: JVD normal.  Pulmonary:     Effort: Pulmonary effort is normal.     Breath sounds: Normal breath sounds.  Chest:     Chest wall: Not tender to palpatation.  Cardiovascular:     PMI at left midclavicular line. Normal rate. Regular rhythm. Normal S1. Normal S2.      Murmurs: There is no murmur.     No gallop.  No click. No rub.     Comments: Sternal incision well-approximated without redness, swelling, bruising. Pulses:    Intact distal pulses.  Edema:    Peripheral edema absent.  Musculoskeletal: Normal range of motion.     Cervical  back: Normal range of motion and neck supple. Skin:    General: Skin is warm and dry.  Neurological:     General: No focal deficit present.     Mental Status: Alert, oriented to person, place, and time and oriented to person, place and time.  Psychiatric:        Mood and Affect: Mood and affect normal.        Behavior: Behavior is cooperative.        Thought Content: Thought content normal.        Assessment and Plan:  CAD  s/p CABG x1 / Aortic atherosclerosis  LHC 05/2023 with severe ostial LAD stenosis and RCA with CTO CABG x1 (LIMA to LAD) on 06/14/2023 CT surgery elected not to harvest the RIMA for bypass due to left-to-right collaterals to RCA and was able to find any usable veins in either lower extremities due to varicosities -Currently asymptomatic and stable with no anginal symptoms. Feels much improved and no longer experiencing dyspnea or chest pains. Currently walking twice a daily without intolerance. Surgical incision is well approximated  -Has been taking Advil PM daily for several years, recommend discontinuing and suggest  trying a non-NSAID sleep aid such as melatonin -Scheduled with CT surgery on 08/07/2023  -Continue Aspirin 325mg , Atorvastatin 40mg  daily, Metoprolol Tartrate 50mg  twice daily. We will defer to CVTS the duration of higher dose aspirin, anticipate long term use of 81mg  daily  Hyperlipidemia, LDL goal less than 55 LDL 89 on 05/2023.  Not at current goal however switch from pravastatin to atorvastatin in 06/2023. -Plan for lipid panel/LFTs x 2 months, can increase statin at that time if needed to reach goal -Continue Atorvastatin 40mg  daily   Ascending aorta dilation Echo 06/05/2023 with mild dilation of ascending aorta measuring 39 mm. -Monitor in 1 year with repeat echocardiogram or CTA chest.  Peripheral arterial disease  Reported to have had blockages in leg arteries noted on previous imaging, contributing to cardiovascular risk. ABIs 06/2023 demonstrated noncompressible arteries  -LDL goal < 55, he is asymptomatic w/o claudication   RBBB EKG today with stable chronic RBBB PVCs noted in history, noncontributory at this time         Cardiac Rehabilitation Eligibility Assessment  The patient is ready to start cardiac rehabilitation pending clearance from the cardiac surgeon.     Dispo:  Return in about 3 months (around 10/03/2023).  Signed, Denyce Sonu, NP

## 2023-07-05 NOTE — Patient Instructions (Signed)
Medication Instructions:  No changes  *If you need a refill on your cardiac medications before your next appointment, please call your pharmacy*   Lab Work: Fasting lipid in 2 months  If you have labs (blood work) drawn today and your tests are completely normal, you will receive your results only by: MyChart Message (if you have MyChart) OR A paper copy in the mail If you have any lab test that is abnormal or we need to change your treatment, we will call you to review the results.   Testing/Procedures: None    Follow-Up: At [facility], you and your health needs are our priority.  As part of our continuing mission to provide you with exceptional heart care, we have created designated Provider Care Teams.  These Care Teams include your primary Cardiologist (physician) and Advanced Practice Providers (APPs -  Physician Assistants and Nurse Practitioners) who all work together to provide you with the care you need, when you need it.      Your next appointment:   3 month(s)  Provider:   Christell Constant, MD     Other Instructions   1st Floor: - Lobby - Registration  - Pharmacy  - Lab - Cafe  2nd Floor: - PV Lab - Diagnostic Testing (echo, CT, nuclear med)  3rd Floor: - Vacant  4th Floor: - TCTS (cardiothoracic surgery) - AFib Clinic - Structural Heart Clinic - Vascular Surgery  - Vascular Ultrasound  5th Floor: - HeartCare Cardiology (general and EP) - Clinical Pharmacy for coumadin, hypertension, lipid, weight-loss medications, and med management appointments    Valet parking services will be available as well.

## 2023-07-31 ENCOUNTER — Ambulatory Visit: Payer: PPO | Admitting: Physician Assistant

## 2023-08-01 DIAGNOSIS — I2510 Atherosclerotic heart disease of native coronary artery without angina pectoris: Secondary | ICD-10-CM | POA: Diagnosis not present

## 2023-08-01 DIAGNOSIS — E785 Hyperlipidemia, unspecified: Secondary | ICD-10-CM | POA: Diagnosis not present

## 2023-08-02 LAB — LIPID PANEL
Chol/HDL Ratio: 2.6 ratio (ref 0.0–5.0)
Cholesterol, Total: 108 mg/dL (ref 100–199)
HDL: 41 mg/dL (ref >39)
LDL Chol Calc (NIH): 45 mg/dL (ref 0–99)
Triglycerides: 124 mg/dL (ref 0–149)
VLDL Cholesterol Cal: 22 mg/dL (ref 5–40)

## 2023-08-03 ENCOUNTER — Other Ambulatory Visit: Payer: Self-pay | Admitting: Thoracic Surgery (Cardiothoracic Vascular Surgery)

## 2023-08-03 DIAGNOSIS — Z951 Presence of aortocoronary bypass graft: Secondary | ICD-10-CM

## 2023-08-07 ENCOUNTER — Other Ambulatory Visit: Payer: Self-pay

## 2023-08-07 ENCOUNTER — Ambulatory Visit (INDEPENDENT_AMBULATORY_CARE_PROVIDER_SITE_OTHER): Payer: PPO | Admitting: Surgical

## 2023-08-07 ENCOUNTER — Ambulatory Visit
Admission: RE | Admit: 2023-08-07 | Discharge: 2023-08-07 | Disposition: A | Discharge status: Home / Self Care | Source: Ambulatory Visit | Attending: Thoracic Surgery (Cardiothoracic Vascular Surgery)

## 2023-08-07 VITALS — BP 107/62 | HR 67 | Resp 18 | Ht 70.0 in | Wt 222.0 lb

## 2023-08-07 DIAGNOSIS — Z951 Presence of aortocoronary bypass graft: Secondary | ICD-10-CM

## 2023-08-07 NOTE — Progress Notes (Signed)
 [address]       [place] [address]             [phone]      KYMARI NUON [facility] Medical Record #[id number] Date of Birth: 08-07-1945  Referring: Colvin Caroli, * Primary Care: Farris Has, MD Primary Cardiologist: Christell Constant, MD   Chief Complaint:   POST OP FOLLOW UP  06/14/2023    Patient:  [name] Pre-Op Dx: Arelia Longest CAD HTN HLP   Post-op Dx:  same Procedure: Off pump CABG X 1.  LIMA LAD         Surgeon and Role:      * Lightfoot, Eliezer Lofts, MD - Primary    * D.Zimmerman, PA-C - assisting   Anesthesia  general   Findings: We were unable to find any usable vein in either lower extremities due to the varicosities.  Due to the fact that he had developed left to right collaterals, and that the PDA target was quite distal, I elected not to harvest the RIMA for bypass   History of Present Illness:    Patient is a 78 year old male seen in the office on today's date and routine postsurgical follow-up status post the above described procedure.  He reports that he is doing quite well.  He is walking daily.  He has some occasional chest pressure but this appears to be minor in nature.  He has had no fevers, chills or other significant constitutional symptoms.  His incisions are healing well.  He is not having palpitations or lower extremity edema.  He started driving approximately a week ago and has not had any difficulties.  Overall he is very pleased with his ongoing progress.      Past Medical History:  Diagnosis Date   Abnormal fasting glucose    Acute right-sided low back pain with right-sided sciatica    Anginal pain (HCC)    Arthritis    R knee- DJD   BMI 35.0-35.9,adult    Complication of anesthesia    tachycardia with ST depression, HTN witth left TKA 04/2010 (neg cardiac w/u)   Coronary artery disease    Difficult intubation    glidescope used 04/06/10   DJD (degenerative joint disease) of knee     Family history of bipolar disorder    Heart murmur    History of kidney stones    HLD (hyperlipidemia)    Overweight    Piriformis syndrome of right side      Social History   Tobacco Use  Smoking Status Former   Current packs/day: 0.00   Types: Cigarettes   Quit date: 03/11/1995   Years since quitting: 28.4  Smokeless Tobacco Never    Social History   Substance and Sexual Activity  Alcohol Use Yes   Comment: maybe 1 beer per week     No Known Allergies  Current Outpatient Medications  Medication Sig Dispense Refill   aspirin EC 325 MG tablet Take 1 tablet (325 mg total) by mouth daily.     atorvastatin (LIPITOR) 40 MG tablet Take 1 tablet (40 mg total) by mouth daily. 30 tablet 1   Ibuprofen-diphenhydrAMINE HCl (ADVIL PM) 200-25 MG CAPS Take 2 tablets by mouth at bedtime.     MAGNESIUM PO Take 1 tablet by mouth at bedtime.     metoprolol tartrate (LOPRESSOR) 50 MG tablet Take 1 tablet (50 mg total) by mouth 2 (two) times daily. 60 tablet 1  Multiple Vitamin (MULTI VITAMIN) TABS Take 1 tablet by mouth at bedtime.     traMADol (ULTRAM) 50 MG tablet Take 1 tablet (50 mg total) by mouth every 6 (six) hours as needed for severe pain (pain score 7-10). 24 tablet 0   TURMERIC PO Take 1 tablet by mouth at bedtime.     No current facility-administered medications for this visit.       Physical Exam: Ht 5\' 10"  (1.778 m)   BMI 31.37 kg/m   General appearance: alert, cooperative, and no distress Heart: regular rate and rhythm Lungs: clear to auscultation bilaterally Extremities: No edema Wound: Incisions well-healed without evidence of infection   Diagnostic Studies & Laboratory data:     Recent Radiology Findings:   DG Chest 2 View Result Date: 08/07/2023 CLINICAL DATA:  Status post CABG on 06/13/2023, follow-up EXAM: CHEST - 2 VIEW COMPARISON:  Prior chest x-ray 06/17/2023 FINDINGS: Patient is status post median sternotomy. The cardiac and mediastinal contours are  within normal limits. No evidence of pulmonary edema or pleural effusion. Minimal bibasilar atelectasis is present. Otherwise, no focal airspace infiltrate. No pneumothorax. No acute osseous abnormality. Mild bilateral acromioclavicular joint arthritis. IMPRESSION: 1. No acute cardiopulmonary disease. 2. Surgical changes of prior CABG. 3. Mild bilateral AC joint osteoarthritis. Electronically Signed   By: Malachy Moan M.D.   On: 08/07/2023 11:03      Recent Lab Findings: Lab Results  Component Value Date   WBC 8.6 06/19/2023   HGB 13.4 06/19/2023   HCT 38.9 (L) 06/19/2023   PLT 285 06/19/2023   GLUCOSE 107 (H) 06/19/2023   CHOL 108 08/01/2023   TRIG 124 08/01/2023   HDL 41 08/01/2023   LDLDIRECT 89 05/22/2023   LDLCALC 45 08/01/2023   ALT 20 06/13/2023   AST 21 06/13/2023   NA 134 (L) 06/19/2023   K 4.1 06/19/2023   CL 101 06/19/2023   CREATININE 0.76 06/19/2023   BUN 21 06/19/2023   CO2 21 (L) 06/19/2023   INR 1.4 (H) 06/14/2023   HGBA1C 5.5 06/13/2023      Assessment / Plan: Excellent ongoing progress status post his single-vessel CABG.  He had multiple questions and was satisfied with answers.  Reviewed his chest x-ray and description is as above.  Did not make any changes to his current medication regimen.  We will see the patient again on a as needed basis for any surgically related needs or at request.      Medication Changes: No orders of the defined types were placed in this encounter.     Rowe Clack, PA-C  08/07/2023 1:13 PM

## 2023-08-07 NOTE — Patient Instructions (Signed)
 Activity progression as described including ambulation and driving protocols. He is stable to begin cardiac rehab.

## 2023-08-10 ENCOUNTER — Telehealth (HOSPITAL_COMMUNITY): Payer: Self-pay

## 2023-08-10 ENCOUNTER — Encounter (HOSPITAL_COMMUNITY): Payer: Self-pay

## 2023-08-10 NOTE — Telephone Encounter (Signed)
 Attempted to call patient in regards to Cardiac Rehab - LM on VM Mailed letter

## 2023-08-13 ENCOUNTER — Telehealth (HOSPITAL_COMMUNITY): Payer: Self-pay

## 2023-08-13 NOTE — Telephone Encounter (Signed)
 Pt insurance is active and benefits verified through [employer]. Co-pay $15.00, DED $0.00/$0.00 met, out of pocket $3,400.00/$80.00 met, co-insurance 0%. No pre-authorization required. Sudhir/[employer], 08/13/23 @ 1:54PM, [id number]   How many CR sessions are covered? (36 visits for TCR, 72 visits for ICR)72 Is this a lifetime maximum or an annual maximum? Annual Has the member used any of these services to date? No Is there a time limit (weeks/months) on start of program and/or program completion? No

## 2023-08-13 NOTE — Telephone Encounter (Signed)
  Called pt to confirm appt for 08/15/23 at 1330. Gave pt instructions for appt, what to wear, office address, eating/taking meds before, and if sick to call and reschedule. Pt voiced understanding, all questions answered.   Health history completed? Yes   Jonna Coup, MS, ACSM-CEP 08/13/2023 5:00 PM

## 2023-08-13 NOTE — Telephone Encounter (Signed)
 Pt returned CR phone call and stated he is interested in CR, patient will come in for orientation on 08/15/23 @ 130PM and will attend the 145PM exercise class.   Pensions consultant.

## 2023-08-15 ENCOUNTER — Other Ambulatory Visit: Payer: Self-pay | Admitting: Physician Assistant

## 2023-08-15 ENCOUNTER — Encounter (HOSPITAL_COMMUNITY)
Admission: RE | Admit: 2023-08-15 | Discharge: 2023-08-15 | Disposition: A | Discharge status: Home / Self Care | Source: Ambulatory Visit | Attending: Internal Medicine | Admitting: Internal Medicine

## 2023-08-15 VITALS — BP 114/62 | HR 72 | Ht 68.0 in | Wt 222.7 lb

## 2023-08-15 DIAGNOSIS — Z951 Presence of aortocoronary bypass graft: Secondary | ICD-10-CM | POA: Diagnosis not present

## 2023-08-15 DIAGNOSIS — Z48812 Encounter for surgical aftercare following surgery on the circulatory system: Secondary | ICD-10-CM | POA: Diagnosis not present

## 2023-08-15 NOTE — Progress Notes (Signed)
 Cardiac Rehab Medication Review   Does the patient  feel that his/her medications are working for him/her? Yes    Has the patient been experiencing any side effects to the medications prescribed? No  Does the patient measure his/her own blood pressure or blood glucose at home?   Yes  Does the patient have any problems obtaining medications due to transportation or finances?   No  Understanding of regimen: excellent Understanding of indications: excellent Potential of compliance: excellent    Comments: Isaac [name] understands his medications and regime well. He does not check his BP regularly, but has a cuff to do so.    Jonna Coup, MS, ACSM-CEP 08/15/2023 2:25 PM

## 2023-08-15 NOTE — Progress Notes (Signed)
 Cardiac Individual Treatment Plan  Patient Details  Name: Isaac [name] MRN: [id number] Date of Birth: February 09, 1946 Referring Provider:   Flowsheet Row INTENSIVE CARDIAC REHAB ORIENT from 08/15/2023 in [facility]  Referring Provider Manon Hilding, MD       Initial Encounter Date:  Flowsheet Row INTENSIVE CARDIAC REHAB ORIENT from 08/15/2023 in [facility]  Date 08/15/23       Visit Diagnosis: 06/14/23 CABG x 1  Patient's Home Medications on Admission:  Current Outpatient Medications:    aspirin EC 325 MG tablet, Take 1 tablet (325 mg total) by mouth daily., Disp: , Rfl:    atorvastatin (LIPITOR) 40 MG tablet, Take 1 tablet (40 mg total) by mouth daily., Disp: 30 tablet, Rfl: 1   Ibuprofen-diphenhydrAMINE HCl (ADVIL PM) 200-25 MG CAPS, Take 2 tablets by mouth at bedtime., Disp: , Rfl:    MAGNESIUM PO, Take 1 tablet by mouth at bedtime., Disp: , Rfl:    metoprolol tartrate (LOPRESSOR) 50 MG tablet, Take 1 tablet (50 mg total) by mouth 2 (two) times daily., Disp: 60 tablet, Rfl: 1   Multiple Vitamin (MULTI VITAMIN) TABS, Take 1 tablet by mouth at bedtime., Disp: , Rfl:    TURMERIC PO, Take 1 tablet by mouth at bedtime., Disp: , Rfl:    traMADol (ULTRAM) 50 MG tablet, Take 1 tablet (50 mg total) by mouth every 6 (six) hours as needed for severe pain (pain score 7-10). (Patient not taking: Reported on 08/15/2023), Disp: 24 tablet, Rfl: 0  Past Medical History: Past Medical History:  Diagnosis Date   Abnormal fasting glucose    Acute right-sided low back pain with right-sided sciatica    Anginal pain (HCC)    Arthritis    R knee- DJD   BMI 35.0-35.9,adult    Complication of anesthesia    tachycardia with ST depression, HTN witth left TKA 04/2010 (neg cardiac w/u)   Coronary artery disease    Difficult intubation    glidescope used 04/06/10   DJD  (degenerative joint disease) of knee    Family history of bipolar disorder    Heart murmur    History of kidney stones    HLD (hyperlipidemia)    Overweight    Piriformis syndrome of right side     Tobacco Use: Social History   Tobacco Use  Smoking Status Former   Current packs/day: 0.00   Types: Cigarettes   Quit date: 03/11/1995   Years since quitting: 28.4  Smokeless Tobacco Never    Labs: Review Flowsheet  More data may exist      Latest Ref Rng & Units 04/07/2010 05/22/2023 06/13/2023 06/14/2023 08/01/2023  Labs for ITP Cardiac and Pulmonary Rehab  Cholestrol 100 - 199 mg/dL 782 (NOTE) ATP III Classification:      < 200        mg/dL        Desirable     956 - 239     mg/dL        Borderline High     >= 240        mg/dL        High   - - - 213   LDL (calc) 0 - 99 mg/dL 81 (NOTE)  Total Cholesterol/HDL Ratio:CHD Risk                       Coronary  Heart Disease Risk Table                                       Men       Women         1/2 Average Risk              3.4        3.3             Average Risk              5.0         4.4         2 X Average Risk              9.6        7.1         3 X Average Risk             23.4       11.0 Use the calculated Patient Ratio above and the CHD Risk table  to determine the patient's CHD Risk. ATP III Classification (LDL):      < 100         mg/dL         Optimal     244 - 129     mg/dL         Near or Above Optimal     130 - 159     mg/dL         Borderline High     160 - 189     mg/dL         High      > 010        mg/dL         Very High   - - - 45   Direct LDL 0 - 99 mg/dL - 89  - - -  HDL-C >27 mg/dL 43  - - - 41   Trlycerides 0 - 149 mg/dL 80  - - - 253   Hemoglobin A1c 4.8 - 5.6 % - - 5.5  - -  PH, Arterial 7.35 - 7.45 - - - 7.321  7.375  7.385  7.303  7.293  7.308  -  PCO2 arterial 32 - 48 mmHg - - - 41.9  32.4  34.5  39.2  42.5  44.4  -  Bicarbonate 20.0 - 28.0 mmol/L - - - 21.7  19.1  21.4  19.5  20.6  22.2  -  TCO2 22 - 32 mmol/L -  - - 23  20  23  21  22  22  22  21  24  23   -  Acid-base deficit 0.0 - 2.0 mmol/L - - - 4.0  6.0  4.0  6.0  6.0  4.0  -  O2 Saturation % - - - 95  95  97  99  99  100  -    Details       Multiple values from one day are sorted in reverse-chronological order         Capillary Blood Glucose: Lab Results  Component Value Date   GLUCAP 107 (H) 06/20/2023   GLUCAP 127 (H) 06/19/2023   GLUCAP 126 (H) 06/19/2023   GLUCAP 106 (H) 06/19/2023   GLUCAP 105 (H) 06/19/2023     Exercise Target Goals: Exercise Program Goal:  Individual exercise prescription set using results from initial 6 min walk test and THRR while considering  patient's activity barriers and safety.   Exercise Prescription Goal: Initial exercise prescription builds to 30-45 minutes a day of aerobic activity, 2-3 days per week.  Home exercise guidelines will be given to patient during program as part of exercise prescription that the participant will acknowledge.  Activity Barriers & Risk Stratification:  Activity Barriers & Cardiac Risk Stratification - 08/15/23 1426       Activity Barriers & Cardiac Risk Stratification   Activity Barriers Arthritis;Left Knee Replacement;Right Knee Replacement;Balance Concerns;Incisional Pain;Joint Problems;Other (comment)    Comments sternal precautions    Cardiac Risk Stratification High   <5 METs on            6 Minute Walk:  6 Minute Walk     Row Name 08/15/23 1539         6 Minute Walk   Phase Initial     Distance 1560 feet     Walk Time 6 minutes     # of Rest Breaks 0     MPH 2.95     METS 2.5     RPE 12     Perceived Dyspnea  0     VO2 Peak 8.77     Symptoms No     Resting HR 72 bpm     Resting BP 114/62     Resting Oxygen Saturation  97 %     Exercise Oxygen Saturation  during 6 min walk 97 %     Max Ex. HR 90 bpm     Max Ex. BP 138/70     2 Minute Post BP 118/68              Oxygen Initial Assessment:   Oxygen  Re-Evaluation:   Oxygen Discharge (Final Oxygen Re-Evaluation):   Initial Exercise Prescription:  Initial Exercise Prescription - 08/15/23 1500       Date of Initial Exercise RX and Referring Provider   Date 08/15/23    Referring Provider Manon Hilding, MD    Expected Discharge Date 11/07/23      Recumbant Bike   Level 1    RPM 50    Watts 30    Minutes 15    METs 2      NuStep   Level 1    SPM 70    Minutes 15    METs 2      Prescription Details   Frequency (times per week) 3    Duration Progress to 30 minutes of continuous aerobic without signs/symptoms of physical distress      Intensity   THRR 40-80% of Max Heartrate 57-117    Ratings of Perceived Exertion 11-13    Perceived Dyspnea 0-4      Progression   Progression Continue progressive overload as per policy without signs/symptoms or physical distress.      Resistance Training   Training Prescription Yes    Weight 3    Reps 10-15             Perform Capillary Blood Glucose checks as needed.  Exercise Prescription Changes:   Exercise Comments:   Exercise Goals and Review:   Exercise Goals     Row Name 08/15/23 1426             Exercise Goals   Increase Physical Activity Yes       Intervention Provide advice, education, support and counseling  about physical activity/exercise needs.;Develop an individualized exercise prescription for aerobic and resistive training based on initial evaluation findings, risk stratification, comorbidities and participant's personal goals.       Expected Outcomes Short Term: Attend rehab on a regular basis to increase amount of physical activity.;Long Term: Exercising regularly at least 3-5 days a week.;Long Term: Add in home exercise to make exercise part of routine and to increase amount of physical activity.       Increase Strength and Stamina Yes       Intervention Provide advice, education, support and counseling about physical activity/exercise  needs.;Develop an individualized exercise prescription for aerobic and resistive training based on initial evaluation findings, risk stratification, comorbidities and participant's personal goals.       Expected Outcomes Short Term: Increase workloads from initial exercise prescription for resistance, speed, and METs.;Short Term: Perform resistance training exercises routinely during rehab and add in resistance training at home;Long Term: Improve cardiorespiratory fitness, muscular endurance and strength as measured by increased METs and functional capacity ( )       Able to understand and use rate of perceived exertion (RPE) scale Yes       Intervention Provide education and explanation on how to use RPE scale       Expected Outcomes Short Term: Able to use RPE daily in rehab to express subjective intensity level;Long Term:  Able to use RPE to guide intensity level when exercising independently       Knowledge and understanding of Target Heart Rate Range (THRR) Yes       Intervention Provide education and explanation of THRR including how the numbers were predicted and where they are located for reference       Expected Outcomes Short Term: Able to state/look up THRR;Short Term: Able to use daily as guideline for intensity in rehab;Long Term: Able to use THRR to govern intensity when exercising independently       Understanding of Exercise Prescription Yes       Intervention Provide education, explanation, and written materials on patient's individual exercise prescription       Expected Outcomes Short Term: Able to explain program exercise prescription;Long Term: Able to explain home exercise prescription to exercise independently                Exercise Goals Re-Evaluation :   Discharge Exercise Prescription (Final Exercise Prescription Changes):   Nutrition:  Target Goals: Understanding of nutrition guidelines, daily intake of sodium 1500mg , cholesterol 200mg , calories 30% from fat  and 7% or less from saturated fats, daily to have 5 or more servings of fruits and vegetables.  Biometrics:  Pre Biometrics - 08/15/23 1424       Pre Biometrics   Waist Circumference 44 inches    Hip Circumference 45 inches    Waist to Hip Ratio 0.98 %    Triceps Skinfold 18 mm    % Body Fat 32.8 %    Grip Strength 32 kg    Flexibility 0 in   could not reach   Single Leg Stand 4.56 seconds              Nutrition Therapy Plan and Nutrition Goals:   Nutrition Assessments:  MEDIFICTS Score Key: >=70 Need to make dietary changes  40-70 Heart Healthy Diet <= 40 Therapeutic Level Cholesterol Diet    Picture Your Plate Scores: <16 Unhealthy dietary pattern with much room for improvement. 41-50 Dietary pattern unlikely to meet recommendations for good health and room for  improvement. 51-60 More healthful dietary pattern, with some room for improvement.  >60 Healthy dietary pattern, although there may be some specific behaviors that could be improved.    Nutrition Goals Re-Evaluation:   Nutrition Goals Re-Evaluation:   Nutrition Goals Discharge (Final Nutrition Goals Re-Evaluation):   Psychosocial: Target Goals: Acknowledge presence or absence of significant depression and/or stress, maximize coping skills, provide positive support system. Participant is able to verbalize types and ability to use techniques and skills needed for reducing stress and depression.  Initial Review & Psychosocial Screening:  Initial Psych Review & Screening - 08/15/23 1426       Initial Review   Current issues with None Identified      Family Dynamics   Good Support System? Yes   Daughter for support   Comments Isaac [name] denies any feelings of anxiety/stress/depression. He said he feels worried occasionally due to having other blockages that they couldn't fix with previous CABG but he plans to discuss this with his cardiologist in April. 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       Barriers   Psychosocial barriers to participate in program There are no identifiable barriers or psychosocial needs.      Screening Interventions   Interventions Encouraged to exercise;Provide feedback about the scores to participant    Expected Outcomes Short Term goal: Identification and review with participant of any Quality of Life or Depression concerns found by scoring the questionnaire.;Long Term goal: The participant improves quality of Life and PHQ9 Scores as seen by post scores and/or verbalization of changes             Quality of Life Scores:  Quality of Life - 08/15/23 1444       Quality of Life   Select Quality of Life      Quality of Life Scores   Health/Function Pre 29.2 %    Socioeconomic Pre 30 %    Psych/Spiritual Pre 30 %    Family Pre 28.13 %    GLOBAL Pre 29.41 %            Scores of 19 and below usually indicate a poorer quality of life in these areas.  A difference of  2-3 points is a clinically meaningful difference.  A difference of 2-3 points in the total score of the Quality of Life Index has been associated with significant improvement in overall quality of life, self-image, physical symptoms, and general health in studies assessing change in quality of life.  PHQ-9: Review Flowsheet       08/15/2023  Depression screen PHQ 2/9  Decreased Interest 0  Down, Depressed, Hopeless 0  PHQ - 2 Score 0  Altered sleeping 0  Tired, decreased energy 0  Change in appetite 0  Feeling bad or failure about yourself  0  Trouble concentrating 0  Moving slowly or fidgety/restless 0  Suicidal thoughts 0  PHQ-9 Score 0   Interpretation of Total Score  Total Score Depression Severity:  1-4 = Minimal depression, 5-9 = Mild depression, 10-14 = Moderate depression, 15-19 = Moderately severe depression, 20-27 = Severe depression   Psychosocial Evaluation and Intervention:   Psychosocial Re-Evaluation:   Psychosocial Discharge (Final Psychosocial  Re-Evaluation):   Vocational Rehabilitation: Provide vocational rehab assistance to qualifying candidates.   Vocational Rehab Evaluation & Intervention:  Vocational Rehab - 08/15/23 1430       Initial Vocational Rehab Evaluation & Intervention   Assessment shows need for Vocational Rehabilitation No  Isaac [name] is retired            Education: Education Goals: Education classes will be provided on a weekly basis, covering required topics. Participant will state understanding/return demonstration of topics presented.     Core Videos: Exercise    Move It!  Clinical staff conducted group or individual video education with verbal and written material and guidebook.  Patient learns the recommended Pritikin exercise program. Exercise with the goal of living a long, healthy life. Some of the health benefits of exercise include controlled diabetes, healthier blood pressure levels, improved cholesterol levels, improved heart and lung capacity, improved sleep, and better body composition. Everyone should speak with their doctor before starting or changing an exercise routine.  Biomechanical Limitations Clinical staff conducted group or individual video education with verbal and written material and guidebook.  Patient learns how biomechanical limitations can impact exercise and how we can mitigate and possibly overcome limitations to have an impactful and balanced exercise routine.  Body Composition Clinical staff conducted group or individual video education with verbal and written material and guidebook.  Patient learns that body composition (ratio of muscle mass to fat mass) is a key component to assessing overall fitness, rather than body weight alone. Increased fat mass, especially visceral belly fat, can put [place] at increased risk for metabolic syndrome, type 2 diabetes, heart disease, and even death. It is recommended to combine diet and exercise (cardiovascular and resistance training) to  improve your body composition. Seek guidance from your physician and exercise physiologist before implementing an exercise routine.  Exercise Action Plan Clinical staff conducted group or individual video education with verbal and written material and guidebook.  Patient learns the recommended strategies to achieve and enjoy long-term exercise adherence, including variety, self-motivation, self-efficacy, and positive decision making. Benefits of exercise include fitness, good health, weight management, more energy, better sleep, less stress, and overall well-being.  Medical   Heart Disease Risk Reduction Clinical staff conducted group or individual video education with verbal and written material and guidebook.  Patient learns our heart is our most vital organ as it circulates oxygen, nutrients, white blood cells, and hormones throughout the entire body, and carries waste away. Data supports a plant-based eating plan like the Pritikin Program for its effectiveness in slowing progression of and reversing heart disease. The video provides a number of recommendations to address heart disease.   Metabolic Syndrome and Belly Fat  Clinical staff conducted group or individual video education with verbal and written material and guidebook.  Patient learns what metabolic syndrome is, how it leads to heart disease, and how one can reverse it and keep it from coming back. You have metabolic syndrome if you have 3 of the following 5 criteria: abdominal obesity, high blood pressure, high triglycerides, low HDL cholesterol, and high blood sugar.  Hypertension and Heart Disease Clinical staff conducted group or individual video education with verbal and written material and guidebook.  Patient learns that high blood pressure, or hypertension, is very common in the [place]. Hypertension is largely due to excessive salt intake, but other important risk factors include being overweight, physical inactivity,  drinking too much alcohol, smoking, and not eating enough potassium from fruits and vegetables. High blood pressure is a leading risk factor for heart attack, stroke, congestive heart failure, dementia, kidney failure, and premature death. Long-term effects of excessive salt intake include stiffening of the arteries and thickening of heart muscle and organ damage. Recommendations include ways to reduce hypertension and the  risk of heart disease.  Diseases of Our Time - Focusing on Diabetes Clinical staff conducted group or individual video education with verbal and written material and guidebook.  Patient learns why the best way to stop diseases of our time is prevention, through food and other lifestyle changes. Medicine (such as prescription pills and surgeries) is often only a Band-Aid on the problem, not a long-term solution. Most common diseases of our time include obesity, type 2 diabetes, hypertension, heart disease, and cancer. The Pritikin Program is recommended and has been proven to help reduce, reverse, and/or prevent the damaging effects of metabolic syndrome.  Nutrition   Overview of the Pritikin Eating Plan  Clinical staff conducted group or individual video education with verbal and written material and guidebook.  Patient learns about the Pritikin Eating Plan for disease risk reduction. The Pritikin Eating Plan emphasizes a wide variety of unrefined, minimally-processed carbohydrates, like fruits, vegetables, whole grains, and legumes. Go, Caution, and Stop food choices are explained. Plant-based and lean animal proteins are emphasized. Rationale provided for low sodium intake for blood pressure control, low added sugars for blood sugar stabilization, and low added fats and oils for coronary artery disease risk reduction and weight management.  Calorie Density  Clinical staff conducted group or individual video education with verbal and written material and guidebook.  Patient learns  about calorie density and how it impacts the Pritikin Eating Plan. Knowing the characteristics of the food you choose will help you decide whether those foods will lead to weight gain or weight loss, and whether you want to consume more or less of them. Weight loss is usually a side effect of the Pritikin Eating Plan because of its focus on low calorie-dense foods.  Label Reading  Clinical staff conducted group or individual video education with verbal and written material and guidebook.  Patient learns about the Pritikin recommended label reading guidelines and corresponding recommendations regarding calorie density, added sugars, sodium content, and whole grains.  Dining Out - Part 1  Clinical staff conducted group or individual video education with verbal and written material and guidebook.  Patient learns that restaurant meals can be sabotaging because they can be so high in calories, fat, sodium, and/or sugar. Patient learns recommended strategies on how to positively address this and avoid unhealthy pitfalls.  Facts on Fats  Clinical staff conducted group or individual video education with verbal and written material and guidebook.  Patient learns that lifestyle modifications can be just as effective, if not more so, as many medications for lowering your risk of heart disease. A Pritikin lifestyle can help to reduce your risk of inflammation and atherosclerosis (cholesterol build-up, or plaque, in the artery walls). Lifestyle interventions such as dietary choices and physical activity address the cause of atherosclerosis. A review of the types of fats and their impact on blood cholesterol levels, along with dietary recommendations to reduce fat intake is also included.  Nutrition Action Plan  Clinical staff conducted group or individual video education with verbal and written material and guidebook.  Patient learns how to incorporate Pritikin recommendations into their lifestyle.  Recommendations include planning and keeping personal health goals in mind as an important part of their success.  Healthy Mind-Set    Healthy Minds, Bodies, Hearts  Clinical staff conducted group or individual video education with verbal and written material and guidebook.  Patient learns how to identify when they are stressed. Video will discuss the impact of that stress, as well as the many benefits  of stress management. Patient will also be introduced to stress management techniques. The way we think, act, and feel has an impact on our hearts.  How Our Thoughts Can Heal Our Hearts  Clinical staff conducted group or individual video education with verbal and written material and guidebook.  Patient learns that negative thoughts can cause depression and anxiety. This can result in negative lifestyle behavior and serious health problems. Cognitive behavioral therapy is an effective method to help control our thoughts in order to change and improve our emotional outlook.  Additional Videos:  Exercise    Improving Performance  Clinical staff conducted group or individual video education with verbal and written material and guidebook.  Patient learns to use a non-linear approach by alternating intensity levels and lengths of time spent exercising to help burn more calories and lose more body fat. Cardiovascular exercise helps improve heart health, metabolism, hormonal balance, blood sugar control, and recovery from fatigue. Resistance training improves strength, endurance, balance, coordination, reaction time, metabolism, and muscle mass. Flexibility exercise improves circulation, posture, and balance. Seek guidance from your physician and exercise physiologist before implementing an exercise routine and learn your capabilities and proper form for all exercise.  Introduction to Yoga  Clinical staff conducted group or individual video education with verbal and written material and guidebook.   Patient learns about yoga, a discipline of the coming together of mind, breath, and body. The benefits of yoga include improved flexibility, improved range of motion, better posture and core strength, increased lung function, weight loss, and positive self-image. Yoga's heart health benefits include lowered blood pressure, healthier heart rate, decreased cholesterol and triglyceride levels, improved immune function, and reduced stress. Seek guidance from your physician and exercise physiologist before implementing an exercise routine and learn your capabilities and proper form for all exercise.  Medical   Aging: Enhancing Your Quality of Life  Clinical staff conducted group or individual video education with verbal and written material and guidebook.  Patient learns key strategies and recommendations to stay in good physical health and enhance quality of life, such as prevention strategies, having an advocate, securing a Health Care Proxy and Power of Attorney, and keeping a list of medications and system for tracking them. It also discusses how to avoid risk for bone loss.  Biology of Weight Control  Clinical staff conducted group or individual video education with verbal and written material and guidebook.  Patient learns that weight gain occurs because we consume more calories than we burn (eating more, moving less). Even if your body weight is normal, you may have higher ratios of fat compared to muscle mass. Too much body fat puts you at increased risk for cardiovascular disease, heart attack, stroke, type 2 diabetes, and obesity-related cancers. In addition to exercise, following the Pritikin Eating Plan can help reduce your risk.  Decoding Lab Results  Clinical staff conducted group or individual video education with verbal and written material and guidebook.  Patient learns that lab test reflects one measurement whose values change over time and are influenced by many factors, including  medication, stress, sleep, exercise, food, hydration, pre-existing medical conditions, and more. It is recommended to use the knowledge from this video to become more involved with your lab results and evaluate your numbers to speak with your doctor.   Diseases of Our Time - Overview  Clinical staff conducted group or individual video education with verbal and written material and guidebook.  Patient learns that according to the CDC, 50% to 70%  of chronic diseases (such as obesity, type 2 diabetes, elevated lipids, hypertension, and heart disease) are avoidable through lifestyle improvements including healthier food choices, listening to satiety cues, and increased physical activity.  Sleep Disorders Clinical staff conducted group or individual video education with verbal and written material and guidebook.  Patient learns how good quality and duration of sleep are important to overall health and well-being. Patient also learns about sleep disorders and how they impact health along with recommendations to address them, including discussing with a physician.  Nutrition  Dining Out - Part 2 Clinical staff conducted group or individual video education with verbal and written material and guidebook.  Patient learns how to plan ahead and communicate in order to maximize their dining experience in a healthy and nutritious manner. Included are recommended food choices based on the type of restaurant the patient is visiting.   Fueling a Banker conducted group or individual video education with verbal and written material and guidebook.  There is a strong connection between our food choices and our health. Diseases like obesity and type 2 diabetes are very prevalent and are in large-part due to lifestyle choices. The Pritikin Eating Plan provides plenty of food and hunger-curbing satisfaction. It is easy to follow, affordable, and helps reduce health risks.  Menu Workshop  Clinical  staff conducted group or individual video education with verbal and written material and guidebook.  Patient learns that restaurant meals can sabotage health goals because they are often packed with calories, fat, sodium, and sugar. Recommendations include strategies to plan ahead and to communicate with the manager, chef, or server to help order a healthier meal.  Planning Your Eating Strategy  Clinical staff conducted group or individual video education with verbal and written material and guidebook.  Patient learns about the Pritikin Eating Plan and its benefit of reducing the risk of disease. The Pritikin Eating Plan does not focus on calories. Instead, it emphasizes high-quality, nutrient-rich foods. By knowing the characteristics of the foods, we choose, we can determine their calorie density and make informed decisions.  Targeting Your Nutrition Priorities  Clinical staff conducted group or individual video education with verbal and written material and guidebook.  Patient learns that lifestyle habits have a tremendous impact on disease risk and progression. This video provides eating and physical activity recommendations based on your personal health goals, such as reducing LDL cholesterol, losing weight, preventing or controlling type 2 diabetes, and reducing high blood pressure.  Vitamins and Minerals  Clinical staff conducted group or individual video education with verbal and written material and guidebook.  Patient learns different ways to obtain key vitamins and minerals, including through a recommended healthy diet. It is important to discuss all supplements you take with your doctor.   Healthy Mind-Set    Smoking Cessation  Clinical staff conducted group or individual video education with verbal and written material and guidebook.  Patient learns that cigarette smoking and tobacco addiction pose a serious health risk which affects millions of people. Stopping smoking will  significantly reduce the risk of heart disease, lung disease, and many forms of cancer. Recommended strategies for quitting are covered, including working with your doctor to develop a successful plan.  Culinary   Becoming a Set designer conducted group or individual video education with verbal and written material and guidebook.  Patient learns that cooking at home can be healthy, cost-effective, quick, and puts them in control. Keys to cooking healthy recipes will  include looking at your recipe, assessing your equipment needs, planning ahead, making it simple, choosing cost-effective seasonal ingredients, and limiting the use of added fats, salts, and sugars.  Cooking - Breakfast and Snacks  Clinical staff conducted group or individual video education with verbal and written material and guidebook.  Patient learns how important breakfast is to satiety and nutrition through the entire day. Recommendations include key foods to eat during breakfast to help stabilize blood sugar levels and to prevent overeating at meals later in the day. Planning ahead is also a key component.  Cooking - Educational psychologist conducted group or individual video education with verbal and written material and guidebook.  Patient learns eating strategies to improve overall health, including an approach to cook more at home. Recommendations include thinking of animal protein as a side on your plate rather than center stage and focusing instead on lower calorie dense options like vegetables, fruits, whole grains, and plant-based proteins, such as beans. Making sauces in large quantities to freeze for later and leaving the skin on your vegetables are also recommended to maximize your experience.  Cooking - Healthy Salads and Dressing Clinical staff conducted group or individual video education with verbal and written material and guidebook.  Patient learns that vegetables, fruits, whole grains,  and legumes are the foundations of the Pritikin Eating Plan. Recommendations include how to incorporate each of these in flavorful and healthy salads, and how to create homemade salad dressings. Proper handling of ingredients is also covered. Cooking - Soups and [employer] - Soups and Desserts Clinical staff conducted group or individual video education with verbal and written material and guidebook.  Patient learns that Pritikin soups and desserts make for easy, nutritious, and delicious snacks and meal components that are low in sodium, fat, sugar, and calorie density, while high in vitamins, minerals, and filling fiber. Recommendations include simple and healthy ideas for soups and desserts.   Overview     The Pritikin Solution Program Overview Clinical staff conducted group or individual video education with verbal and written material and guidebook.  Patient learns that the results of the Pritikin Program have been documented in more than 100 articles published in peer-reviewed journals, and the benefits include reducing risk factors for (and, in some cases, even reversing) high cholesterol, high blood pressure, type 2 diabetes, obesity, and more! An overview of the three key pillars of the Pritikin Program will be covered: eating well, doing regular exercise, and having a healthy mind-set.  WORKSHOPS  Exercise: Exercise Basics: Building Your Action Plan Clinical staff led group instruction and group discussion with PowerPoint presentation and patient guidebook. To enhance the learning environment the use of posters, models and videos may be added. At the conclusion of this workshop, patients will comprehend the difference between physical activity and exercise, as well as the benefits of incorporating both, into their routine. Patients will understand the FITT (Frequency, Intensity, Time, and Type) principle and how to use it to build an exercise action plan. In addition, safety  concerns and other considerations for exercise and cardiac rehab will be addressed by the presenter. The purpose of this lesson is to promote a comprehensive and effective weekly exercise routine in order to improve patients' overall level of fitness.   Managing Heart Disease: Your Path to a Healthier Heart Clinical staff led group instruction and group discussion with PowerPoint presentation and patient guidebook. To enhance the learning environment the use of posters, models and videos may  be added.At the conclusion of this workshop, patients will understand the anatomy and physiology of the heart. Additionally, they will understand how Pritikin's three pillars impact the risk factors, the progression, and the management of heart disease.  The purpose of this lesson is to provide a high-level overview of the heart, heart disease, and how the Pritikin lifestyle positively impacts risk factors.  Exercise Biomechanics Clinical staff led group instruction and group discussion with PowerPoint presentation and patient guidebook. To enhance the learning environment the use of posters, models and videos may be added. Patients will learn how the structural parts of their bodies function and how these functions impact their daily activities, movement, and exercise. Patients will learn how to promote a neutral spine, learn how to manage pain, and identify ways to improve their physical movement in order to promote healthy living. The purpose of this lesson is to expose patients to common physical limitations that impact physical activity. Participants will learn practical ways to adapt and manage aches and pains, and to minimize their effect on regular exercise. Patients will learn how to maintain good posture while sitting, walking, and lifting.  Balance Training and Fall Prevention  Clinical staff led group instruction and group discussion with PowerPoint presentation and patient guidebook. To  enhance the learning environment the use of posters, models and videos may be added. At the conclusion of this workshop, patients will understand the importance of their sensorimotor skills (vision, proprioception, and the vestibular system) in maintaining their ability to balance as they age. Patients will apply a variety of balancing exercises that are appropriate for their current level of function. Patients will understand the common causes for poor balance, possible solutions to these problems, and ways to modify their physical environment in order to minimize their fall risk. The purpose of this lesson is to teach patients about the importance of maintaining balance as they age and ways to minimize their risk of falling.  WORKSHOPS   Nutrition:  Fueling a Ship broker led group instruction and group discussion with PowerPoint presentation and patient guidebook. To enhance the learning environment the use of posters, models and videos may be added. Patients will review the foundational principles of the Pritikin Eating Plan and understand what constitutes a serving size in each of the food groups. Patients will also learn Pritikin-friendly foods that are better choices when away from home and review make-ahead meal and snack options. Calorie density will be reviewed and applied to three nutrition priorities: weight maintenance, weight loss, and weight gain. The purpose of this lesson is to reinforce (in a group setting) the key concepts around what patients are recommended to eat and how to apply these guidelines when away from home by planning and selecting Pritikin-friendly options. Patients will understand how calorie density may be adjusted for different weight management goals.  Mindful Eating  Clinical staff led group instruction and group discussion with PowerPoint presentation and patient guidebook. To enhance the learning environment the use of posters, models and videos may  be added. Patients will briefly review the concepts of the Pritikin Eating Plan and the importance of low-calorie dense foods. The concept of mindful eating will be introduced as well as the importance of paying attention to internal hunger signals. Triggers for non-hunger eating and techniques for dealing with triggers will be explored. The purpose of this lesson is to provide patients with the opportunity to review the basic principles of the Pritikin Eating Plan, discuss the value of eating mindfully  and how to measure internal cues of hunger and fullness using the Hunger Scale. Patients will also discuss reasons for non-hunger eating and learn strategies to use for controlling emotional eating.  Targeting Your Nutrition Priorities Clinical staff led group instruction and group discussion with PowerPoint presentation and patient guidebook. To enhance the learning environment the use of posters, models and videos may be added. Patients will learn how to determine their genetic susceptibility to disease by reviewing their family history. Patients will gain insight into the importance of diet as part of an overall healthy lifestyle in mitigating the impact of genetics and other environmental insults. The purpose of this lesson is to provide patients with the opportunity to assess their personal nutrition priorities by looking at their family history, their own health history and current risk factors. Patients will also be able to discuss ways of prioritizing and modifying the Pritikin Eating Plan for their highest risk areas  Menu  Clinical staff led group instruction and group discussion with PowerPoint presentation and patient guidebook. To enhance the learning environment the use of posters, models and videos may be added. Using menus brought in from [employer], or printed from [employer], patients will apply the Pritikin dining out guidelines that were presented in the [employer] video. Patients will also be able to practice these guidelines in a variety of provided scenarios. The purpose of this lesson is to provide patients with the opportunity to practice hands-on learning of the Pritikin Dining Out guidelines with actual menus and practice scenarios.  Label Reading Clinical staff led group instruction and group discussion with PowerPoint presentation and patient guidebook. To enhance the learning environment the use of posters, models and videos may be added. Patients will review and discuss the Pritikin label reading guidelines presented in Pritikin's Label Reading Educational series video. Using fool labels brought in from local grocery stores and markets, patients will apply the label reading guidelines and determine if the packaged food meet the Pritikin guidelines. The purpose of this lesson is to provide patients with the opportunity to review, discuss, and practice hands-on learning of the Pritikin Label Reading guidelines with actual packaged food labels. Cooking School  Pritikin's [employer] are designed to teach patients ways to prepare quick, simple, and affordable recipes at home. The importance of nutrition's role in chronic disease risk reduction is reflected in its emphasis in the overall Pritikin program. By learning how to prepare essential core Pritikin Eating Plan recipes, patients will increase control over what they eat; be able to customize the flavor of foods without the use of added salt, sugar, or fat; and improve the quality of the food they consume. By learning a set of core recipes which are easily assembled, quickly prepared, and affordable, patients are more likely to prepare more healthy foods at home. These workshops focus on convenient breakfasts, simple entres, side dishes, and desserts which can be prepared with minimal effort and are consistent with nutrition recommendations for cardiovascular risk reduction. Cooking  [employer] are taught by a Armed forces logistics/support/administrative officer (RD) who has been trained by the [employer]. The chef or RD has a clear understanding of the importance of minimizing - if not completely eliminating - added fat, sugar, and sodium in recipes. Throughout the series of Cooking School Workshop sessions, patients will learn about healthy ingredients and efficient methods of cooking to build confidence in their capability to prepare    [employer] weekly topics:  Adding Flavor- Sodium-Free  Fast and Healthy Breakfasts  Powerhouse Plant-Based Proteins  Satisfying Salads and Dressings  Simple Sides and Sauces  International Cuisine-Spotlight on the Blue Zones  Delicious Desserts  Savory Soups  Efficiency Cooking - Meals in a Snap  Tasty Appetizers and Snacks  Comforting Weekend Breakfasts  One-Pot Wonders   Fast Evening Meals  Landscape architect Your Pritikin Plate  WORKSHOPS   Healthy Mindset (Psychosocial):  Focused Goals, Sustainable Changes Clinical staff led group instruction and group discussion with PowerPoint presentation and patient guidebook. To enhance the learning environment the use of posters, models and videos may be added. Patients will be able to apply effective goal setting strategies to establish at least one personal goal, and then take consistent, meaningful action toward that goal. They will learn to identify common barriers to achieving personal goals and develop strategies to overcome them. Patients will also gain an understanding of how our mind-set can impact our ability to achieve goals and the importance of cultivating a positive and growth-oriented mind-set. The purpose of this lesson is to provide patients with a deeper understanding of how to set and achieve personal goals, as well as the tools and strategies needed to overcome common obstacles which may arise along the way.  From Head to Heart: The Power of a Healthy  Outlook  Clinical staff led group instruction and group discussion with PowerPoint presentation and patient guidebook. To enhance the learning environment the use of posters, models and videos may be added. Patients will be able to recognize and describe the impact of emotions and mood on physical health. They will discover the importance of self-care and explore self-care practices which may work for them. Patients will also learn how to utilize the 4 C's to cultivate a healthier outlook and better manage stress and challenges. The purpose of this lesson is to demonstrate to patients how a healthy outlook is an essential part of maintaining good health, especially as they continue their cardiac rehab journey.  Healthy Sleep for a Healthy Heart Clinical staff led group instruction and group discussion with PowerPoint presentation and patient guidebook. To enhance the learning environment the use of posters, models and videos may be added. At the conclusion of this workshop, patients will be able to demonstrate knowledge of the importance of sleep to overall health, well-being, and quality of life. They will understand the symptoms of, and treatments for, common sleep disorders. Patients will also be able to identify daytime and nighttime behaviors which impact sleep, and they will be able to apply these tools to help manage sleep-related challenges. The purpose of this lesson is to provide patients with a general overview of sleep and outline the importance of quality sleep. Patients will learn about a few of the most common sleep disorders. Patients will also be introduced to the concept of "sleep hygiene," and discover ways to self-manage certain sleeping problems through simple daily behavior changes. Finally, the workshop will motivate patients by clarifying the links between quality sleep and their goals of heart-healthy living.   Recognizing and Reducing Stress Clinical staff led group instruction and  group discussion with PowerPoint presentation and patient guidebook. To enhance the learning environment the use of posters, models and videos may be added. At the conclusion of this workshop, patients will be able to understand the types of stress reactions, differentiate between acute and chronic stress, and recognize the impact that chronic stress has on their health. They will also be able to apply  different coping mechanisms, such as reframing negative self-talk. Patients will have the opportunity to practice a variety of stress management techniques, such as deep abdominal breathing, progressive muscle relaxation, and/or guided imagery.  The purpose of this lesson is to educate patients on the role of stress in their lives and to provide healthy techniques for coping with it.  Learning Barriers/Preferences:  Learning Barriers/Preferences - 08/15/23 1430       Learning Barriers/Preferences   Learning Barriers None    Learning Preferences Audio;Group Instruction;Individual Instruction;Pictoral;Skilled Demonstration;Verbal Instruction;Written Material;Video             Education Topics:  Knowledge Questionnaire Score:  Knowledge Questionnaire Score - 08/15/23 1431       Knowledge Questionnaire Score   Pre Score 22/24             Core Components/Risk Factors/Patient Goals at Admission:  Personal Goals and Risk Factors at Admission - 08/15/23 1430       Core Components/Risk Factors/Patient Goals on Admission    Weight Management Yes;Obesity    Intervention Weight Management: Provide education and appropriate resources to help participant work on and attain dietary goals.;Weight Management: Develop a combined nutrition and exercise program designed to reach desired caloric intake, while maintaining appropriate intake of nutrient and fiber, sodium and fats, and appropriate energy expenditure required for the weight goal.;Weight Management/Obesity: Establish reasonable short term  and long term weight goals.;Obesity: Provide education and appropriate resources to help participant work on and attain dietary goals.    Expected Outcomes Short Term: Continue to assess and modify interventions until short term weight is achieved;Long Term: Adherence to nutrition and physical activity/exercise program aimed toward attainment of established weight goal;Understanding recommendations for meals to include 15-35% energy as protein, 25-35% energy from fat, 35-60% energy from carbohydrates, less than 200mg  of dietary cholesterol, 20-35 gm of total fiber daily;Understanding of distribution of calorie intake throughout the day with the consumption of 4-5 meals/snacks    Hypertension Yes    Intervention Provide education on lifestyle modifcations including regular physical activity/exercise, weight management, moderate sodium restriction and increased consumption of fresh fruit, vegetables, and low fat dairy, alcohol moderation, and smoking cessation.;Monitor prescription use compliance.    Expected Outcomes Short Term: Continued assessment and intervention until BP is < 140/5mm HG in hypertensive participants. < 130/63mm HG in hypertensive participants with diabetes, heart failure or chronic kidney disease.;Long Term: Maintenance of blood pressure at goal levels.    Lipids Yes    Intervention Provide education and support for participant on nutrition & aerobic/resistive exercise along with prescribed medications to achieve LDL 70mg , HDL >40mg .    Expected Outcomes Short Term: Participant states understanding of desired cholesterol values and is compliant with medications prescribed. Participant is following exercise prescription and nutrition guidelines.;Long Term: Cholesterol controlled with medications as prescribed, with individualized exercise RX and with personalized nutrition plan. Value goals: LDL < 70mg , HDL > 40 mg.             Core Components/Risk Factors/Patient Goals Review:     Core Components/Risk Factors/Patient Goals at Discharge (Final Review):    ITP Comments:  ITP Comments     Row Name 08/15/23 1423           ITP Comments Dr. Armanda Magic medical director. Introduction to [facility] education/intensive cardiac rehab. Initial orientation packet reviewed with patient.                Comments: Participant attended orientation for the cardiac rehabilitation program on  08/15/2023  to perform initial intake and exercise walk test. Patient introduced to the [facility] education and orientation packet was reviewed. Completed 6-minute walk test, measurements, initial ITP, and exercise prescription. Vital signs stable. Telemetry-normal sinus rhythm, asymptomatic.   Service time was from 1320 to 1507  Jonna Coup, MS, ACSM-CEP 08/15/2023 3:47 PM

## 2023-08-17 MED ORDER — METOPROLOL TARTRATE 50 MG PO TABS: 50.0000 mg | ORAL_TABLET | Freq: Two times a day (BID) | ORAL | 3 refills | Status: AC

## 2023-08-17 MED ORDER — ATORVASTATIN CALCIUM 40 MG PO TABS: 40.0000 mg | ORAL_TABLET | Freq: Every day | ORAL | 3 refills | Status: AC

## 2023-08-20 ENCOUNTER — Encounter (HOSPITAL_COMMUNITY)
Admission: RE | Admit: 2023-08-20 | Discharge: 2023-08-20 | Disposition: A | Discharge status: Home / Self Care | Source: Ambulatory Visit | Attending: Internal Medicine | Admitting: Internal Medicine

## 2023-08-20 DIAGNOSIS — Z951 Presence of aortocoronary bypass graft: Secondary | ICD-10-CM | POA: Diagnosis not present

## 2023-08-20 NOTE — Progress Notes (Signed)
 Daily Session Note  Patient Details  Name: Isaac [name] MRN: [id number] Date of Birth: 1945/07/24 Referring Provider:   Flowsheet Row INTENSIVE CARDIAC REHAB ORIENT from 08/15/2023 in [facility]  Referring Provider Manon Hilding, MD       Encounter Date: 08/20/2023  Check In:  Session Check In - 08/20/23 1520       Check-In   Supervising physician immediately available to respond to emergencies [facility] MD immediately available    Physician(s) Edd Fabian, NP    Location [facility]    Staff Present Kary Kos, MS, Exercise Physiologist;David Manus Gunning, MS, ACSM-CEP, CCRP, Exercise Physiologist;Sullivan Jacuinde, RN, BSN;Jetta Walker BS, ACSM-CEP, Exercise Physiologist    Virtual Visit No    Medication changes reported     No    Fall or balance concerns reported    No    Tobacco Cessation No Change    Warm-up and Cool-down Performed as group-led instruction    Resistance Training Performed Yes    VAD Patient? No    PAD/SET Patient? No      Pain Assessment   Currently in Pain? No/denies    Pain Score 0-No pain    Multiple Pain Sites No             Capillary Blood Glucose: No results found for this or any previous visit (from the past 24 hours).   Exercise Prescription Changes - 08/20/23 1633       Response to Exercise   Blood Pressure (Admit) 100/60    Blood Pressure (Exercise) 120/68    Blood Pressure (Exit) 100/60    Heart Rate (Admit) 56 bpm    Heart Rate (Exercise) 92 bpm    Heart Rate (Exit) 65 bpm    Rating of Perceived Exertion (Exercise) 8.5    Symptoms 0    Comments Pt first day in teh [facility] ICR program.    Duration Progress to 30 minutes of  aerobic without signs/symptoms of physical distress    Intensity THRR unchanged      Progression   Progression Continue to progress workloads to maintain intensity without signs/symptoms of physical distress.    Average METs  2.35      Resistance Training   Training Prescription Yes    Weight 3    Reps 10-15    Time 10 Minutes      Recumbant Bike   Level 2    RPM 64    Watts 20    Minutes 15    METs 2.2      NuStep   Level 1    SPM 89    Minutes 15    METs 2.5             Social History   Tobacco Use  Smoking Status Former   Current packs/day: 0.00   Types: Cigarettes   Quit date: 03/11/1995   Years since quitting: 28.4  Smokeless Tobacco Never    Goals Met:  Exercise tolerated well No report of concerns or symptoms today Strength training completed today  Goals Unmet:  Not Applicable  Comments: Pt started cardiac rehab today.  Pt tolerated light exercise without difficulty. VSS, telemetry-Sinus Rhythm,bundle branch block, asymptomatic.  Medication list reconciled. Pt denies barriers to medicaiton compliance.  PSYCHOSOCIAL ASSESSMENT:  PHQ-0. Pt exhibits positive coping skills, hopeful outlook with supportive family. No psychosocial needs identified at this time, no psychosocial interventions necessary.    Pt  enjoys spending time with family, yard work and English as a second language teacher.   Pt oriented to exercise equipment and routine.    Understanding verbalized. Thayer Headings RN BSN    Dr. Armanda Magic is Medical Director for Cardiac Rehab at [facility].

## 2023-08-22 ENCOUNTER — Encounter (HOSPITAL_COMMUNITY)
Admission: RE | Admit: 2023-08-22 | Discharge: 2023-08-22 | Discharge status: Home / Self Care | Source: Ambulatory Visit | Attending: Internal Medicine

## 2023-08-22 DIAGNOSIS — Z951 Presence of aortocoronary bypass graft: Secondary | ICD-10-CM

## 2023-08-24 ENCOUNTER — Encounter (HOSPITAL_COMMUNITY)
Admission: RE | Admit: 2023-08-24 | Discharge: 2023-08-24 | Disposition: A | Discharge status: Home / Self Care | Source: Ambulatory Visit | Attending: Internal Medicine | Admitting: Internal Medicine

## 2023-08-24 DIAGNOSIS — Z951 Presence of aortocoronary bypass graft: Secondary | ICD-10-CM

## 2023-08-27 ENCOUNTER — Encounter (HOSPITAL_COMMUNITY)
Admission: RE | Admit: 2023-08-27 | Discharge: 2023-08-27 | Disposition: A | Discharge status: Home / Self Care | Source: Ambulatory Visit | Attending: Internal Medicine

## 2023-08-27 DIAGNOSIS — Z951 Presence of aortocoronary bypass graft: Secondary | ICD-10-CM

## 2023-08-29 ENCOUNTER — Encounter (HOSPITAL_COMMUNITY)
Admission: RE | Admit: 2023-08-29 | Discharge: 2023-08-29 | Disposition: A | Discharge status: Home / Self Care | Source: Ambulatory Visit | Attending: Internal Medicine | Admitting: Internal Medicine

## 2023-08-29 DIAGNOSIS — Z951 Presence of aortocoronary bypass graft: Secondary | ICD-10-CM | POA: Diagnosis not present

## 2023-08-30 NOTE — Progress Notes (Signed)
 Cardiac Individual Treatment Plan  Patient Details  Name: Isaac [name] MRN: [id number] Date of Birth: 1945-09-02 Referring Provider:   Flowsheet Row INTENSIVE CARDIAC REHAB ORIENT from [facility]15/2023 [facility]  Referring Provider Manon Hilding, MD       Initial Encounter Date:  Flowsheet Row INTENSIVE CARDIAC REHAB ORIENT from 08/15/2023 in [facility] for [facility], Vascular, & Lung [facility]  Date 08/15/23       Visit Diagnosis: 06/14/23 CABG x 1  Patient's Home Medications on Admission:  Current Outpatient Medications:    aspirin EC 325 MG tablet, Take 1 tablet (325 mg total) by mouth daily., Disp: , Rfl:    atorvastatin (LIPITOR) 40 MG tablet, Take 1 tablet (40 mg total) by mouth daily., Disp: 90 tablet, Rfl: 3   Ibuprofen-diphenhydrAMINE HCl (ADVIL PM) 200-25 MG CAPS, Take 2 tablets by mouth at bedtime., Disp: , Rfl:    MAGNESIUM PO, Take 1 tablet by mouth at bedtime., Disp: , Rfl:    metoprolol tartrate (LOPRESSOR) 50 MG tablet, Take 1 tablet (50 mg total) by mouth 2 (two) times daily., Disp: 180 tablet, Rfl: 3   Multiple Vitamin (MULTI VITAMIN) TABS, Take 1 tablet by mouth at bedtime., Disp: , Rfl:    traMADol (ULTRAM) 50 MG tablet, Take 1 tablet (50 mg total) by mouth every 6 (six) hours as needed for severe pain (pain score 7-10). (Patient not taking: Reported on 08/15/2023), Disp: 24 tablet, Rfl: 0   TURMERIC PO, Take 1 tablet by mouth at bedtime., Disp: , Rfl:   Past Medical History: Past Medical History:  Diagnosis Date   Abnormal fasting glucose    Acute right-sided low back pain with right-sided sciatica    Anginal pain (HCC)    Arthritis    R knee- DJD   BMI 35.0-35.9,adult    Complication of anesthesia    tachycardia with ST depression, HTN witth left TKA 04/2010 (neg cardiac w/u)   Coronary artery disease    Difficult intubation    glidescope used 04/06/10   DJD  (degenerative joint disease) of knee    Family history of bipolar disorder    Heart murmur    History of kidney stones    HLD (hyperlipidemia)    Overweight    Piriformis syndrome of right side     Tobacco Use: Social History   Tobacco Use  Smoking Status Former   Current packs/day: 0.00   Types: Cigarettes   Quit date: 03/11/1995   Years since quitting: 28.5  Smokeless Tobacco Never    Labs: Review Flowsheet  More data may exist      Latest Ref Rng & Units 04/07/2010 05/22/2023 06/13/2023 06/14/2023 08/01/2023  Labs for ITP Cardiac and Pulmonary Rehab  Cholestrol 100 - 199 mg/dL 284 (NOTE) ATP III Classification:      < 200        mg/dL        Desirable     132 - 239     mg/dL        Borderline High     >= 240        mg/dL        High   - - - 440   LDL (calc) 0 - 99 mg/dL 81 (NOTE)  Total Cholesterol/HDL Ratio:CHD Risk                       Coronary  Heart Disease Risk Table                                       Men       Women         1/2 Average Risk              3.4        3.3             Average Risk              5.0         4.4         2 X Average Risk              9.6        7.1         3 X Average Risk             23.4       11.0 Use the calculated Patient Ratio above and the CHD Risk table  to determine the patient's CHD Risk. ATP III Classification (LDL):      < 100         mg/dL         Optimal     161 - 129     mg/dL         Near or Above Optimal     130 - 159     mg/dL         Borderline High     160 - 189     mg/dL         High      > 096        mg/dL         Very High   - - - 45   Direct LDL 0 - 99 mg/dL - 89  - - -  HDL-C >04 mg/dL 43  - - - 41   Trlycerides 0 - 149 mg/dL 80  - - - 540   Hemoglobin A1c 4.8 - 5.6 % - - 5.5  - -  PH, Arterial 7.35 - 7.45 - - - 7.321  7.375  7.385  7.303  7.293  7.308  -  PCO2 arterial 32 - 48 mmHg - - - 41.9  32.4  34.5  39.2  42.5  44.4  -  Bicarbonate 20.0 - 28.0 mmol/L - - - 21.7  19.1  21.4  19.5  20.6  22.2  -  TCO2 22 - 32 mmol/L -  - - 23  20  23  21  22  22  22  21  24  23   -  Acid-base deficit 0.0 - 2.0 mmol/L - - - 4.0  6.0  4.0  6.0  6.0  4.0  -  O2 Saturation % - - - 95  95  97  99  99  100  -    Details       Multiple values from one day are sorted in reverse-chronological order         Capillary Blood Glucose: Lab Results  Component Value Date   GLUCAP 107 (H) 06/20/2023   GLUCAP 127 (H) 06/19/2023   GLUCAP 126 (H) 06/19/2023   GLUCAP 106 (H) 06/19/2023   GLUCAP 105 (H) 06/19/2023     Exercise Target Goals: Exercise Program Goal:  Individual exercise prescription set using results from initial 6 min walk test and THRR while considering  patient's activity barriers and safety.   Exercise Prescription Goal: Initial exercise prescription builds to 30-45 minutes a day of aerobic activity, 2-3 days per week.  Home exercise guidelines will be given to patient during program as part of exercise prescription that the participant will acknowledge.  Activity Barriers & Risk Stratification:  Activity Barriers & Cardiac Risk Stratification - 08/15/23 1426       Activity Barriers & Cardiac Risk Stratification   Activity Barriers Arthritis;Left Knee Replacement;Right Knee Replacement;Balance Concerns;Incisional Pain;Joint Problems;Other (comment)    Comments sternal precautions    Cardiac Risk Stratification High   <5 METs on            6 Minute Walk:  6 Minute Walk     Row Name 08/15/23 1539         6 Minute Walk   Phase Initial     Distance 1560 feet     Walk Time 6 minutes     # of Rest Breaks 0     MPH 2.95     METS 2.5     RPE 12     Perceived Dyspnea  0     VO2 Peak 8.77     Symptoms No     Resting HR 72 bpm     Resting BP 114/62     Resting Oxygen Saturation  97 %     Exercise Oxygen Saturation  during 6 min walk 97 %     Max Ex. HR 90 bpm     Max Ex. BP 138/70     2 Minute Post BP 118/68              Oxygen Initial Assessment:   Oxygen  Re-Evaluation:   Oxygen Discharge (Final Oxygen Re-Evaluation):   Initial Exercise Prescription:  Initial Exercise Prescription - 08/15/23 1500       Date of Initial Exercise RX and Referring Provider   Date 08/15/23    Referring Provider Manon Hilding, MD    Expected Discharge Date 11/07/23      Recumbant Bike   Level 1    RPM 50    Watts 30    Minutes 15    METs 2      NuStep   Level 1    SPM 70    Minutes 15    METs 2      Prescription Details   Frequency (times per week) 3    Duration Progress to 30 minutes of continuous aerobic without signs/symptoms of physical distress      Intensity   THRR 40-80% of Max Heartrate 57-117    Ratings of Perceived Exertion 11-13    Perceived Dyspnea 0-4      Progression   Progression Continue progressive overload as per policy without signs/symptoms or physical distress.      Resistance Training   Training Prescription Yes    Weight 3    Reps 10-15             Perform Capillary Blood Glucose checks as needed.  Exercise Prescription Changes:   Exercise Prescription Changes     Row Name 08/20/23 1633             Response to Exercise   Blood Pressure (Admit) 100/60       Blood Pressure (Exercise) 120/68       Blood Pressure (Exit) 100/60  Heart Rate (Admit) 56 bpm       Heart Rate (Exercise) 92 bpm       Heart Rate (Exit) 65 bpm       Rating of Perceived Exertion (Exercise) 8.5       Symptoms 0       Comments Pt first day in teh [facility] ICR program.       Duration Progress to 30 minutes of  aerobic without signs/symptoms of physical distress       Intensity THRR unchanged         Progression   Progression Continue to progress workloads to maintain intensity without signs/symptoms of physical distress.       Average METs 2.35         Resistance Training   Training Prescription Yes       Weight 3       Reps 10-15       Time 10 Minutes         Recumbant Bike   Level 2       RPM 64        Watts 20       Minutes 15       METs 2.2         NuStep   Level 1       SPM 89       Minutes 15       METs 2.5                Exercise Comments:   Exercise Comments     Row Name 08/20/23 1638           Exercise Comments Pt first day in the [facility] ICR program. Pt toleraetd exercise well with an average MET level of 2.35. Pt is leanring his THRR, RPE and ExRx. He's off to a great start                Exercise Goals and Review:   Exercise Goals     Row Name 08/15/23 1426             Exercise Goals   Increase Physical Activity Yes       Intervention Provide advice, education, support and counseling about physical activity/exercise needs.;Develop an individualized exercise prescription for aerobic and resistive training based on initial evaluation findings, risk stratification, comorbidities and participant's personal goals.       Expected Outcomes Short Term: Attend rehab on a regular basis to increase amount of physical activity.;Long Term: Exercising regularly at least 3-5 days a week.;Long Term: Add in home exercise to make exercise part of routine and to increase amount of physical activity.       Increase Strength and Stamina Yes       Intervention Provide advice, education, support and counseling about physical activity/exercise needs.;Develop an individualized exercise prescription for aerobic and resistive training based on initial evaluation findings, risk stratification, comorbidities and participant's personal goals.       Expected Outcomes Short Term: Increase workloads from initial exercise prescription for resistance, speed, and METs.;Short Term: Perform resistance training exercises routinely during rehab and add in resistance training at home;Long Term: Improve cardiorespiratory fitness, muscular endurance and strength as measured by increased METs and functional capacity ( )       Able to understand and use rate of perceived exertion (RPE) scale  Yes       Intervention Provide education and explanation on how to use RPE scale  Expected Outcomes Short Term: Able to use RPE daily in rehab to express subjective intensity level;Long Term:  Able to use RPE to guide intensity level when exercising independently       Knowledge and understanding of Target Heart Rate Range (THRR) Yes       Intervention Provide education and explanation of THRR including how the numbers were predicted and where they are located for reference       Expected Outcomes Short Term: Able to state/look up THRR;Short Term: Able to use daily as guideline for intensity in rehab;Long Term: Able to use THRR to govern intensity when exercising independently       Understanding of Exercise Prescription Yes       Intervention Provide education, explanation, and written materials on patient's individual exercise prescription       Expected Outcomes Short Term: Able to explain program exercise prescription;Long Term: Able to explain home exercise prescription to exercise independently                Exercise Goals Re-Evaluation :  Exercise Goals Re-Evaluation     Row Name 08/20/23 1637             Exercise Goal Re-Evaluation   Exercise Goals Review Increase Physical Activity;Understanding of Exercise Prescription;Increase Strength and Stamina;Knowledge and understanding of Target Heart Rate Range (THRR);Able to understand and use rate of perceived exertion (RPE) scale       Comments Pt first day in the [facility] ICR program. Pt toleraetd exercise well with an average MET level of 2.35. Pt is leanring his THRR, RPE and ExRx. He's off to a great start       Expected Outcomes Will continue to monitor pt and progress workloads as tolerated without sign or symptom                Discharge Exercise Prescription (Final Exercise Prescription Changes):  Exercise Prescription Changes - 08/20/23 1633       Response to Exercise   Blood Pressure (Admit) 100/60     Blood Pressure (Exercise) 120/68    Blood Pressure (Exit) 100/60    Heart Rate (Admit) 56 bpm    Heart Rate (Exercise) 92 bpm    Heart Rate (Exit) 65 bpm    Rating of Perceived Exertion (Exercise) 8.5    Symptoms 0    Comments Pt first day in teh [facility] ICR program.    Duration Progress to 30 minutes of  aerobic without signs/symptoms of physical distress    Intensity THRR unchanged      Progression   Progression Continue to progress workloads to maintain intensity without signs/symptoms of physical distress.    Average METs 2.35      Resistance Training   Training Prescription Yes    Weight 3    Reps 10-15    Time 10 Minutes      Recumbant Bike   Level 2    RPM 64    Watts 20    Minutes 15    METs 2.2      NuStep   Level 1    SPM 89    Minutes 15    METs 2.5             Nutrition:  Target Goals: Understanding of nutrition guidelines, daily intake of sodium 1500mg , cholesterol 200mg , calories 30% from fat and 7% or less from saturated fats, daily to have 5 or more servings of fruits and vegetables.  Biometrics:  Pre Biometrics -  08/15/23 1424       Pre Biometrics   Waist Circumference 44 inches    Hip Circumference 45 inches    Waist to Hip Ratio 0.98 %    Triceps Skinfold 18 mm    % Body Fat 32.8 %    Grip Strength 32 kg    Flexibility 0 in   could not reach   Single Leg Stand 4.56 seconds              Nutrition Therapy Plan and Nutrition Goals:  Nutrition Therapy & Goals - 08/21/23 1406       Nutrition Therapy   Diet Heart healthy diet    Drug/Food Interactions Statins/Certain Fruits      Personal Nutrition Goals   Nutrition Goal Patient to identify strategies for reducing cardiovascular risk by attending the [facility] education and nutrition series weekly.    Personal Goal #2 Patient to improve diet quality by using the plate method as a guide for meal planning to include lean protein/plant protein, fruits, vegetables, whole grains,  nonfat dairy as part of a well-balanced diet.    Comments Claron has medical history of CABGx1, HTN, hyperlipidemia. Has started making some changes including reduced sodium and reduced processed meats. His LDL is at goal <55. Patient will benefit from participation in intensive cardiac rehab for nutrition, exercise, and lifestyle modification.      Intervention Plan   Intervention Prescribe, educate and counsel regarding individualized specific dietary modifications aiming towards targeted core components such as weight, hypertension, lipid management, diabetes, heart failure and other comorbidities.;Nutrition handout(s) given to patient.    Expected Outcomes Short Term Goal: Understand basic principles of dietary content, such as calories, fat, sodium, cholesterol and nutrients.;Long Term Goal: Adherence to prescribed nutrition plan.             Nutrition Assessments:  Nutrition Assessments - 08/21/23 1527       Rate Your Plate Scores   Pre Score 49            MEDIFICTS Score Key: >=70 Need to make dietary changes  40-70 Heart Healthy Diet <= 40 Therapeutic Level Cholesterol Diet   Flowsheet Row INTENSIVE CARDIAC REHAB from 08/20/2023 in [facility], V[facility], & Lung Health  Picture Your Plate Total Score on Admission 49      Picture Your Plate Scores: <16 Unhealthy dietary pattern with much room for improvement. 41-50 Dietary pattern unlikely to meet recommendations for good health and room for improvement. 51-60 More healthful dietary pattern, with some room for improvement.  >60 Healthy dietary pattern, although there may be some specific behaviors that could be improved.    Nutrition Goals Re-Evaluation:  Nutrition Goals Re-Evaluation     Row Name 08/21/23 1406             Goals   Current Weight 222 lb 10.6 oz (101 kg)       Comment lipids WNL, LDL <55 (45), Lpa WNL, A1c WNL       Expected Outcome Isaac [name] has medical history  of CABGx1, HTN, hyperlipidemia. Has started making some changes including reduced sodium and reduced processed meats. His LDL is at goal <55. Patient will benefit from participation in intensive cardiac rehab for nutrition, exercise, and lifestyle modification.                Nutrition Goals Re-Evaluation:  Nutrition Goals Re-Evaluation     Row Name 08/21/23 1406  Goals   Current Weight 222 lb 10.6 oz (101 kg)       Comment lipids WNL, LDL <55 (45), Lpa WNL, A1c WNL       Expected Outcome Isaac [name] has medical history of CABGx1, HTN, hyperlipidemia. Has started making some changes including reduced sodium and reduced processed meats. His LDL is at goal <55. Patient will benefit from participation in intensive cardiac rehab for nutrition, exercise, and lifestyle modification.                Nutrition Goals Discharge (Final Nutrition Goals Re-Evaluation):  Nutrition Goals Re-Evaluation - 08/21/23 1406       Goals   Current Weight 222 lb 10.6 oz (101 kg)    Comment lipids WNL, LDL <55 (45), Lpa WNL, A1c WNL    Expected Outcome Isaac [name] has medical history of CABGx1, HTN, hyperlipidemia. Has started making some changes including reduced sodium and reduced processed meats. His LDL is at goal <55. Patient will benefit from participation in intensive cardiac rehab for nutrition, exercise, and lifestyle modification.             Psychosocial: Target Goals: Acknowledge presence or absence of significant depression and/or stress, maximize coping skills, provide positive support system. Participant is able to verbalize types and ability to use techniques and skills needed for reducing stress and depression.  Initial Review & Psychosocial Screening:  Initial Psych Review & Screening - 08/15/23 1426       Initial Review   Current issues with None Identified      Family Dynamics   Good Support System? Yes   Daughter for support   Comments Isaac [name] denies any feelings of  anxiety/stress/depression. He said he feels worried occasionally due to having other blockages that they couldn't fix with previous CABG but he plans to discuss this with his cardiologist in April. 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      Barriers   Psychosocial barriers to participate in program There are no identifiable barriers or psychosocial needs.      Screening Interventions   Interventions Encouraged to exercise;Provide feedback about the scores to participant    Expected Outcomes Short Term goal: Identification and review with participant of any Quality of Life or Depression concerns found by scoring the questionnaire.;Long Term goal: The participant improves quality of Life and PHQ9 Scores as seen by post scores and/or verbalization of changes             Quality of Life Scores:  Quality of Life - 08/15/23 1444       Quality of Life   Select Quality of Life      Quality of Life Scores   Health/Function Pre 29.2 %    Socioeconomic Pre 30 %    Psych/Spiritual Pre 30 %    Family Pre 28.13 %    GLOBAL Pre 29.41 %            Scores of 19 and below usually indicate a poorer quality of life in these areas.  A difference of  2-3 points is a clinically meaningful difference.  A difference of 2-3 points in the total score of the Quality of Life Index has been associated with significant improvement in overall quality of life, self-image, physical symptoms, and general health in studies assessing change in quality of life.  PHQ-9: Review Flowsheet       08/15/2023  Depression screen PHQ 2/9  Decreased Interest 0  Down, Depressed, Hopeless 0  PHQ - 2 Score 0  Altered sleeping 0  Tired, decreased energy 0  Change in appetite 0  Feeling bad or failure about yourself  0  Trouble concentrating 0  Moving slowly or fidgety/restless 0  Suicidal thoughts 0  PHQ-9 Score 0   Interpretation of Total Score  Total Score Depression Severity:  1-4 = Minimal  depression, 5-9 = Mild depression, 10-14 = Moderate depression, 15-19 = Moderately severe depression, 20-27 = Severe depression   Psychosocial Evaluation and Intervention:   Psychosocial Re-Evaluation:  Psychosocial Re-Evaluation     Row Name 08/24/23 1549 08/24/23 1550           Psychosocial Re-Evaluation   Current issues with None Identified None Identified      Interventions Encouraged to attend Cardiac Rehabilitation for the exercise Encouraged to attend Cardiac Rehabilitation for the exercise      Continue Psychosocial Services  No Follow up required No Follow up required               Psychosocial Discharge (Final Psychosocial Re-Evaluation):  Psychosocial Re-Evaluation - 08/24/23 1550       Psychosocial Re-Evaluation   Current issues with None Identified    Interventions Encouraged to attend Cardiac Rehabilitation for the exercise    Continue Psychosocial Services  No Follow up required             Vocational Rehabilitation: Provide vocational rehab assistance to qualifying candidates.   Vocational Rehab Evaluation & Intervention:  Vocational Rehab - 08/15/23 1430       Initial Vocational Rehab Evaluation & Intervention   Assessment shows need for Vocational Rehabilitation No   Isaac [name] is retired            Education: Education Goals: Education classes will be provided on a weekly basis, covering required topics. Participant will state understanding/return demonstration of topics presented.    Education     Row Name 08/20/23 1600     Education   Cardiac Education Topics [facility]in   Glass blower/designer Nutrition   Nutrition Workshop Targeting Your Nutrition Priorities   Instruction Review Code 1- Verbalizes Understanding   Class Start Time 1400   Class Stop Time 1440   Class Time Calculation (min) 40 min    Row Name 08/22/23 1500     Education   Cardiac Education Topics [facility]   Hydrologist   Educator Dietitian   Weekly Topic One-Pot Wonders   Instruction Review Code 1- Verbalizes Understanding   Class Start Time 1400   Class Stop Time 1445   Class Time Calculation (min) 45 min    Row Name 08/24/23 1300     Education   Cardiac Education Topics [facility]in   Hospital doctor Education   General Education Hypertension and Heart Disease   Instruction Review Code 1- Verbalizes Understanding   Class Start Time 1405   Class Stop Time 1455   Class Time Calculation (min) 50 min    Row Name 08/27/23 1600     Education   Cardiac Education Topics [facility]in   Geographical information systems officer Psychosocial   Psychosocial Workshop Focused Goals, Sustainable Changes   Instruction Review Code 1- Verbalizes Understanding   Class Start Time 1345   Class  Stop Time 1443   Class Time Calculation (min) 58 min    Row Name 08/29/23 1600     Education   Cardiac Education Topics [facility]in   Customer service manager   Weekly Topic Comforting Weekend Breakfasts   Instruction Review Code 1- Verbalizes Understanding   Class Start Time 1400   Class Stop Time 1440   Class Time Calculation (min) 40 min            Core Videos: Exercise    Move It!  Clinical staff conducted group or individual video education with verbal and written material and guidebook.  Patient learns the recommended Pritikin exercise program. Exercise with the goal of living a long, healthy life. Some of the health benefits of exercise include controlled diabetes, healthier blood pressure levels, improved cholesterol levels, improved heart and lung capacity, improved sleep, and better body composition. Everyone should speak with their doctor before starting or changing an exercise routine.  Biomechanical Limitations Clinical staff  conducted group or individual video education with verbal and written material and guidebook.  Patient learns how biomechanical limitations can impact exercise and how we can mitigate and possibly overcome limitations to have an impactful and balanced exercise routine.  Body Composition Clinical staff conducted group or individual video education with verbal and written material and guidebook.  Patient learns that body composition (ratio of muscle mass to fat mass) is a key component to assessing overall fitness, rather than body weight alone. Increased fat mass, especially visceral belly fat, can put [place] at increased risk for metabolic syndrome, type 2 diabetes, heart disease, and even death. It is recommended to combine diet and exercise (cardiovascular and resistance training) to improve your body composition. Seek guidance from your physician and exercise physiologist before implementing an exercise routine.  Exercise Action Plan Clinical staff conducted group or individual video education with verbal and written material and guidebook.  Patient learns the recommended strategies to achieve and enjoy long-term exercise adherence, including variety, self-motivation, self-efficacy, and positive decision making. Benefits of exercise include fitness, good health, weight management, more energy, better sleep, less stress, and overall well-being.  Medical   Heart Disease Risk Reduction Clinical staff conducted group or individual video education with verbal and written material and guidebook.  Patient learns our heart is our most vital organ as it circulates oxygen, nutrients, white blood cells, and hormones throughout the entire body, and carries waste away. Data supports a plant-based eating plan like the Pritikin Program for its effectiveness in slowing progression of and reversing heart disease. The video provides a number of recommendations to address heart disease.   Metabolic Syndrome and Belly  Fat  Clinical staff conducted group or individual video education with verbal and written material and guidebook.  Patient learns what metabolic syndrome is, how it leads to heart disease, and how one can reverse it and keep it from coming back. You have metabolic syndrome if you have 3 of the following 5 criteria: abdominal obesity, high blood pressure, high triglycerides, low HDL cholesterol, and high blood sugar.  Hypertension and Heart Disease Clinical staff conducted group or individual video education with verbal and written material and guidebook.  Patient learns that high blood pressure, or hypertension, is very common in the [place]. Hypertension is largely due to excessive salt intake, but other important risk factors include being overweight, physical inactivity, drinking too much alcohol, smoking, and not eating enough potassium from fruits and  vegetables. High blood pressure is a leading risk factor for heart attack, stroke, congestive heart failure, dementia, kidney failure, and premature death. Long-term effects of excessive salt intake include stiffening of the arteries and thickening of heart muscle and organ damage. Recommendations include ways to reduce hypertension and the risk of heart disease.  Diseases of Our Time - Focusing on Diabetes Clinical staff conducted group or individual video education with verbal and written material and guidebook.  Patient learns why the best way to stop diseases of our time is prevention, through food and other lifestyle changes. Medicine (such as prescription pills and surgeries) is often only a Band-Aid on the problem, not a long-term solution. Most common diseases of our time include obesity, type 2 diabetes, hypertension, heart disease, and cancer. The Pritikin Program is recommended and has been proven to help reduce, reverse, and/or prevent the damaging effects of metabolic syndrome.  Nutrition   Overview of the Pritikin Eating Plan   Clinical staff conducted group or individual video education with verbal and written material and guidebook.  Patient learns about the Pritikin Eating Plan for disease risk reduction. The Pritikin Eating Plan emphasizes a wide variety of unrefined, minimally-processed carbohydrates, like fruits, vegetables, whole grains, and legumes. Go, Caution, and Stop food choices are explained. Plant-based and lean animal proteins are emphasized. Rationale provided for low sodium intake for blood pressure control, low added sugars for blood sugar stabilization, and low added fats and oils for coronary artery disease risk reduction and weight management.  Calorie Density  Clinical staff conducted group or individual video education with verbal and written material and guidebook.  Patient learns about calorie density and how it impacts the Pritikin Eating Plan. Knowing the characteristics of the food you choose will help you decide whether those foods will lead to weight gain or weight loss, and whether you want to consume more or less of them. Weight loss is usually a side effect of the Pritikin Eating Plan because of its focus on low calorie-dense foods.  Label Reading  Clinical staff conducted group or individual video education with verbal and written material and guidebook.  Patient learns about the Pritikin recommended label reading guidelines and corresponding recommendations regarding calorie density, added sugars, sodium content, and whole grains.  Dining Out - Part 1  Clinical staff conducted group or individual video education with verbal and written material and guidebook.  Patient learns that restaurant meals can be sabotaging because they can be so high in calories, fat, sodium, and/or sugar. Patient learns recommended strategies on how to positively address this and avoid unhealthy pitfalls.  Facts on Fats  Clinical staff conducted group or individual video education with verbal and written  material and guidebook.  Patient learns that lifestyle modifications can be just as effective, if not more so, as many medications for lowering your risk of heart disease. A Pritikin lifestyle can help to reduce your risk of inflammation and atherosclerosis (cholesterol build-up, or plaque, in the artery walls). Lifestyle interventions such as dietary choices and physical activity address the cause of atherosclerosis. A review of the types of fats and their impact on blood cholesterol levels, along with dietary recommendations to reduce fat intake is also included.  Nutrition Action Plan  Clinical staff conducted group or individual video education with verbal and written material and guidebook.  Patient learns how to incorporate Pritikin recommendations into their lifestyle. Recommendations include planning and keeping personal health goals in mind as an important part of their success.  Healthy Mind-Set    Healthy Minds, Bodies, Hearts  Clinical staff conducted group or individual video education with verbal and written material and guidebook.  Patient learns how to identify when they are stressed. Video will discuss the impact of that stress, as well as the many benefits of stress management. Patient will also be introduced to stress management techniques. The way we think, act, and feel has an impact on our hearts.  How Our Thoughts Can Heal Our Hearts  Clinical staff conducted group or individual video education with verbal and written material and guidebook.  Patient learns that negative thoughts can cause depression and anxiety. This can result in negative lifestyle behavior and serious health problems. Cognitive behavioral therapy is an effective method to help control our thoughts in order to change and improve our emotional outlook.  Additional Videos:  Exercise    Improving Performance  Clinical staff conducted group or individual video education with verbal and written material and  guidebook.  Patient learns to use a non-linear approach by alternating intensity levels and lengths of time spent exercising to help burn more calories and lose more body fat. Cardiovascular exercise helps improve heart health, metabolism, hormonal balance, blood sugar control, and recovery from fatigue. Resistance training improves strength, endurance, balance, coordination, reaction time, metabolism, and muscle mass. Flexibility exercise improves circulation, posture, and balance. Seek guidance from your physician and exercise physiologist before implementing an exercise routine and learn your capabilities and proper form for all exercise.  Introduction to Yoga  Clinical staff conducted group or individual video education with verbal and written material and guidebook.  Patient learns about yoga, a discipline of the coming together of mind, breath, and body. The benefits of yoga include improved flexibility, improved range of motion, better posture and core strength, increased lung function, weight loss, and positive self-image. Yoga's heart health benefits include lowered blood pressure, healthier heart rate, decreased cholesterol and triglyceride levels, improved immune function, and reduced stress. Seek guidance from your physician and exercise physiologist before implementing an exercise routine and learn your capabilities and proper form for all exercise.  Medical   Aging: Enhancing Your Quality of Life  Clinical staff conducted group or individual video education with verbal and written material and guidebook.  Patient learns key strategies and recommendations to stay in good physical health and enhance quality of life, such as prevention strategies, having an advocate, securing a Health Care Proxy and Power of Attorney, and keeping a list of medications and system for tracking them. It also discusses how to avoid risk for bone loss.  Biology of Weight Control  Clinical staff conducted group or  individual video education with verbal and written material and guidebook.  Patient learns that weight gain occurs because we consume more calories than we burn (eating more, moving less). Even if your body weight is normal, you may have higher ratios of fat compared to muscle mass. Too much body fat puts you at increased risk for cardiovascular disease, heart attack, stroke, type 2 diabetes, and obesity-related cancers. In addition to exercise, following the Pritikin Eating Plan can help reduce your risk.  Decoding Lab Results  Clinical staff conducted group or individual video education with verbal and written material and guidebook.  Patient learns that lab test reflects one measurement whose values change over time and are influenced by many factors, including medication, stress, sleep, exercise, food, hydration, pre-existing medical conditions, and more. It is recommended to use the knowledge from this video to become more  involved with your lab results and evaluate your numbers to speak with your doctor.   Diseases of Our Time - Overview  Clinical staff conducted group or individual video education with verbal and written material and guidebook.  Patient learns that according to the CDC, 50% to 70% of chronic diseases (such as obesity, type 2 diabetes, elevated lipids, hypertension, and heart disease) are avoidable through lifestyle improvements including healthier food choices, listening to satiety cues, and increased physical activity.  Sleep Disorders Clinical staff conducted group or individual video education with verbal and written material and guidebook.  Patient learns how good quality and duration of sleep are important to overall health and well-being. Patient also learns about sleep disorders and how they impact health along with recommendations to address them, including discussing with a physician.  Nutrition  Dining Out - Part 2 Clinical staff conducted group or individual video  education with verbal and written material and guidebook.  Patient learns how to plan ahead and communicate in order to maximize their dining experience in a healthy and nutritious manner. Included are recommended food choices based on the type of restaurant the patient is visiting.   Fueling a Banker conducted group or individual video education with verbal and written material and guidebook.  There is a strong connection between our food choices and our health. Diseases like obesity and type 2 diabetes are very prevalent and are in large-part due to lifestyle choices. The Pritikin Eating Plan provides plenty of food and hunger-curbing satisfaction. It is easy to follow, affordable, and helps reduce health risks.  Menu Workshop  Clinical staff conducted group or individual video education with verbal and written material and guidebook.  Patient learns that restaurant meals can sabotage health goals because they are often packed with calories, fat, sodium, and sugar. Recommendations include strategies to plan ahead and to communicate with the manager, chef, or server to help order a healthier meal.  Planning Your Eating Strategy  Clinical staff conducted group or individual video education with verbal and written material and guidebook.  Patient learns about the Pritikin Eating Plan and its benefit of reducing the risk of disease. The Pritikin Eating Plan does not focus on calories. Instead, it emphasizes high-quality, nutrient-rich foods. By knowing the characteristics of the foods, we choose, we can determine their calorie density and make informed decisions.  Targeting Your Nutrition Priorities  Clinical staff conducted group or individual video education with verbal and written material and guidebook.  Patient learns that lifestyle habits have a tremendous impact on disease risk and progression. This video provides eating and physical activity recommendations based on your  personal health goals, such as reducing LDL cholesterol, losing weight, preventing or controlling type 2 diabetes, and reducing high blood pressure.  Vitamins and Minerals  Clinical staff conducted group or individual video education with verbal and written material and guidebook.  Patient learns different ways to obtain key vitamins and minerals, including through a recommended healthy diet. It is important to discuss all supplements you take with your doctor.   Healthy Mind-Set    Smoking Cessation  Clinical staff conducted group or individual video education with verbal and written material and guidebook.  Patient learns that cigarette smoking and tobacco addiction pose a serious health risk which affects millions of people. Stopping smoking will significantly reduce the risk of heart disease, lung disease, and many forms of cancer. Recommended strategies for quitting are covered, including working with your doctor to develop a  successful plan.  Culinary   Becoming a Set designer conducted group or individual video education with verbal and written material and guidebook.  Patient learns that cooking at home can be healthy, cost-effective, quick, and puts them in control. Keys to cooking healthy recipes will include looking at your recipe, assessing your equipment needs, planning ahead, making it simple, choosing cost-effective seasonal ingredients, and limiting the use of added fats, salts, and sugars.  Cooking - Breakfast and Snacks  Clinical staff conducted group or individual video education with verbal and written material and guidebook.  Patient learns how important breakfast is to satiety and nutrition through the entire day. Recommendations include key foods to eat during breakfast to help stabilize blood sugar levels and to prevent overeating at meals later in the day. Planning ahead is also a key component.  Cooking - Educational psychologist conducted  group or individual video education with verbal and written material and guidebook.  Patient learns eating strategies to improve overall health, including an approach to cook more at home. Recommendations include thinking of animal protein as a side on your plate rather than center stage and focusing instead on lower calorie dense options like vegetables, fruits, whole grains, and plant-based proteins, such as beans. Making sauces in large quantities to freeze for later and leaving the skin on your vegetables are also recommended to maximize your experience.  Cooking - Healthy Salads and Dressing Clinical staff conducted group or individual video education with verbal and written material and guidebook.  Patient learns that vegetables, fruits, whole grains, and legumes are the foundations of the Pritikin Eating Plan. Recommendations include how to incorporate each of these in flavorful and healthy salads, and how to create homemade salad dressings. Proper handling of ingredients is also covered. Cooking - Soups and [employer] - Soups and Desserts Clinical staff conducted group or individual video education with verbal and written material and guidebook.  Patient learns that Pritikin soups and desserts make for easy, nutritious, and delicious snacks and meal components that are low in sodium, fat, sugar, and calorie density, while high in vitamins, minerals, and filling fiber. Recommendations include simple and healthy ideas for soups and desserts.   Overview     The Pritikin Solution Program Overview Clinical staff conducted group or individual video education with verbal and written material and guidebook.  Patient learns that the results of the Pritikin Program have been documented in more than 100 articles published in peer-reviewed journals, and the benefits include reducing risk factors for (and, in some cases, even reversing) high cholesterol, high blood pressure, type 2 diabetes, obesity,  and more! An overview of the three key pillars of the Pritikin Program will be covered: eating well, doing regular exercise, and having a healthy mind-set.  WORKSHOPS  Exercise: Exercise Basics: Building Your Action Plan Clinical staff led group instruction and group discussion with PowerPoint presentation and patient guidebook. To enhance the learning environment the use of posters, models and videos may be added. At the conclusion of this workshop, patients will comprehend the difference between physical activity and exercise, as well as the benefits of incorporating both, into their routine. Patients will understand the FITT (Frequency, Intensity, Time, and Type) principle and how to use it to build an exercise action plan. In addition, safety concerns and other considerations for exercise and cardiac rehab will be addressed by the presenter. The purpose of this lesson is to promote a comprehensive and effective weekly  exercise routine in order to improve patients' overall level of fitness.   Managing Heart Disease: Your Path to a Healthier Heart Clinical staff led group instruction and group discussion with PowerPoint presentation and patient guidebook. To enhance the learning environment the use of posters, models and videos may be added.At the conclusion of this workshop, patients will understand the anatomy and physiology of the heart. Additionally, they will understand how Pritikin's three pillars impact the risk factors, the progression, and the management of heart disease.  The purpose of this lesson is to provide a high-level overview of the heart, heart disease, and how the Pritikin lifestyle positively impacts risk factors.  Exercise Biomechanics Clinical staff led group instruction and group discussion with PowerPoint presentation and patient guidebook. To enhance the learning environment the use of posters, models and videos may be added. Patients will learn how the structural  parts of their bodies function and how these functions impact their daily activities, movement, and exercise. Patients will learn how to promote a neutral spine, learn how to manage pain, and identify ways to improve their physical movement in order to promote healthy living. The purpose of this lesson is to expose patients to common physical limitations that impact physical activity. Participants will learn practical ways to adapt and manage aches and pains, and to minimize their effect on regular exercise. Patients will learn how to maintain good posture while sitting, walking, and lifting.  Balance Training and Fall Prevention  Clinical staff led group instruction and group discussion with PowerPoint presentation and patient guidebook. To enhance the learning environment the use of posters, models and videos may be added. At the conclusion of this workshop, patients will understand the importance of their sensorimotor skills (vision, proprioception, and the vestibular system) in maintaining their ability to balance as they age. Patients will apply a variety of balancing exercises that are appropriate for their current level of function. Patients will understand the common causes for poor balance, possible solutions to these problems, and ways to modify their physical environment in order to minimize their fall risk. The purpose of this lesson is to teach patients about the importance of maintaining balance as they age and ways to minimize their risk of falling.  WORKSHOPS   Nutrition:  Fueling a Ship broker led group instruction and group discussion with PowerPoint presentation and patient guidebook. To enhance the learning environment the use of posters, models and videos may be added. Patients will review the foundational principles of the Pritikin Eating Plan and understand what constitutes a serving size in each of the food groups. Patients will also learn Pritikin-friendly  foods that are better choices when away from home and review make-ahead meal and snack options. Calorie density will be reviewed and applied to three nutrition priorities: weight maintenance, weight loss, and weight gain. The purpose of this lesson is to reinforce (in a group setting) the key concepts around what patients are recommended to eat and how to apply these guidelines when away from home by planning and selecting Pritikin-friendly options. Patients will understand how calorie density may be adjusted for different weight management goals.  Mindful Eating  Clinical staff led group instruction and group discussion with PowerPoint presentation and patient guidebook. To enhance the learning environment the use of posters, models and videos may be added. Patients will briefly review the concepts of the Pritikin Eating Plan and the importance of low-calorie dense foods. The concept of mindful eating will be introduced as well as the  importance of paying attention to internal hunger signals. Triggers for non-hunger eating and techniques for dealing with triggers will be explored. The purpose of this lesson is to provide patients with the opportunity to review the basic principles of the Pritikin Eating Plan, discuss the value of eating mindfully and how to measure internal cues of hunger and fullness using the Hunger Scale. Patients will also discuss reasons for non-hunger eating and learn strategies to use for controlling emotional eating.  Targeting Your Nutrition Priorities Clinical staff led group instruction and group discussion with PowerPoint presentation and patient guidebook. To enhance the learning environment the use of posters, models and videos may be added. Patients will learn how to determine their genetic susceptibility to disease by reviewing their family history. Patients will gain insight into the importance of diet as part of an overall healthy lifestyle in mitigating the impact of  genetics and other environmental insults. The purpose of this lesson is to provide patients with the opportunity to assess their personal nutrition priorities by looking at their family history, their own health history and current risk factors. Patients will also be able to discuss ways of prioritizing and modifying the Pritikin Eating Plan for their highest risk areas  Menu  Clinical staff led group instruction and group discussion with PowerPoint presentation and patient guidebook. To enhance the learning environment the use of posters, models and videos may be added. Using menus brought in from [employer], or printed from [employer], patients will apply the Pritikin dining out guidelines that were presented in the [employer] video. Patients will also be able to practice these guidelines in a variety of provided scenarios. The purpose of this lesson is to provide patients with the opportunity to practice hands-on learning of the Pritikin Dining Out guidelines with actual menus and practice scenarios.  Label Reading Clinical staff led group instruction and group discussion with PowerPoint presentation and patient guidebook. To enhance the learning environment the use of posters, models and videos may be added. Patients will review and discuss the Pritikin label reading guidelines presented in Pritikin's Label Reading Educational series video. Using fool labels brought in from local grocery stores and markets, patients will apply the label reading guidelines and determine if the packaged food meet the Pritikin guidelines. The purpose of this lesson is to provide patients with the opportunity to review, discuss, and practice hands-on learning of the Pritikin Label Reading guidelines with actual packaged food labels. Cooking School  Pritikin's [employer] are designed to teach patients ways to prepare quick, simple, and affordable recipes at home. The importance of  nutrition's role in chronic disease risk reduction is reflected in its emphasis in the overall Pritikin program. By learning how to prepare essential core Pritikin Eating Plan recipes, patients will increase control over what they eat; be able to customize the flavor of foods without the use of added salt, sugar, or fat; and improve the quality of the food they consume. By learning a set of core recipes which are easily assembled, quickly prepared, and affordable, patients are more likely to prepare more healthy foods at home. These workshops focus on convenient breakfasts, simple entres, side dishes, and desserts which can be prepared with minimal effort and are consistent with nutrition recommendations for cardiovascular risk reduction. Cooking [employer] are taught by a Armed forces logistics/support/administrative officer (RD) who has been trained by the [employer]. The chef or RD has a clear understanding of the importance of  minimizing - if not completely eliminating - added fat, sugar, and sodium in recipes. Throughout the series of Cooking School Workshop sessions, patients will learn about healthy ingredients and efficient methods of cooking to build confidence in their capability to prepare    Cooking School weekly topics:  Adding Flavor- Sodium-Free  Fast and Healthy Breakfasts  Powerhouse Plant-Based Proteins  Satisfying Salads and Dressings  Simple Sides and Sauces  International Cuisine-Spotlight on the [employer] Zones  Delicious Desserts  Savory Soups  [employer] - Meals in a Astronomer Appetizers and Snacks  Comforting Weekend Breakfasts  One-Pot Wonders   Fast Evening Meals  Landscape architect Your Pritikin Plate  WORKSHOPS   Healthy Mindset (Psychosocial):  Focused Goals, Sustainable Changes Clinical staff led group instruction and group discussion with PowerPoint presentation and patient guidebook. To enhance the learning environment the use of posters,  models and videos may be added. Patients will be able to apply effective goal setting strategies to establish at least one personal goal, and then take consistent, meaningful action toward that goal. They will learn to identify common barriers to achieving personal goals and develop strategies to overcome them. Patients will also gain an understanding of how our mind-set can impact our ability to achieve goals and the importance of cultivating a positive and growth-oriented mind-set. The purpose of this lesson is to provide patients with a deeper understanding of how to set and achieve personal goals, as well as the tools and strategies needed to overcome common obstacles which may arise along the way.  From Head to Heart: The Power of a Healthy Outlook  Clinical staff led group instruction and group discussion with PowerPoint presentation and patient guidebook. To enhance the learning environment the use of posters, models and videos may be added. Patients will be able to recognize and describe the impact of emotions and mood on physical health. They will discover the importance of self-care and explore self-care practices which may work for them. Patients will also learn how to utilize the 4 C's to cultivate a healthier outlook and better manage stress and challenges. The purpose of this lesson is to demonstrate to patients how a healthy outlook is an essential part of maintaining good health, especially as they continue their cardiac rehab journey.  Healthy Sleep for a Healthy Heart Clinical staff led group instruction and group discussion with PowerPoint presentation and patient guidebook. To enhance the learning environment the use of posters, models and videos may be added. At the conclusion of this workshop, patients will be able to demonstrate knowledge of the importance of sleep to overall health, well-being, and quality of life. They will understand the symptoms of, and treatments for, common sleep  disorders. Patients will also be able to identify daytime and nighttime behaviors which impact sleep, and they will be able to apply these tools to help manage sleep-related challenges. The purpose of this lesson is to provide patients with a general overview of sleep and outline the importance of quality sleep. Patients will learn about a few of the most common sleep disorders. Patients will also be introduced to the concept of "sleep hygiene," and discover ways to self-manage certain sleeping problems through simple daily behavior changes. Finally, the workshop will motivate patients by clarifying the links between quality sleep and their goals of heart-healthy living.   Recognizing and Reducing Stress Clinical staff led group instruction and group discussion with PowerPoint presentation and patient guidebook. To enhance the learning environment the use  of posters, models and videos may be added. At the conclusion of this workshop, patients will be able to understand the types of stress reactions, differentiate between acute and chronic stress, and recognize the impact that chronic stress has on their health. They will also be able to apply different coping mechanisms, such as reframing negative self-talk. Patients will have the opportunity to practice a variety of stress management techniques, such as deep abdominal breathing, progressive muscle relaxation, and/or guided imagery.  The purpose of this lesson is to educate patients on the role of stress in their lives and to provide healthy techniques for coping with it.  Learning Barriers/Preferences:  Learning Barriers/Preferences - 08/15/23 1430       Learning Barriers/Preferences   Learning Barriers None    Learning Preferences Audio;Group Instruction;Individual Instruction;Pictoral;Skilled Demonstration;Verbal Instruction;Written Material;Video             Education Topics:  Knowledge Questionnaire Score:  Knowledge Questionnaire Score -  08/15/23 1431       Knowledge Questionnaire Score   Pre Score 22/24             Core Components/Risk Factors/Patient Goals at Admission:  Personal Goals and Risk Factors at Admission - 08/15/23 1430       Core Components/Risk Factors/Patient Goals on Admission    Weight Management Yes;Obesity    Intervention Weight Management: Provide education and appropriate resources to help participant work on and attain dietary goals.;Weight Management: Develop a combined nutrition and exercise program designed to reach desired caloric intake, while maintaining appropriate intake of nutrient and fiber, sodium and fats, and appropriate energy expenditure required for the weight goal.;Weight Management/Obesity: Establish reasonable short term and long term weight goals.;Obesity: Provide education and appropriate resources to help participant work on and attain dietary goals.    Expected Outcomes Short Term: Continue to assess and modify interventions until short term weight is achieved;Long Term: Adherence to nutrition and physical activity/exercise program aimed toward attainment of established weight goal;Understanding recommendations for meals to include 15-35% energy as protein, 25-35% energy from fat, 35-60% energy from carbohydrates, less than 200mg  of dietary cholesterol, 20-35 gm of total fiber daily;Understanding of distribution of calorie intake throughout the day with the consumption of 4-5 meals/snacks    Hypertension Yes    Intervention Provide education on lifestyle modifcations including regular physical activity/exercise, weight management, moderate sodium restriction and increased consumption of fresh fruit, vegetables, and low fat dairy, alcohol moderation, and smoking cessation.;Monitor prescription use compliance.    Expected Outcomes Short Term: Continued assessment and intervention until BP is < 140/53mm HG in hypertensive participants. < 130/38mm HG in hypertensive participants with  diabetes, heart failure or chronic kidney disease.;Long Term: Maintenance of blood pressure at goal levels.    Lipids Yes    Intervention Provide education and support for participant on nutrition & aerobic/resistive exercise along with prescribed medications to achieve LDL 70mg , HDL >40mg .    Expected Outcomes Short Term: Participant states understanding of desired cholesterol values and is compliant with medications prescribed. Participant is following exercise prescription and nutrition guidelines.;Long Term: Cholesterol controlled with medications as prescribed, with individualized exercise RX and with personalized nutrition plan. Value goals: LDL < 70mg , HDL > 40 mg.             Core Components/Risk Factors/Patient Goals Review:   Goals and Risk Factor Review     Row Name 08/24/23 1552             Core Components/Risk Factors/Patient Goals Review  Personal Goals Review Weight Management/Obesity;Hypertension;Lipids       Review Isaac [name] is off to a good start to exercise. Vital signs have been stable.       Expected Outcomes Isaac [name] will continue to participate in cardiac rehab for exercise, nutriiton and lifestyle modifications                Core Components/Risk Factors/Patient Goals at Discharge (Final Review):   Goals and Risk Factor Review - 08/24/23 1552       Core Components/Risk Factors/Patient Goals Review   Personal Goals Review Weight Management/Obesity;Hypertension;Lipids    Review Isaac [name] is off to a good start to exercise. Vital signs have been stable.    Expected Outcomes Isaac [name] will continue to participate in cardiac rehab for exercise, nutriiton and lifestyle modifications             ITP Comments:  ITP Comments     Row Name 08/15/23 1423 08/24/23 1533 08/28/23 1023       ITP Comments Dr. Armanda Magic medical director. Introduction to pritikin education/intensive cardiac rehab. Initial orientation packet reviewed with patient. 30 Day ITP Review. Isaac [name] started  cardiac rehab on 08/20/23. Isaac [name] did well with exercise. 30 Day ITP Review. Isaac [name] started cardiac rehab on 08/20/23. Isaac [name] is off to a good start  with exercise.              Comments: See ITP comments.Thayer Headings RN BSN

## 2023-08-31 ENCOUNTER — Telehealth (HOSPITAL_COMMUNITY): Payer: Self-pay

## 2023-08-31 ENCOUNTER — Encounter (HOSPITAL_COMMUNITY): Admission: RE | Admit: 2023-08-31 | Source: Ambulatory Visit

## 2023-08-31 NOTE — Telephone Encounter (Signed)
 Patient called out due to having to drive brother to doctor's appt.

## 2023-09-03 ENCOUNTER — Encounter (HOSPITAL_COMMUNITY)
Admission: RE | Admit: 2023-09-03 | Discharge: 2023-09-03 | Disposition: A | Discharge status: Home / Self Care | Source: Ambulatory Visit | Attending: Internal Medicine | Admitting: Internal Medicine

## 2023-09-03 DIAGNOSIS — Z951 Presence of aortocoronary bypass graft: Secondary | ICD-10-CM | POA: Diagnosis not present

## 2023-09-05 ENCOUNTER — Encounter (HOSPITAL_COMMUNITY)
Admission: RE | Admit: 2023-09-05 | Discharge: 2023-09-05 | Disposition: A | Discharge status: Home / Self Care | Source: Ambulatory Visit | Attending: Internal Medicine | Admitting: Internal Medicine

## 2023-09-05 DIAGNOSIS — Z951 Presence of aortocoronary bypass graft: Secondary | ICD-10-CM | POA: Diagnosis not present

## 2023-09-07 ENCOUNTER — Encounter (HOSPITAL_COMMUNITY)
Admission: RE | Admit: 2023-09-07 | Discharge: 2023-09-07 | Disposition: A | Discharge status: Home / Self Care | Source: Ambulatory Visit | Attending: Internal Medicine | Admitting: Internal Medicine

## 2023-09-07 DIAGNOSIS — Z951 Presence of aortocoronary bypass graft: Secondary | ICD-10-CM

## 2023-09-10 ENCOUNTER — Encounter (HOSPITAL_COMMUNITY)
Admission: RE | Admit: 2023-09-10 | Discharge: 2023-09-10 | Disposition: A | Discharge status: Home / Self Care | Source: Ambulatory Visit | Attending: Internal Medicine

## 2023-09-10 DIAGNOSIS — Z951 Presence of aortocoronary bypass graft: Secondary | ICD-10-CM

## 2023-09-12 ENCOUNTER — Encounter (HOSPITAL_COMMUNITY)
Admission: RE | Admit: 2023-09-12 | Discharge: 2023-09-12 | Disposition: A | Discharge status: Home / Self Care | Source: Ambulatory Visit | Attending: Internal Medicine | Admitting: Internal Medicine

## 2023-09-12 DIAGNOSIS — Z951 Presence of aortocoronary bypass graft: Secondary | ICD-10-CM

## 2023-09-14 ENCOUNTER — Encounter (HOSPITAL_COMMUNITY)
Admission: RE | Admit: 2023-09-14 | Discharge: 2023-09-14 | Disposition: A | Discharge status: Home / Self Care | Source: Ambulatory Visit | Attending: Internal Medicine | Admitting: Internal Medicine

## 2023-09-14 ENCOUNTER — Ambulatory Visit
Admission: RE | Admit: 2023-09-14 | Discharge: 2023-09-14 | Disposition: A | Discharge status: Home / Self Care | Payer: Self-pay | Source: Ambulatory Visit | Attending: Family Medicine | Admitting: Family Medicine

## 2023-09-14 VITALS — BP 131/81 | HR 62 | Temp 97.5°F | Resp 20

## 2023-09-14 DIAGNOSIS — Z951 Presence of aortocoronary bypass graft: Secondary | ICD-10-CM | POA: Diagnosis not present

## 2023-09-14 DIAGNOSIS — L249 Irritant contact dermatitis, unspecified cause: Secondary | ICD-10-CM

## 2023-09-14 MED ORDER — TRIAMCINOLONE ACETONIDE 0.1 % EX CREA: 1.0000 | TOPICAL_CREAM | Freq: Two times a day (BID) | CUTANEOUS | 0 refills | Status: DC

## 2023-09-14 NOTE — ED Provider Notes (Signed)
 [facility]  Note:  This document was prepared using Conservation officer, historic buildings and may include unintentional dictation errors.  MRN: [id number] DOB: 1946-04-22  Subjective:   Isaac [name] is a 78 y.o. male presenting for 2-week history of persistent irritating spot over the right lateral torso.  Patient reports that symptoms started after his small dog was jumping on his back and scratching.  Initially he felt more pain and had more redness but this has calmed down significantly.  However, it is not resolving fully.  Still feels general irritation, itching, intermittent burning and stinging sensations.  Denies fever, drainage of pus or bleeding.  No vesicular lesions.  Reports that he has had his shingles shot.  No current facility-administered medications for this encounter.  Current Outpatient Medications:    aspirin EC 325 MG tablet, Take 1 tablet (325 mg total) by mouth daily., Disp: , Rfl:    atorvastatin (LIPITOR) 40 MG tablet, Take 1 tablet (40 mg total) by mouth daily., Disp: 90 tablet, Rfl: 3   Ibuprofen-diphenhydrAMINE HCl (ADVIL PM) 200-25 MG CAPS, Take 2 tablets by mouth at bedtime., Disp: , Rfl:    MAGNESIUM PO, Take 1 tablet by mouth at bedtime., Disp: , Rfl:    metoprolol tartrate (LOPRESSOR) 50 MG tablet, Take 1 tablet (50 mg total) by mouth 2 (two) times daily., Disp: 180 tablet, Rfl: 3   Multiple Vitamin (MULTI VITAMIN) TABS, Take 1 tablet by mouth at bedtime., Disp: , Rfl:    traMADol (ULTRAM) 50 MG tablet, Take 1 tablet (50 mg total) by mouth every 6 (six) hours as needed for severe pain (pain score 7-10). (Patient not taking: Reported on 08/15/2023), Disp: 24 tablet, Rfl: 0   TURMERIC PO, Take 1 tablet by mouth at bedtime., Disp: , Rfl:    No Known Allergies  Past Medical History:  Diagnosis Date   Abnormal fasting glucose    Acute right-sided low back pain with right-sided sciatica    Anginal pain (HCC)    Arthritis    R knee-  DJD   BMI 35.0-35.9,adult    Complication of anesthesia    tachycardia with ST depression, HTN witth left TKA 04/2010 (neg cardiac w/u)   Coronary artery disease    Difficult intubation    glidescope used 04/06/10   DJD (degenerative joint disease) of knee    Family history of bipolar disorder    Heart murmur    History of kidney stones    HLD (hyperlipidemia)    Overweight    Piriformis syndrome of right side      Past Surgical History:  Procedure Laterality Date   CORONARY ARTERY BYPASS GRAFT N/A 06/14/2023   Procedure: OFF PUMP CORONARY ARTERY BYPASS GRAFTING X 1, USING LEFT INTERNAL MAMMARY ARTERY;  Surgeon: Corliss Skains, MD;  Location: [facility] OR;  Service: Open Heart Surgery;  Laterality: N/A;   JOINT REPLACEMENT  2010   L TKA   LEFT HEART CATH AND CORONARY ANGIOGRAPHY N/A 06/01/2023   Procedure: LEFT HEART CATH AND CORONARY ANGIOGRAPHY;  Surgeon: [place], Peter M, MD;  Location: [facility] INVASIVE CV LAB;  Service: Cardiovascular;  Laterality: N/A;   TEE WITHOUT CARDIOVERSION N/A 06/14/2023   Procedure: TRANSESOPHAGEAL ECHOCARDIOGRAM (TEE);  Surgeon: Corliss Skains, MD;  Location: [facility] OR;  Service: Open Heart Surgery;  Laterality: N/A;   TOTAL KNEE ARTHROPLASTY Right 03/19/2013   Procedure: TOTAL KNEE ARTHROPLASTY;  Surgeon: Loreta Ave, MD;  Location: [facility] OR;  Service: Orthopedics;  Laterality: Right;   TOTAL KNEE ARTHROPLASTY Bilateral     Family History  Problem Relation Age of Onset   Other Mother        toxic septic shock colon   Cancer Father        breast   Bipolar disorder Father    Suicidality Brother    COPD Brother     Social History   Tobacco Use   Smoking status: Former    Current packs/day: 0.00    Types: Cigarettes    Quit date: 03/11/1995    Years since quitting: 28.5   Smokeless tobacco: Never  Vaping Use   Vaping status: Never Used  Substance Use Topics   Alcohol use: Yes    Comment: maybe 1 beer per week   Drug use: No     ROS   Objective:   Vitals: BP 131/81 (BP Location: Right Arm)   Pulse 62   Temp (!) 97.5 F (36.4 C) (Oral)   Resp 20   SpO2 96%   Physical Exam Constitutional:      General: He is not in acute distress.    Appearance: Normal appearance. He is well-developed and normal weight. He is not ill-appearing, toxic-appearing or diaphoretic.  HENT:     Head: Normocephalic and atraumatic.     Right Ear: External ear normal.     Left Ear: External ear normal.     Nose: Nose normal.     Mouth/Throat:     Pharynx: Oropharynx is clear.  Eyes:     General: No scleral icterus.       Right eye: No discharge.        Left eye: No discharge.     Extraocular Movements: Extraocular movements intact.  Cardiovascular:     Rate and Rhythm: Normal rate.  Pulmonary:     Effort: Pulmonary effort is normal.  Musculoskeletal:     Cervical back: Normal range of motion.  Skin:      Neurological:     Mental Status: He is alert and oriented to person, place, and time.  Psychiatric:        Mood and Affect: Mood normal.        Behavior: Behavior normal.        Thought Content: Thought content normal.        Judgment: Judgment normal.     Assessment and Plan :   PDMP not reviewed this encounter.  1. Irritant dermatitis    Will manage for irritant dermatitis with topical triamcinolone.  Monitor for signs of shingles and cellulitis, abscess. Counseled patient on potential for adverse effects with medications prescribed/recommended today, ER and return-to-clinic precautions discussed, patient verbalized understanding.    Wallis Bamberg, PA-C 09/14/23 1212

## 2023-09-14 NOTE — ED Triage Notes (Signed)
 Pt c/o ?bug bite vs dog scratch to right side of back x 2 week-burning, stinging, itching sensation to area-feels may have started when his dog scratched him but a family member told him it looks like a bug bite-NAD-steady gait

## 2023-09-17 ENCOUNTER — Encounter (HOSPITAL_COMMUNITY)
Admission: RE | Admit: 2023-09-17 | Discharge: 2023-09-17 | Disposition: A | Discharge status: Home / Self Care | Source: Ambulatory Visit | Attending: Internal Medicine | Admitting: Internal Medicine

## 2023-09-17 DIAGNOSIS — Z951 Presence of aortocoronary bypass graft: Secondary | ICD-10-CM

## 2023-09-19 ENCOUNTER — Encounter (HOSPITAL_COMMUNITY)
Admission: RE | Admit: 2023-09-19 | Discharge: 2023-09-19 | Disposition: A | Discharge status: Home / Self Care | Source: Ambulatory Visit | Attending: Internal Medicine

## 2023-09-19 DIAGNOSIS — Z951 Presence of aortocoronary bypass graft: Secondary | ICD-10-CM

## 2023-09-19 NOTE — Progress Notes (Signed)
 CARDIAC REHAB PHASE 2  Reviewed home exercise with pt today. Pt is tolerating exercise well. Pt will continue to exercise on his own by going to the [facility], weights and core for 30-45 minutes per session 1-2 days a week in addition to the 3 days in CRP2. Advised pt on THRR, RPE scale, hydration and temperature/humidity precautions. Reinforced NTG use, S/S to stop exercise and when to call MD vs 911. Encouraged warm up cool down and stretches with exercise sessions. Pt verbalized understanding, all questions were answered and pt was given a copy to take home.    Isaac [name] S Isaac [name] ACSM-CEP 09/19/2023 4:27 PM

## 2023-09-21 ENCOUNTER — Encounter (HOSPITAL_COMMUNITY)
Admission: RE | Admit: 2023-09-21 | Discharge: 2023-09-21 | Disposition: A | Discharge status: Home / Self Care | Source: Ambulatory Visit | Attending: Internal Medicine | Admitting: Internal Medicine

## 2023-09-21 DIAGNOSIS — Z951 Presence of aortocoronary bypass graft: Secondary | ICD-10-CM

## 2023-09-24 ENCOUNTER — Encounter (HOSPITAL_COMMUNITY)
Admission: RE | Admit: 2023-09-24 | Discharge: 2023-09-24 | Disposition: A | Discharge status: Home / Self Care | Source: Ambulatory Visit | Attending: Internal Medicine

## 2023-09-24 DIAGNOSIS — Z951 Presence of aortocoronary bypass graft: Secondary | ICD-10-CM | POA: Diagnosis not present

## 2023-09-26 ENCOUNTER — Encounter (HOSPITAL_COMMUNITY)
Admission: RE | Admit: 2023-09-26 | Discharge: 2023-09-26 | Disposition: A | Discharge status: Home / Self Care | Source: Ambulatory Visit | Attending: Internal Medicine | Admitting: Internal Medicine

## 2023-09-26 DIAGNOSIS — Z951 Presence of aortocoronary bypass graft: Secondary | ICD-10-CM | POA: Diagnosis not present

## 2023-09-26 NOTE — Progress Notes (Signed)
 Cardiac Individual Treatment Plan  Patient Details  Name: Isaac [name] MRN: [id number] Date of Birth: 03/28/1946 Referring Provider:   Flowsheet Row INTENSIVE CARDIAC REHAB ORIENT from 08/15/2023 in [facility], [facility], & Lung Health  Referring Provider Sarah Cumber, MD       Initial Encounter Date:  Flowsheet Row INTENSIVE CARDIAC REHAB ORIENT from 08/15/2023 in [facility] for Heart, Vascular, & Lung Health  Date 08/15/23       Visit Diagnosis: 06/14/23 CABG x 1  Patient's Home Medications on Admission:  Current Outpatient Medications:    aspirin  EC 325 MG tablet, Take 1 tablet (325 mg total) by mouth daily., Disp: , Rfl:    atorvastatin  (LIPITOR) 40 MG tablet, Take 1 tablet (40 mg total) by mouth daily., Disp: 90 tablet, Rfl: 3   Ibuprofen-diphenhydrAMINE  HCl (ADVIL PM) 200-25 MG CAPS, Take 2 tablets by mouth at bedtime., Disp: , Rfl:    MAGNESIUM  PO, Take 1 tablet by mouth at bedtime., Disp: , Rfl:    metoprolol  tartrate (LOPRESSOR ) 50 MG tablet, Take 1 tablet (50 mg total) by mouth 2 (two) times daily., Disp: 180 tablet, Rfl: 3   Multiple Vitamin (MULTI VITAMIN) TABS, Take 1 tablet by mouth at bedtime., Disp: , Rfl:    traMADol  (ULTRAM ) 50 MG tablet, Take 1 tablet (50 mg total) by mouth every 6 (six) hours as needed for severe pain (pain score 7-10). (Patient not taking: Reported on 08/15/2023), Disp: 24 tablet, Rfl: 0   triamcinolone  cream (KENALOG ) 0.1 %, Apply 1 Application topically 2 (two) times daily., Disp: 30 g, Rfl: 0   TURMERIC PO, Take 1 tablet by mouth at bedtime., Disp: , Rfl:   Past Medical History: Past Medical History:  Diagnosis Date   Abnormal fasting glucose    Acute right-sided low back pain with right-sided sciatica    Anginal pain (HCC)    Arthritis    R knee- DJD   BMI 35.0-35.9,adult    Complication of anesthesia    tachycardia with ST depression, HTN witth left TKA 04/2010 (neg  cardiac w/u)   Coronary artery disease    Difficult intubation    glidescope used 04/06/10   DJD (degenerative joint disease) of knee    Family history of bipolar disorder    Heart murmur    History of kidney stones    HLD (hyperlipidemia)    Overweight    Piriformis syndrome of right side     Tobacco Use: Social History   Tobacco Use  Smoking Status Former   Current packs/day: 0.00   Types: Cigarettes   Quit date: 03/11/1995   Years since quitting: 28.5  Smokeless Tobacco Never    Labs: Review Flowsheet  More data may exist      Latest Ref Rng & Units 04/07/2010 05/22/2023 06/13/2023 06/14/2023 08/01/2023  Labs for ITP Cardiac and Pulmonary Rehab  Cholestrol 100 - 199 mg/dL 409 (NOTE) ATP III Classification:      < 200        mg/dL        Desirable     811 - 239     mg/dL        Borderline High     >= 240        mg/dL        High   - - - 914   LDL (calc) 0 - 99 mg/dL 81 (NOTE)  Total Cholesterol/HDL Ratio:CHD Risk  Coronary Heart Disease Risk Table                                       Men       Women         1/2 Average Risk              3.4        3.3             Average Risk              5.0         4.4         2 X Average Risk              9.6        7.1         3 X Average Risk             23.4       11.0 Use the calculated Patient Ratio above and the CHD Risk table  to determine the patient's CHD Risk. ATP III Classification (LDL):      < 100         mg/dL         Optimal     295 - 129     mg/dL         Near or Above Optimal     130 - 159     mg/dL         Borderline High     160 - 189     mg/dL         High      > 621        mg/dL         Very High   - - - 45   Direct LDL 0 - 99 mg/dL - 89  - - -  HDL-C >30 mg/dL 43  - - - 41   Trlycerides 0 - 149 mg/dL 80  - - - 865   Hemoglobin A1c 4.8 - 5.6 % - - 5.5  - -  PH, Arterial 7.35 - 7.45 - - - 7.321  7.375  7.385  7.303  7.293  7.308  -  PCO2 arterial 32 - 48 mmHg - - - 41.9  32.4  34.5  39.2  42.5  44.4  -   Bicarbonate 20.0 - 28.0 mmol/L - - - 21.7  19.1  21.4  19.5  20.6  22.2  -  TCO2 22 - 32 mmol/L - - - 23  20  23  21  22  22  22  21  24  23   -  Acid-base deficit 0.0 - 2.0 mmol/L - - - 4.0  6.0  4.0  6.0  6.0  4.0  -  O2 Saturation % - - - 95  95  97  99  99  100  -    Details       Multiple values from one day are sorted in reverse-chronological order         Capillary Blood Glucose: Lab Results  Component Value Date   GLUCAP 107 (H) 06/20/2023   GLUCAP 127 (H) 06/19/2023   GLUCAP 126 (H) 06/19/2023   GLUCAP 106 (H) 06/19/2023   GLUCAP 105 (H) 06/19/2023     Exercise Target Goals: Exercise Program  Goal: Individual exercise prescription set using results from initial 6 min walk test and THRR while considering  patient's activity barriers and safety.   Exercise Prescription Goal: Initial exercise prescription builds to 30-45 minutes a day of aerobic activity, 2-3 days per week.  Home exercise guidelines will be given to patient during program as part of exercise prescription that the participant will acknowledge.  Activity Barriers & Risk Stratification:  Activity Barriers & Cardiac Risk Stratification - 08/15/23 1426       Activity Barriers & Cardiac Risk Stratification   Activity Barriers Arthritis;Left Knee Replacement;Right Knee Replacement;Balance Concerns;Incisional Pain;Joint Problems;Other (comment)    Comments sternal precautions    Cardiac Risk Stratification High   <5 METs on            6 Minute Walk:  6 Minute Walk     Row Name 08/15/23 1539         6 Minute Walk   Phase Initial     Distance 1560 feet     Walk Time 6 minutes     # of Rest Breaks 0     MPH 2.95     METS 2.5     RPE 12     Perceived Dyspnea  0     VO2 Peak 8.77     Symptoms No     Resting HR 72 bpm     Resting BP 114/62     Resting Oxygen Saturation  97 %     Exercise Oxygen Saturation  during 6 min walk 97 %     Max Ex. HR 90 bpm     Max Ex. BP 138/70     2  Minute Post BP 118/68              Oxygen Initial Assessment:   Oxygen Re-Evaluation:   Oxygen Discharge (Final Oxygen Re-Evaluation):   Initial Exercise Prescription:  Initial Exercise Prescription - 08/15/23 1500       Date of Initial Exercise RX and Referring Provider   Date 08/15/23    Referring Provider Sarah Cumber, MD    Expected Discharge Date 11/07/23      Recumbant Bike   Level 1    RPM 50    Watts 30    Minutes 15    METs 2      NuStep   Level 1    SPM 70    Minutes 15    METs 2      Prescription Details   Frequency (times per week) 3    Duration Progress to 30 minutes of continuous aerobic without signs/symptoms of physical distress      Intensity   THRR 40-80% of Max Heartrate 57-117    Ratings of Perceived Exertion 11-13    Perceived Dyspnea 0-4      Progression   Progression Continue progressive overload as per policy without signs/symptoms or physical distress.      Resistance Training   Training Prescription Yes    Weight 3    Reps 10-15             Perform Capillary Blood Glucose checks as needed.  Exercise Prescription Changes:   Exercise Prescription Changes     Row Name 08/20/23 1633 09/05/23 1634 09/19/23 1618         Response to Exercise   Blood Pressure (Admit) 100/60 112/62 108/60     Blood Pressure (Exercise) 120/68 124/62 --     Blood Pressure (Exit) 100/60  122/64 98/60     Heart Rate (Admit) 56 bpm 64 bpm 77 bpm     Heart Rate (Exercise) 92 bpm 88 bpm 113 bpm     Heart Rate (Exit) 65 bpm 64 bpm 88 bpm     Rating of Perceived Exertion (Exercise) 8.5 13 13      Symptoms 0 0 0     Comments Pt first day in teh [facility] program. reviewed MET's and goals reviewed MET's and home ExRx     Duration Progress to 30 minutes of  aerobic without signs/symptoms of physical distress Progress to 30 minutes of  aerobic without signs/symptoms of physical distress Progress to 30 minutes of  aerobic without  signs/symptoms of physical distress     Intensity THRR unchanged THRR unchanged THRR unchanged       Progression   Progression Continue to progress workloads to maintain intensity without signs/symptoms of physical distress. Continue to progress workloads to maintain intensity without signs/symptoms of physical distress. Continue to progress workloads to maintain intensity without signs/symptoms of physical distress.     Average METs 2.35 3.1 3.9       Resistance Training   Training Prescription Yes Yes No     Weight 3 3 3      Reps 10-15 10-15 10-15     Time 10 Minutes 10 Minutes 10 Minutes       Recumbant Bike   Level 2 4 --     RPM 64 52 --     Watts 20 55 --     Minutes 15 15 --     METs 2.2 3.5 --       NuStep   Level 1 4 4      SPM 89 89 91     Minutes 15 15 15      METs 2.5 --  2.7 last visit 85 spm 2.7       Elliptical   Level -- -- 1     Speed -- -- 1     Minutes -- -- 15     METs -- -- 5.1       Home Exercise Plan   Plans to continue exercise at -- -- [employer] (comment)     Frequency -- -- Add 2 additional days to program exercise sessions.     Initial Home Exercises Provided -- -- 09/19/23              Exercise Comments:   Exercise Comments     Row Name 08/20/23 1638 09/05/23 1639 09/19/23 1623       Exercise Comments Pt first day in the [facility] program. Pt toleraetd exercise well with an average MET level of 2.35. Pt is leanring his THRR, RPE and ExRx. He's off to a great start Reviewed MET's and goals. Pt toleraetd exercise well with an average MET level of 3.1. Pt is feeling good about his goals and is increasing strength and stamina. Reviewed MET's and home ExRx. Pt toleraetd exercise well with an average MET level of 3.9. Pt is doing very well and is changing equipment. He's dooing great on the elliptical. He plans to exercise on his own by going to [facility], weight and core strength 1-2 days for 30-45  mins a session. Patient is now off  sternal precautions. So talked about gradually adding in strength training.              Exercise Goals and Review:   Exercise Goals  Row Name 08/15/23 1426             Exercise Goals   Increase Physical Activity Yes       Intervention Provide advice, education, support and counseling about physical activity/exercise needs.;Develop an individualized exercise prescription for aerobic and resistive training based on initial evaluation findings, risk stratification, comorbidities and participant's personal goals.       Expected Outcomes Short Term: Attend rehab on a regular basis to increase amount of physical activity.;Long Term: Exercising regularly at least 3-5 days a week.;Long Term: Add in home exercise to make exercise part of routine and to increase amount of physical activity.       Increase Strength and Stamina Yes       Intervention Provide advice, education, support and counseling about physical activity/exercise needs.;Develop an individualized exercise prescription for aerobic and resistive training based on initial evaluation findings, risk stratification, comorbidities and participant's personal goals.       Expected Outcomes Short Term: Increase workloads from initial exercise prescription for resistance, speed, and METs.;Short Term: Perform resistance training exercises routinely during rehab and add in resistance training at home;Long Term: Improve cardiorespiratory fitness, muscular endurance and strength as measured by increased METs and functional capacity ( )       Able to understand and use rate of perceived exertion (RPE) scale Yes       Intervention Provide education and explanation on how to use RPE scale       Expected Outcomes Short Term: Able to use RPE daily in rehab to express subjective intensity level;Long Term:  Able to use RPE to guide intensity level when exercising independently       Knowledge and understanding of Target Heart Rate Range (THRR) Yes        Intervention Provide education and explanation of THRR including how the numbers were predicted and where they are located for reference       Expected Outcomes Short Term: Able to state/look up THRR;Short Term: Able to use daily as guideline for intensity in rehab;Long Term: Able to use THRR to govern intensity when exercising independently       Understanding of Exercise Prescription Yes       Intervention Provide education, explanation, and written materials on patient's individual exercise prescription       Expected Outcomes Short Term: Able to explain program exercise prescription;Long Term: Able to explain home exercise prescription to exercise independently                Exercise Goals Re-Evaluation :  Exercise Goals Re-Evaluation     Row Name 08/20/23 1637 09/05/23 1637           Exercise Goal Re-Evaluation   Exercise Goals Review Increase Physical Activity;Understanding of Exercise Prescription;Increase Strength and Stamina;Knowledge and understanding of Target Heart Rate Range (THRR);Able to understand and use rate of perceived exertion (RPE) scale Increase Physical Activity;Understanding of Exercise Prescription;Increase Strength and Stamina;Knowledge and understanding of Target Heart Rate Range (THRR);Able to understand and use rate of perceived exertion (RPE) scale      Comments Pt first day in the [facility] program. Pt toleraetd exercise well with an average MET level of 2.35. Pt is leanring his THRR, RPE and ExRx. He's off to a great start Reviewed MET's and goals. Pt toleraetd exercise well with an average MET level of 3.1. Pt is feeling good about his goals and is increasing strength and stamina.      Expected Outcomes Will continue to  monitor pt and progress workloads as tolerated without sign or symptom Will continue to monitor pt and progress workloads as tolerated without sign or symptom               Discharge Exercise Prescription (Final Exercise  Prescription Changes):  Exercise Prescription Changes - 09/19/23 1618       Response to Exercise   Blood Pressure (Admit) 108/60    Blood Pressure (Exit) 98/60    Heart Rate (Admit) 77 bpm    Heart Rate (Exercise) 113 bpm    Heart Rate (Exit) 88 bpm    Rating of Perceived Exertion (Exercise) 13    Symptoms 0    Comments reviewed MET's and home ExRx    Duration Progress to 30 minutes of  aerobic without signs/symptoms of physical distress    Intensity THRR unchanged      Progression   Progression Continue to progress workloads to maintain intensity without signs/symptoms of physical distress.    Average METs 3.9      Resistance Training   Training Prescription No    Weight 3    Reps 10-15    Time 10 Minutes      NuStep   Level 4    SPM 91    Minutes 15    METs 2.7      Elliptical   Level 1    Speed 1    Minutes 15    METs 5.1      Home Exercise Plan   Plans to continue exercise at [facility] (comment)    Frequency Add 2 additional days to program exercise sessions.    Initial Home Exercises Provided 09/19/23             Nutrition:  Target Goals: Understanding of nutrition guidelines, daily intake of sodium 1500mg , cholesterol 200mg , calories 30% from fat and 7% or less from saturated fats, daily to have 5 or more servings of fruits and vegetables.  Biometrics:  Pre Biometrics - 08/15/23 1424       Pre Biometrics   Waist Circumference 44 inches    Hip Circumference 45 inches    Waist to Hip Ratio 0.98 %    Triceps Skinfold 18 mm    % Body Fat 32.8 %    Grip Strength 32 kg    Flexibility 0 in   could not reach   Single Leg Stand 4.56 seconds              Nutrition Therapy Plan and Nutrition Goals:  Nutrition Therapy & Goals - 09/21/23 1433       Nutrition Therapy   Diet Heart healthy diet    Drug/Food Interactions Statins/Certain Fruits      Personal Nutrition Goals   Nutrition Goal Patient to identify strategies for reducing  cardiovascular risk by attending the [facility] education and nutrition series weekly.   goal in action.   Personal Goal #2 Patient to improve diet quality by using the plate method as a guide for meal planning to include lean protein/plant protein, fruits, vegetables, whole grains, nonfat dairy as part of a well-balanced diet.   goal in action.   Comments Goals in action. Indalecio has medical history of CABGx1, HTN, hyperlipidemia. Has started making some changes including reduced sodium, reduced processed meats, and cooking more at home. His LDL is at goal <55. He has maintained his weight since starting with our program. Patient will continue to benefit from participation in intensive cardiac rehab for nutrition,  exercise, and lifestyle modification.      Intervention Plan   Intervention Prescribe, educate and counsel regarding individualized specific dietary modifications aiming towards targeted core components such as weight, hypertension, lipid management, diabetes, heart failure and other comorbidities.;Nutrition handout(s) given to patient.    Expected Outcomes Short Term Goal: Understand basic principles of dietary content, such as calories, fat, sodium, cholesterol and nutrients.;Long Term Goal: Adherence to prescribed nutrition plan.             Nutrition Assessments:  Nutrition Assessments - 08/21/23 1527       Rate Your Plate Scores   Pre Score 49            MEDIFICTS Score Key: >=70 Need to make dietary changes  40-70 Heart Healthy Diet <= 40 Therapeutic Level Cholesterol Diet   Flowsheet Row INTENSIVE CARDIAC REHAB from 08/20/2023 in [facility] for Heart, Vascular, & Lung Health  Picture Your Plate Total Score on Admission 49      Picture Your Plate Scores: <81 Unhealthy dietary pattern with much room for improvement. 41-50 Dietary pattern unlikely to meet recommendations for good health and room for improvement. 51-60 More healthful dietary  pattern, with some room for improvement.  >60 Healthy dietary pattern, although there may be some specific behaviors that could be improved.    Nutrition Goals Re-Evaluation:  Nutrition Goals Re-Evaluation     Row Name 08/21/23 1406 09/21/23 1433           Goals   Current Weight 222 lb 10.6 oz (101 kg) 222 lb 10.6 oz (101 kg)      Comment lipids WNL, LDL <55 (45), Lpa WNL, A1c WNL no new labs; most recent labs lipids WNL, LDL <55 (45), Lpa WNL, A1c WNL      Expected Outcome Foch has medical history of CABGx1, HTN, hyperlipidemia. Has started making some changes including reduced sodium and reduced processed meats. His LDL is at goal <55. Patient will benefit from participation in intensive cardiac rehab for nutrition, exercise, and lifestyle modification. Goals in action. Bran has medical history of CABGx1, HTN, hyperlipidemia. Has started making some changes including reduced sodium, reduced processed meats, and cooking more at home. His LDL is at goal <55. He has maintained his weight since starting with our program. Patient will continue to benefit from participation in intensive cardiac rehab for nutrition, exercise, and lifestyle modification.               Nutrition Goals Re-Evaluation:  Nutrition Goals Re-Evaluation     Row Name 08/21/23 1406 09/21/23 1433           Goals   Current Weight 222 lb 10.6 oz (101 kg) 222 lb 10.6 oz (101 kg)      Comment lipids WNL, LDL <55 (45), Lpa WNL, A1c WNL no new labs; most recent labs lipids WNL, LDL <55 (45), Lpa WNL, A1c WNL      Expected Outcome Gwendolyn has medical history of CABGx1, HTN, hyperlipidemia. Has started making some changes including reduced sodium and reduced processed meats. His LDL is at goal <55. Patient will benefit from participation in intensive cardiac rehab for nutrition, exercise, and lifestyle modification. Goals in action. Keaden has medical history of CABGx1, HTN, hyperlipidemia. Has started making some  changes including reduced sodium, reduced processed meats, and cooking more at home. His LDL is at goal <55. He has maintained his weight since starting with our program. Patient will continue to benefit from participation  in intensive cardiac rehab for nutrition, exercise, and lifestyle modification.               Nutrition Goals Discharge (Final Nutrition Goals Re-Evaluation):  Nutrition Goals Re-Evaluation - 09/21/23 1433       Goals   Current Weight 222 lb 10.6 oz (101 kg)    Comment no new labs; most recent labs lipids WNL, LDL <55 (45), Lpa WNL, A1c WNL    Expected Outcome Goals in action. Jasiri has medical history of CABGx1, HTN, hyperlipidemia. Has started making some changes including reduced sodium, reduced processed meats, and cooking more at home. His LDL is at goal <55. He has maintained his weight since starting with our program. Patient will continue to benefit from participation in intensive cardiac rehab for nutrition, exercise, and lifestyle modification.             Psychosocial: Target Goals: Acknowledge presence or absence of significant depression and/or stress, maximize coping skills, provide positive support system. Participant is able to verbalize types and ability to use techniques and skills needed for reducing stress and depression.  Initial Review & Psychosocial Screening:  Initial Psych Review & Screening - 08/15/23 1426       Initial Review   Current issues with None Identified      Family Dynamics   Good Support System? Yes   Daughter for support   Comments Isaac [name] denies any feelings of anxiety/stress/depression. He said he feels worried occasionally due to having other blockages that they couldn't fix with previous CABG but he plans to discuss this with his cardiologist in April. 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      Barriers   Psychosocial barriers to participate in program There are no identifiable barriers or psychosocial  needs.      Screening Interventions   Interventions Encouraged to exercise;Provide feedback about the scores to participant    Expected Outcomes Short Term goal: Identification and review with participant of any Quality of Life or Depression concerns found by scoring the questionnaire.;Long Term goal: The participant improves quality of Life and PHQ9 Scores as seen by post scores and/or verbalization of changes             Quality of Life Scores:  Quality of Life - 08/15/23 1444       Quality of Life   Select Quality of Life      Quality of Life Scores   Health/Function Pre 29.2 %    Socioeconomic Pre 30 %    Psych/Spiritual Pre 30 %    Family Pre 28.13 %    GLOBAL Pre 29.41 %            Scores of 19 and below usually indicate a poorer quality of life in these areas.  A difference of  2-3 points is a clinically meaningful difference.  A difference of 2-3 points in the total score of the Quality of Life Index has been associated with significant improvement in overall quality of life, self-image, physical symptoms, and general health in studies assessing change in quality of life.  PHQ-9: Review Flowsheet       08/15/2023  Depression screen PHQ 2/9  Decreased Interest 0  Down, Depressed, Hopeless 0  PHQ - 2 Score 0  Altered sleeping 0  Tired, decreased energy 0  Change in appetite 0  Feeling bad or failure about yourself  0  Trouble concentrating 0  Moving slowly or fidgety/restless 0  Suicidal thoughts 0  PHQ-9 Score 0   Interpretation of Total Score  Total Score Depression Severity:  1-4 = Minimal depression, 5-9 = Mild depression, 10-14 = Moderate depression, 15-19 = Moderately severe depression, 20-27 = Severe depression   Psychosocial Evaluation and Intervention:   Psychosocial Re-Evaluation:  Psychosocial Re-Evaluation     Row Name 08/24/23 1549 08/24/23 1550 09/26/23 0828         Psychosocial Re-Evaluation   Current issues with None Identified  None Identified None Identified     Interventions Encouraged to attend Cardiac Rehabilitation for the exercise Encouraged to attend Cardiac Rehabilitation for the exercise Encouraged to attend Cardiac Rehabilitation for the exercise     Continue Psychosocial Services  No Follow up required No Follow up required No Follow up required              Psychosocial Discharge (Final Psychosocial Re-Evaluation):  Psychosocial Re-Evaluation - 09/26/23 0828       Psychosocial Re-Evaluation   Current issues with None Identified    Interventions Encouraged to attend Cardiac Rehabilitation for the exercise    Continue Psychosocial Services  No Follow up required             Vocational Rehabilitation: Provide vocational rehab assistance to qualifying candidates.   Vocational Rehab Evaluation & Intervention:  Vocational Rehab - 08/15/23 1430       Initial Vocational Rehab Evaluation & Intervention   Assessment shows need for Vocational Rehabilitation No   Isaac [name] is retired            Education: Education Goals: Education classes will be provided on a weekly basis, covering required topics. Participant will state understanding/return demonstration of topics presented.    Education     Row Name 08/20/23 1600     Education   Cardiac Education Topics Pritikin   Glass blower/designer Nutrition   Nutrition Workshop Targeting Your Nutrition Priorities   Instruction Review Code 1- Verbalizes Understanding   Class Start Time 1400   Class Stop Time 1440   Class Time Calculation (min) 40 min    Row Name 08/22/23 1500     Education   Cardiac Education Topics Pritikin   Orthoptist   Educator Dietitian   Weekly Topic One-Pot Wonders   Instruction Review Code 1- Verbalizes Understanding   Class Start Time 1400   Class Stop Time 1445   Class Time Calculation (min) 45 min    Row Name 08/24/23 1300      Education   Cardiac Education Topics Pritikin   Hospital doctor Education   General Education Hypertension and Heart Disease   Instruction Review Code 1- Verbalizes Understanding   Class Start Time 1405   Class Stop Time 1455   Class Time Calculation (min) 50 min    Row Name 08/27/23 1600     Education   Cardiac Education Topics Pritikin   Select Workshops     Workshops   Educator Exercise Physiologist   Select Psychosocial   Psychosocial Workshop Focused Goals, Sustainable Changes   Instruction Review Code 1- Verbalizes Understanding   Class Start Time 1345   Class Stop Time 1443   Class Time Calculation (min) 58 min    Row Name 08/29/23 1600     Education   Cardiac Education Topics Pritikin   Select  Office manager   Weekly Topic Comforting Weekend Breakfasts   Instruction Review Code 1- Verbalizes Understanding   Class Start Time 1400   Class Stop Time 1440   Class Time Calculation (min) 40 min    Row Name 09/03/23 1400     Education   Cardiac Education Topics Pritikin   Psychologist, forensic Exercise Education   Exercise Education Biomechanial Limitations   Instruction Review Code 1- Verbalizes Understanding   Class Start Time 1400   Class Stop Time 1440   Class Time Calculation (min) 40 min    Row Name 09/05/23 1600     Education   Cardiac Education Topics Pritikin   Customer service manager   Weekly Topic Fast Evening Meals   Instruction Review Code 1- Verbalizes Understanding   Class Start Time 1400   Class Stop Time 1440   Class Time Calculation (min) 40 min    Row Name 09/07/23 1400     Education   Cardiac Education Topics Pritikin   Licensed conveyancer Nutrition   Nutrition Vitamins and  Minerals   Instruction Review Code 1- Verbalizes Understanding   Class Start Time 1400   Class Stop Time 1440   Class Time Calculation (min) 40 min    Row Name 09/10/23 1500     Education   Cardiac Education Topics Pritikin   Glass blower/designer Nutrition   Nutrition Workshop Fueling a Forensic psychologist   Instruction Review Code 1- Teaching laboratory technician Start Time 1400   Class Stop Time 1440   Class Time Calculation (min) 40 min    Row Name 09/12/23 1500     Education   Cardiac Education Topics Pritikin   Customer service manager   Weekly Topic International Cuisine- Spotlight on the [employer] Zones   Instruction Review Code 1- Verbalizes Understanding   Class Start Time 1400   Class Stop Time 1440   Class Time Calculation (min) 40 min    Row Name 09/14/23 1400     Education   Cardiac Education Topics Pritikin   Psychologist, forensic Exercise Education   Exercise Education Improving Performance   Instruction Review Code 1- Verbalizes Understanding   Class Start Time 1400   Class Stop Time 1435   Class Time Calculation (min) 35 min    Row Name 09/17/23 1600     Education   Cardiac Education Topics Pritikin   Select Workshops     Workshops   Educator Exercise Physiologist   Select Psychosocial   Psychosocial Workshop Healthy Sleep for a Healthy Heart   Instruction Review Code 1- Verbalizes Understanding   Class Start Time 1403   Class Stop Time 1457   Class Time Calculation (min) 54 min    Row Name 09/19/23 1400     Education   Cardiac Education Topics Pritikin   Customer service manager   Weekly Topic Simple Sides and Sauces   Instruction Review Code 1- [employer] Understanding  Class Start Time 1355   Class Stop Time 1431   Class Time Calculation (min) 36 min    Row  Name 09/21/23 1500     Education   Cardiac Education Topics Pritikin   Select Core Videos     Core Videos   Educator Exercise Physiologist   Select Psychosocial   Psychosocial How Our Thoughts Can Heal Our Hearts   Instruction Review Code 1- Verbalizes Understanding   Class Start Time 1400   Class Stop Time 1440   Class Time Calculation (min) 40 min    Row Name 09/24/23 1500     Education   Cardiac Education Topics Pritikin   Select Workshops     Workshops   Educator Exercise Physiologist   Select Exercise   Exercise Workshop Managing Heart Disease: Your Path to a Healthier Heart   Instruction Review Code 1- Verbalizes Understanding   Class Start Time 1400   Class Stop Time 1448   Class Time Calculation (min) 48 min            Core Videos: Exercise    Move It!  Clinical staff conducted group or individual video education with verbal and written material and guidebook.  Patient learns the recommended Pritikin exercise program. Exercise with the goal of living a long, healthy life. Some of the health benefits of exercise include controlled diabetes, healthier blood pressure levels, improved cholesterol levels, improved heart and lung capacity, improved sleep, and better body composition. Everyone should speak with their doctor before starting or changing an exercise routine.  Biomechanical Limitations Clinical staff conducted group or individual video education with verbal and written material and guidebook.  Patient learns how biomechanical limitations can impact exercise and how we can mitigate and possibly overcome limitations to have an impactful and balanced exercise routine.  Body Composition Clinical staff conducted group or individual video education with verbal and written material and guidebook.  Patient learns that body composition (ratio of muscle mass to fat mass) is a key component to assessing overall fitness, rather than body weight alone. Increased fat  mass, especially visceral belly fat, can put us  at increased risk for metabolic syndrome, type 2 diabetes, heart disease, and even death. It is recommended to combine diet and exercise (cardiovascular and resistance training) to improve your body composition. Seek guidance from your physician and exercise physiologist before implementing an exercise routine.  Exercise Action Plan Clinical staff conducted group or individual video education with verbal and written material and guidebook.  Patient learns the recommended strategies to achieve and enjoy long-term exercise adherence, including variety, self-motivation, self-efficacy, and positive decision making. Benefits of exercise include fitness, good health, weight management, more energy, better sleep, less stress, and overall well-being.  Medical   Heart Disease Risk Reduction Clinical staff conducted group or individual video education with verbal and written material and guidebook.  Patient learns our heart is our most vital organ as it circulates oxygen, nutrients, white blood cells, and hormones throughout the entire body, and carries waste away. Data supports a plant-based eating plan like the Pritikin Program for its effectiveness in slowing progression of and reversing heart disease. The video provides a number of recommendations to address heart disease.   Metabolic Syndrome and Belly Fat  Clinical staff conducted group or individual video education with verbal and written material and guidebook.  Patient learns what metabolic syndrome is, how it leads to heart disease, and how one can reverse it and keep it from coming back. You have  metabolic syndrome if you have 3 of the following 5 criteria: abdominal obesity, high blood pressure, high triglycerides, low HDL cholesterol, and high blood sugar.  Hypertension and Heart Disease Clinical staff conducted group or individual video education with verbal and written material and guidebook.   Patient learns that high blood pressure, or hypertension, is very common in the [place] . Hypertension is largely due to excessive salt intake, but other important risk factors include being overweight, physical inactivity, drinking too much alcohol , smoking, and not eating enough potassium from fruits and vegetables. High blood pressure is a leading risk factor for heart attack, stroke, congestive heart failure, dementia, kidney failure, and premature death. Long-term effects of excessive salt intake include stiffening of the arteries and thickening of heart muscle and organ damage. Recommendations include ways to reduce hypertension and the risk of heart disease.  Diseases of Our Time - Focusing on Diabetes Clinical staff conducted group or individual video education with verbal and written material and guidebook.  Patient learns why the best way to stop diseases of our time is prevention, through food and other lifestyle changes. Medicine (such as prescription pills and surgeries) is often only a Band-Aid on the problem, not a long-term solution. Most common diseases of our time include obesity, type 2 diabetes, hypertension, heart disease, and cancer. The Pritikin Program is recommended and has been proven to help reduce, reverse, and/or prevent the damaging effects of metabolic syndrome.  Nutrition   Overview of the Pritikin Eating Plan  Clinical staff conducted group or individual video education with verbal and written material and guidebook.  Patient learns about the Pritikin Eating Plan for disease risk reduction. The Pritikin Eating Plan emphasizes a wide variety of unrefined, minimally-processed carbohydrates, like fruits, vegetables, whole grains, and legumes. Go, Caution, and Stop food choices are explained. Plant-based and lean animal proteins are emphasized. Rationale provided for low sodium intake for blood pressure control, low added sugars for blood sugar stabilization, and low  added fats and oils for coronary artery disease risk reduction and weight management.  Calorie Density  Clinical staff conducted group or individual video education with verbal and written material and guidebook.  Patient learns about calorie density and how it impacts the Pritikin Eating Plan. Knowing the characteristics of the food you choose will help you decide whether those foods will lead to weight gain or weight loss, and whether you want to consume more or less of them. Weight loss is usually a side effect of the Pritikin Eating Plan because of its focus on low calorie-dense foods.  Label Reading  Clinical staff conducted group or individual video education with verbal and written material and guidebook.  Patient learns about the Pritikin recommended label reading guidelines and corresponding recommendations regarding calorie density, added sugars, sodium content, and whole grains.  Dining Out - Part 1  Clinical staff conducted group or individual video education with verbal and written material and guidebook.  Patient learns that restaurant meals can be sabotaging because they can be so high in calories, fat, sodium, and/or sugar. Patient learns recommended strategies on how to positively address this and avoid unhealthy pitfalls.  Facts on Fats  Clinical staff conducted group or individual video education with verbal and written material and guidebook.  Patient learns that lifestyle modifications can be just as effective, if not more so, as many medications for lowering your risk of heart disease. A Pritikin lifestyle can help to reduce your risk of inflammation and atherosclerosis (cholesterol build-up, or plaque,  in the artery walls). Lifestyle interventions such as dietary choices and physical activity address the cause of atherosclerosis. A review of the types of fats and their impact on blood cholesterol levels, along with dietary recommendations to reduce fat intake is also  included.  Nutrition Action Plan  Clinical staff conducted group or individual video education with verbal and written material and guidebook.  Patient learns how to incorporate Pritikin recommendations into their lifestyle. Recommendations include planning and keeping personal health goals in mind as an important part of their success.  Healthy Mind-Set    Healthy Minds, Bodies, Hearts  Clinical staff conducted group or individual video education with verbal and written material and guidebook.  Patient learns how to identify when they are stressed. Video will discuss the impact of that stress, as well as the many benefits of stress management. Patient will also be introduced to stress management techniques. The way we think, act, and feel has an impact on our hearts.  How Our Thoughts Can Heal Our Hearts  Clinical staff conducted group or individual video education with verbal and written material and guidebook.  Patient learns that negative thoughts can cause depression and anxiety. This can result in negative lifestyle behavior and serious health problems. Cognitive behavioral therapy is an effective method to help control our thoughts in order to change and improve our emotional outlook.  Additional Videos:  Exercise    Improving Performance  Clinical staff conducted group or individual video education with verbal and written material and guidebook.  Patient learns to use a non-linear approach by alternating intensity levels and lengths of time spent exercising to help burn more calories and lose more body fat. Cardiovascular exercise helps improve heart health, metabolism, hormonal balance, blood sugar control, and recovery from fatigue. Resistance training improves strength, endurance, balance, coordination, reaction time, metabolism, and muscle mass. Flexibility exercise improves circulation, posture, and balance. Seek guidance from your physician and exercise physiologist before  implementing an exercise routine and learn your capabilities and proper form for all exercise.  Introduction to Yoga  Clinical staff conducted group or individual video education with verbal and written material and guidebook.  Patient learns about yoga, a discipline of the coming together of mind, breath, and body. The benefits of yoga include improved flexibility, improved range of motion, better posture and core strength, increased lung function, weight loss, and positive self-image. Yoga's heart health benefits include lowered blood pressure, healthier heart rate, decreased cholesterol and triglyceride levels, improved immune function, and reduced stress. Seek guidance from your physician and exercise physiologist before implementing an exercise routine and learn your capabilities and proper form for all exercise.  Medical   Aging: Enhancing Your Quality of Life  Clinical staff conducted group or individual video education with verbal and written material and guidebook.  Patient learns key strategies and recommendations to stay in good physical health and enhance quality of life, such as prevention strategies, having an advocate, securing a Health Care Proxy and Power of Attorney, and keeping a list of medications and system for tracking them. It also discusses how to avoid risk for bone loss.  Biology of Weight Control  Clinical staff conducted group or individual video education with verbal and written material and guidebook.  Patient learns that weight gain occurs because we consume more calories than we burn (eating more, moving less). Even if your body weight is normal, you may have higher ratios of fat compared to muscle mass. Too much body fat puts you at increased  risk for cardiovascular disease, heart attack, stroke, type 2 diabetes, and obesity-related cancers. In addition to exercise, following the Pritikin Eating Plan can help reduce your risk.  Decoding Lab Results  Clinical staff  conducted group or individual video education with verbal and written material and guidebook.  Patient learns that lab test reflects one measurement whose values change over time and are influenced by many factors, including medication, stress, sleep, exercise, food, hydration, pre-existing medical conditions, and more. It is recommended to use the knowledge from this video to become more involved with your lab results and evaluate your numbers to speak with your doctor.   Diseases of Our Time - Overview  Clinical staff conducted group or individual video education with verbal and written material and guidebook.  Patient learns that according to the CDC, 50% to 70% of chronic diseases (such as obesity, type 2 diabetes, elevated lipids, hypertension, and heart disease) are avoidable through lifestyle improvements including healthier food choices, listening to satiety cues, and increased physical activity.  Sleep Disorders Clinical staff conducted group or individual video education with verbal and written material and guidebook.  Patient learns how good quality and duration of sleep are important to overall health and well-being. Patient also learns about sleep disorders and how they impact health along with recommendations to address them, including discussing with a physician.  Nutrition  Dining Out - Part 2 Clinical staff conducted group or individual video education with verbal and written material and guidebook.  Patient learns how to plan ahead and communicate in order to maximize their dining experience in a healthy and nutritious manner. Included are recommended food choices based on the type of restaurant the patient is visiting.   Fueling a Banker conducted group or individual video education with verbal and written material and guidebook.  There is a strong connection between our food choices and our health. Diseases like obesity and type 2 diabetes are very  prevalent and are in large-part due to lifestyle choices. The Pritikin Eating Plan provides plenty of food and hunger-curbing satisfaction. It is easy to follow, affordable, and helps reduce health risks.  Menu Workshop  Clinical staff conducted group or individual video education with verbal and written material and guidebook.  Patient learns that restaurant meals can sabotage health goals because they are often packed with calories, fat, sodium, and sugar. Recommendations include strategies to plan ahead and to communicate with the manager, chef, or server to help order a healthier meal.  Planning Your Eating Strategy  Clinical staff conducted group or individual video education with verbal and written material and guidebook.  Patient learns about the Pritikin Eating Plan and its benefit of reducing the risk of disease. The Pritikin Eating Plan does not focus on calories. Instead, it emphasizes high-quality, nutrient-rich foods. By knowing the characteristics of the foods, we choose, we can determine their calorie density and make informed decisions.  Targeting Your Nutrition Priorities  Clinical staff conducted group or individual video education with verbal and written material and guidebook.  Patient learns that lifestyle habits have a tremendous impact on disease risk and progression. This video provides eating and physical activity recommendations based on your personal health goals, such as reducing LDL cholesterol, losing weight, preventing or controlling type 2 diabetes, and reducing high blood pressure.  Vitamins and Minerals  Clinical staff conducted group or individual video education with verbal and written material and guidebook.  Patient learns different ways to obtain key vitamins and minerals, including  through a recommended healthy diet. It is important to discuss all supplements you take with your doctor.   Healthy Mind-Set    Smoking Cessation  Clinical staff conducted group  or individual video education with verbal and written material and guidebook.  Patient learns that cigarette smoking and tobacco addiction pose a serious health risk which affects millions of people. Stopping smoking will significantly reduce the risk of heart disease, lung disease, and many forms of cancer. Recommended strategies for quitting are covered, including working with your doctor to develop a successful plan.  Culinary   Becoming a Set designer conducted group or individual video education with verbal and written material and guidebook.  Patient learns that cooking at home can be healthy, cost-effective, quick, and puts them in control. Keys to cooking healthy recipes will include looking at your recipe, assessing your equipment needs, planning ahead, making it simple, choosing cost-effective seasonal ingredients, and limiting the use of added fats, salts, and sugars.  Cooking - Breakfast and Snacks  Clinical staff conducted group or individual video education with verbal and written material and guidebook.  Patient learns how important breakfast is to satiety and nutrition through the entire day. Recommendations include key foods to eat during breakfast to help stabilize blood sugar levels and to prevent overeating at meals later in the day. Planning ahead is also a key component.  Cooking - Educational psychologist conducted group or individual video education with verbal and written material and guidebook.  Patient learns eating strategies to improve overall health, including an approach to cook more at home. Recommendations include thinking of animal protein as a side on your plate rather than center stage and focusing instead on lower calorie dense options like vegetables, fruits, whole grains, and plant-based proteins, such as beans. Making sauces in large quantities to freeze for later and leaving the skin on your vegetables are also recommended to maximize  your experience.  Cooking - Healthy Salads and Dressing Clinical staff conducted group or individual video education with verbal and written material and guidebook.  Patient learns that vegetables, fruits, whole grains, and legumes are the foundations of the Pritikin Eating Plan. Recommendations include how to incorporate each of these in flavorful and healthy salads, and how to create homemade salad dressings. Proper handling of ingredients is also covered. Cooking - Soups and [employer] - Soups and Desserts Clinical staff conducted group or individual video education with verbal and written material and guidebook.  Patient learns that Pritikin soups and desserts make for easy, nutritious, and delicious snacks and meal components that are low in sodium, fat, sugar, and calorie density, while high in vitamins, minerals, and filling fiber. Recommendations include simple and healthy ideas for soups and desserts.   Overview     The Pritikin Solution Program Overview Clinical staff conducted group or individual video education with verbal and written material and guidebook.  Patient learns that the results of the Pritikin Program have been documented in more than 100 articles published in peer-reviewed journals, and the benefits include reducing risk factors for (and, in some cases, even reversing) high cholesterol, high blood pressure, type 2 diabetes, obesity, and more! An overview of the three key pillars of the Pritikin Program will be covered: eating well, doing regular exercise, and having a healthy mind-set.  WORKSHOPS  Exercise: Exercise Basics: Building Your Action Plan Clinical staff led group instruction and group discussion with PowerPoint presentation and patient guidebook. To enhance the  learning environment the use of posters, models and videos may be added. At the conclusion of this workshop, patients will comprehend the difference between physical activity and exercise, as well  as the benefits of incorporating both, into their routine. Patients will understand the FITT (Frequency, Intensity, Time, and Type) principle and how to use it to build an exercise action plan. In addition, safety concerns and other considerations for exercise and cardiac rehab will be addressed by the presenter. The purpose of this lesson is to promote a comprehensive and effective weekly exercise routine in order to improve patients' overall level of fitness.   Managing Heart Disease: Your Path to a Healthier Heart Clinical staff led group instruction and group discussion with PowerPoint presentation and patient guidebook. To enhance the learning environment the use of posters, models and videos may be added.At the conclusion of this workshop, patients will understand the anatomy and physiology of the heart. Additionally, they will understand how Pritikin's three pillars impact the risk factors, the progression, and the management of heart disease.  The purpose of this lesson is to provide a high-level overview of the heart, heart disease, and how the Pritikin lifestyle positively impacts risk factors.  Exercise Biomechanics Clinical staff led group instruction and group discussion with PowerPoint presentation and patient guidebook. To enhance the learning environment the use of posters, models and videos may be added. Patients will learn how the structural parts of their bodies function and how these functions impact their daily activities, movement, and exercise. Patients will learn how to promote a neutral spine, learn how to manage pain, and identify ways to improve their physical movement in order to promote healthy living. The purpose of this lesson is to expose patients to common physical limitations that impact physical activity. Participants will learn practical ways to adapt and manage aches and pains, and to minimize their effect on regular exercise. Patients will learn how to  maintain good posture while sitting, walking, and lifting.  Balance Training and Fall Prevention  Clinical staff led group instruction and group discussion with PowerPoint presentation and patient guidebook. To enhance the learning environment the use of posters, models and videos may be added. At the conclusion of this workshop, patients will understand the importance of their sensorimotor skills (vision, proprioception, and the vestibular system) in maintaining their ability to balance as they age. Patients will apply a variety of balancing exercises that are appropriate for their current level of function. Patients will understand the common causes for poor balance, possible solutions to these problems, and ways to modify their physical environment in order to minimize their fall risk. The purpose of this lesson is to teach patients about the importance of maintaining balance as they age and ways to minimize their risk of falling.  WORKSHOPS   Nutrition:  Fueling a Ship broker led group instruction and group discussion with PowerPoint presentation and patient guidebook. To enhance the learning environment the use of posters, models and videos may be added. Patients will review the foundational principles of the Pritikin Eating Plan and understand what constitutes a serving size in each of the food groups. Patients will also learn Pritikin-friendly foods that are better choices when away from home and review make-ahead meal and snack options. Calorie density will be reviewed and applied to three nutrition priorities: weight maintenance, weight loss, and weight gain. The purpose of this lesson is to reinforce (in a group setting) the key concepts around what patients are recommended to eat and  how to apply these guidelines when away from home by planning and selecting Pritikin-friendly options. Patients will understand how calorie density may be adjusted for different weight management  goals.  Mindful Eating  Clinical staff led group instruction and group discussion with PowerPoint presentation and patient guidebook. To enhance the learning environment the use of posters, models and videos may be added. Patients will briefly review the concepts of the Pritikin Eating Plan and the importance of low-calorie dense foods. The concept of mindful eating will be introduced as well as the importance of paying attention to internal hunger signals. Triggers for non-hunger eating and techniques for dealing with triggers will be explored. The purpose of this lesson is to provide patients with the opportunity to review the basic principles of the Pritikin Eating Plan, discuss the value of eating mindfully and how to measure internal cues of hunger and fullness using the Hunger Scale. Patients will also discuss reasons for non-hunger eating and learn strategies to use for controlling emotional eating.  Targeting Your Nutrition Priorities Clinical staff led group instruction and group discussion with PowerPoint presentation and patient guidebook. To enhance the learning environment the use of posters, models and videos may be added. Patients will learn how to determine their genetic susceptibility to disease by reviewing their family history. Patients will gain insight into the importance of diet as part of an overall healthy lifestyle in mitigating the impact of genetics and other environmental insults. The purpose of this lesson is to provide patients with the opportunity to assess their personal nutrition priorities by looking at their family history, their own health history and current risk factors. Patients will also be able to discuss ways of prioritizing and modifying the Pritikin Eating Plan for their highest risk areas  Menu  Clinical staff led group instruction and group discussion with PowerPoint presentation and patient guidebook. To enhance the learning environment the use of posters,  models and videos may be added. Using menus brought in from [employer], or printed from [employer], patients will apply the Pritikin dining out guidelines that were presented in the [employer] video. Patients will also be able to practice these guidelines in a variety of provided scenarios. The purpose of this lesson is to provide patients with the opportunity to practice hands-on learning of the Pritikin Dining Out guidelines with actual menus and practice scenarios.  Label Reading Clinical staff led group instruction and group discussion with PowerPoint presentation and patient guidebook. To enhance the learning environment the use of posters, models and videos may be added. Patients will review and discuss the Pritikin label reading guidelines presented in Pritikin's Label Reading Educational series video. Using fool labels brought in from local grocery stores and markets, patients will apply the label reading guidelines and determine if the packaged food meet the Pritikin guidelines. The purpose of this lesson is to provide patients with the opportunity to review, discuss, and practice hands-on learning of the Pritikin Label Reading guidelines with actual packaged food labels. Cooking School  Pritikin's [employer] are designed to teach patients ways to prepare quick, simple, and affordable recipes at home. The importance of nutrition's role in chronic disease risk reduction is reflected in its emphasis in the overall Pritikin program. By learning how to prepare essential core Pritikin Eating Plan recipes, patients will increase control over what they eat; be able to customize the flavor of foods without the use of added salt, sugar, or fat; and improve the quality of the  food they consume. By learning a set of core recipes which are easily assembled, quickly prepared, and affordable, patients are more likely to prepare more healthy foods at home. These workshops  focus on convenient breakfasts, simple entres, side dishes, and desserts which can be prepared with minimal effort and are consistent with nutrition recommendations for cardiovascular risk reduction. Cooking [employer] are taught by a Armed forces logistics/support/administrative officer (RD) who has been trained by the [employer]. The chef or RD has a clear understanding of the importance of minimizing - if not completely eliminating - added fat, sugar, and sodium in recipes. Throughout the series of Cooking School Workshop sessions, patients will learn about healthy ingredients and efficient methods of cooking to build confidence in their capability to prepare    Cooking School weekly topics:  Adding Flavor- Sodium-Free  Fast and Healthy Breakfasts  Powerhouse Plant-Based Proteins  Satisfying Salads and Dressings  Simple Sides and Sauces  International Cuisine-Spotlight on the [employer] Zones  Delicious Desserts  Savory Soups  [employer] - Meals in a Astronomer Appetizers and Snacks  Comforting Weekend Breakfasts  One-Pot Wonders   Fast Evening Meals  Landscape architect Your Pritikin Plate  WORKSHOPS   Healthy Mindset (Psychosocial):  Focused Goals, Sustainable Changes Clinical staff led group instruction and group discussion with PowerPoint presentation and patient guidebook. To enhance the learning environment the use of posters, models and videos may be added. Patients will be able to apply effective goal setting strategies to establish at least one personal goal, and then take consistent, meaningful action toward that goal. They will learn to identify common barriers to achieving personal goals and develop strategies to overcome them. Patients will also gain an understanding of how our mind-set can impact our ability to achieve goals and the importance of cultivating a positive and growth-oriented mind-set. The purpose of this lesson is to provide patients with a deeper  understanding of how to set and achieve personal goals, as well as the tools and strategies needed to overcome common obstacles which may arise along the way.  From Head to Heart: The Power of a Healthy Outlook  Clinical staff led group instruction and group discussion with PowerPoint presentation and patient guidebook. To enhance the learning environment the use of posters, models and videos may be added. Patients will be able to recognize and describe the impact of emotions and mood on physical health. They will discover the importance of self-care and explore self-care practices which may work for them. Patients will also learn how to utilize the 4 C's to cultivate a healthier outlook and better manage stress and challenges. The purpose of this lesson is to demonstrate to patients how a healthy outlook is an essential part of maintaining good health, especially as they continue their cardiac rehab journey.  Healthy Sleep for a Healthy Heart Clinical staff led group instruction and group discussion with PowerPoint presentation and patient guidebook. To enhance the learning environment the use of posters, models and videos may be added. At the conclusion of this workshop, patients will be able to demonstrate knowledge of the importance of sleep to overall health, well-being, and quality of life. They will understand the symptoms of, and treatments for, common sleep disorders. Patients will also be able to identify daytime and nighttime behaviors which impact sleep, and they will be able to apply these tools to help manage sleep-related challenges. The purpose of this lesson is to provide patients with a general  overview of sleep and outline the importance of quality sleep. Patients will learn about a few of the most common sleep disorders. Patients will also be introduced to the concept of "sleep hygiene," and discover ways to self-manage certain sleeping problems through simple daily behavior changes.  Finally, the workshop will motivate patients by clarifying the links between quality sleep and their goals of heart-healthy living.   Recognizing and Reducing Stress Clinical staff led group instruction and group discussion with PowerPoint presentation and patient guidebook. To enhance the learning environment the use of posters, models and videos may be added. At the conclusion of this workshop, patients will be able to understand the types of stress reactions, differentiate between acute and chronic stress, and recognize the impact that chronic stress has on their health. They will also be able to apply different coping mechanisms, such as reframing negative self-talk. Patients will have the opportunity to practice a variety of stress management techniques, such as deep abdominal breathing, progressive muscle relaxation, and/or guided imagery.  The purpose of this lesson is to educate patients on the role of stress in their lives and to provide healthy techniques for coping with it.  Learning Barriers/Preferences:  Learning Barriers/Preferences - 08/15/23 1430       Learning Barriers/Preferences   Learning Barriers None    Learning Preferences Audio;Group Instruction;Individual Instruction;Pictoral;Skilled Demonstration;Verbal Instruction;Written Material;Video             Education Topics:  Knowledge Questionnaire Score:  Knowledge Questionnaire Score - 08/15/23 1431       Knowledge Questionnaire Score   Pre Score 22/24             Core Components/Risk Factors/Patient Goals at Admission:  Personal Goals and Risk Factors at Admission - 08/15/23 1430       Core Components/Risk Factors/Patient Goals on Admission    Weight Management Yes;Obesity    Intervention Weight Management: Provide education and appropriate resources to help participant work on and attain dietary goals.;Weight Management: Develop a combined nutrition and exercise program designed to reach desired caloric  intake, while maintaining appropriate intake of nutrient and fiber, sodium and fats, and appropriate energy expenditure required for the weight goal.;Weight Management/Obesity: Establish reasonable short term and long term weight goals.;Obesity: Provide education and appropriate resources to help participant work on and attain dietary goals.    Expected Outcomes Short Term: Continue to assess and modify interventions until short term weight is achieved;Long Term: Adherence to nutrition and physical activity/exercise program aimed toward attainment of established weight goal;Understanding recommendations for meals to include 15-35% energy as protein, 25-35% energy from fat, 35-60% energy from carbohydrates, less than 200mg  of dietary cholesterol, 20-35 gm of total fiber daily;Understanding of distribution of calorie intake throughout the day with the consumption of 4-5 meals/snacks    Hypertension Yes    Intervention Provide education on lifestyle modifcations including regular physical activity/exercise, weight management, moderate sodium restriction and increased consumption of fresh fruit, vegetables, and low fat dairy, alcohol  moderation, and smoking cessation.;Monitor prescription use compliance.    Expected Outcomes Short Term: Continued assessment and intervention until BP is < 140/45mm HG in hypertensive participants. < 130/32mm HG in hypertensive participants with diabetes, heart failure or chronic kidney disease.;Long Term: Maintenance of blood pressure at goal levels.    Lipids Yes    Intervention Provide education and support for participant on nutrition & aerobic/resistive exercise along with prescribed medications to achieve LDL 70mg , HDL >40mg .    Expected Outcomes Short Term: Participant states understanding  of desired cholesterol values and is compliant with medications prescribed. Participant is following exercise prescription and nutrition guidelines.;Long Term: Cholesterol controlled with  medications as prescribed, with individualized exercise RX and with personalized nutrition plan. Value goals: LDL < 70mg , HDL > 40 mg.             Core Components/Risk Factors/Patient Goals Review:   Goals and Risk Factor Review     Row Name 08/24/23 1552 09/26/23 0830           Core Components/Risk Factors/Patient Goals Review   Personal Goals Review Weight Management/Obesity;Hypertension;Lipids Weight Management/Obesity;Hypertension;Lipids      Review Isaac [name] is off to a good start to exercise. Vital signs have been stable. Isaac [name] is doing well with exercise. Vital signs have been stable. Isaac [name] has been increasing his workloads      Expected Outcomes Isaac [name] will continue to participate in cardiac rehab for exercise, nutriiton and lifestyle modifications Isaac [name] will continue to participate in cardiac rehab for exercise, nutriiton and lifestyle modifications               Core Components/Risk Factors/Patient Goals at Discharge (Final Review):   Goals and Risk Factor Review - 09/26/23 0830       Core Components/Risk Factors/Patient Goals Review   Personal Goals Review Weight Management/Obesity;Hypertension;Lipids    Review Isaac [name] is doing well with exercise. Vital signs have been stable. Isaac [name] has been increasing his workloads    Expected Outcomes Isaac [name] will continue to participate in cardiac rehab for exercise, nutriiton and lifestyle modifications             ITP Comments:  ITP Comments     Row Name 08/15/23 1423 08/24/23 1533 08/28/23 1023 09/26/23 0827     ITP Comments Dr. Gaylyn Keas medical director. Introduction to pritikin education/intensive cardiac rehab. Initial orientation packet reviewed with patient. 30 Day ITP Review. Isaac [name] started cardiac rehab on 08/20/23. Isaac [name] did well with exercise. 30 Day ITP Review. Isaac [name] started cardiac rehab on 08/20/23. Isaac [name] is off to a good start  with exercise. 30 Day ITP Review. Isaac [name] has good attendance and participaiton in cardiac rehab              Comments: See ITP comments.Monte Antonio RN BSN

## 2023-09-28 ENCOUNTER — Encounter (HOSPITAL_COMMUNITY)
Admission: RE | Admit: 2023-09-28 | Discharge: 2023-09-28 | Disposition: A | Discharge status: Home / Self Care | Source: Ambulatory Visit | Attending: Internal Medicine | Admitting: Internal Medicine

## 2023-09-28 DIAGNOSIS — Z951 Presence of aortocoronary bypass graft: Secondary | ICD-10-CM

## 2023-10-01 ENCOUNTER — Encounter (HOSPITAL_COMMUNITY)
Admission: RE | Admit: 2023-10-01 | Discharge: 2023-10-01 | Disposition: A | Discharge status: Home / Self Care | Source: Ambulatory Visit | Attending: Internal Medicine | Admitting: Internal Medicine

## 2023-10-01 DIAGNOSIS — Z951 Presence of aortocoronary bypass graft: Secondary | ICD-10-CM

## 2023-10-03 ENCOUNTER — Encounter (HOSPITAL_COMMUNITY)
Admission: RE | Admit: 2023-10-03 | Discharge: 2023-10-03 | Disposition: A | Discharge status: Home / Self Care | Source: Ambulatory Visit | Attending: Internal Medicine | Admitting: Internal Medicine

## 2023-10-03 DIAGNOSIS — Z951 Presence of aortocoronary bypass graft: Secondary | ICD-10-CM

## 2023-10-04 ENCOUNTER — Encounter: Payer: Self-pay | Admitting: Internal Medicine

## 2023-10-04 ENCOUNTER — Telehealth: Payer: Self-pay | Admitting: Internal Medicine

## 2023-10-04 DIAGNOSIS — H2513 Age-related nuclear cataract, bilateral: Secondary | ICD-10-CM | POA: Diagnosis not present

## 2023-10-04 DIAGNOSIS — H35372 Puckering of macula, left eye: Secondary | ICD-10-CM | POA: Diagnosis not present

## 2023-10-04 DIAGNOSIS — H35371 Puckering of macula, right eye: Secondary | ICD-10-CM | POA: Diagnosis not present

## 2023-10-04 DIAGNOSIS — H524 Presbyopia: Secondary | ICD-10-CM | POA: Diagnosis not present

## 2023-10-04 NOTE — Telephone Encounter (Signed)
This has been addressed in another encounter.  See that encounter for complete details.

## 2023-10-04 NOTE — Telephone Encounter (Signed)
 Pt is scheduled to donate blood today @ 4:30 and wants to make sure it is ok for him to do so, requesting cb

## 2023-10-05 ENCOUNTER — Encounter (HOSPITAL_COMMUNITY)
Admission: RE | Admit: 2023-10-05 | Discharge: 2023-10-05 | Disposition: A | Discharge status: Home / Self Care | Source: Ambulatory Visit | Attending: Internal Medicine | Admitting: Internal Medicine

## 2023-10-05 DIAGNOSIS — Z951 Presence of aortocoronary bypass graft: Secondary | ICD-10-CM | POA: Insufficient documentation

## 2023-10-08 ENCOUNTER — Encounter (HOSPITAL_COMMUNITY)
Admission: RE | Admit: 2023-10-08 | Discharge: 2023-10-08 | Disposition: A | Discharge status: Home / Self Care | Source: Ambulatory Visit | Attending: Internal Medicine | Admitting: Internal Medicine

## 2023-10-08 DIAGNOSIS — Z951 Presence of aortocoronary bypass graft: Secondary | ICD-10-CM

## 2023-10-10 ENCOUNTER — Encounter (HOSPITAL_COMMUNITY)
Admission: RE | Admit: 2023-10-10 | Discharge: 2023-10-10 | Disposition: A | Discharge status: Home / Self Care | Source: Ambulatory Visit | Attending: Internal Medicine

## 2023-10-10 DIAGNOSIS — Z951 Presence of aortocoronary bypass graft: Secondary | ICD-10-CM

## 2023-10-11 ENCOUNTER — Ambulatory Visit
Admission: RE | Admit: 2023-10-11 | Discharge: 2023-10-11 | Disposition: A | Discharge status: Home / Self Care | Payer: Self-pay | Source: Ambulatory Visit | Attending: Family Medicine | Admitting: Family Medicine

## 2023-10-11 ENCOUNTER — Other Ambulatory Visit: Payer: Self-pay

## 2023-10-11 VITALS — BP 140/88 | HR 80 | Temp 98.0°F | Resp 17

## 2023-10-11 DIAGNOSIS — N3001 Acute cystitis with hematuria: Secondary | ICD-10-CM | POA: Diagnosis not present

## 2023-10-11 LAB — POCT URINALYSIS DIP (MANUAL ENTRY)
Bilirubin, UA: NEGATIVE
Glucose, UA: NEGATIVE mg/dL
Nitrite, UA: POSITIVE — AB
Protein Ur, POC: 100 mg/dL — AB
Spec Grav, UA: 1.02 (ref 1.010–1.025)
Urobilinogen, UA: 1 U/dL
pH, UA: 5.5 (ref 5.0–8.0)

## 2023-10-11 MED ORDER — SULFAMETHOXAZOLE-TRIMETHOPRIM 800-160 MG PO TABS
1.0000 | ORAL_TABLET | Freq: Two times a day (BID) | ORAL | 0 refills | Status: AC
Start: 2023-10-11 — End: 2023-10-18

## 2023-10-11 NOTE — Discharge Instructions (Addendum)
 The clinic will contact you with results of the urine culture done today if positive.  Start Bactrim twice daily for 7 days.  Lots of rest and fluids.  Follow-up with your PCP 2 to 3 days for recheck.  Please go to the ER for any worsening symptoms.  Hope you feel better soon!

## 2023-10-11 NOTE — ED Triage Notes (Signed)
 Pt c/o bladder pressure started 3 wks ago. Pt states he drinks a lot of water. Pt states has had oliguria w/tingling sensation while urinating. Pt states has an apt w/urology in July. Pt was told by urologist office sent him here to get tested for UTI.

## 2023-10-11 NOTE — ED Provider Notes (Addendum)
 [facility]    CSN: [id number] Arrival date & time: 10/11/23  1104      History   Chief Complaint Chief Complaint  Patient presents with   Urinary Frequency    I drink at least 6-8 12 once bottle of water.  Lately when I pee it just dribble out.  I have made an appointment to see a Doctor but the girl who booked a appoint in July 22nd  said i should go in and be checked for a UTI. - Entered by patient    HPI Isaac [name] is a 78 y.o. male presents for dysuria.  Patient reports 3 to 4 weeks of urinary burning with urgency.  Denies any frequency, hematuria, fevers, nausea/vomiting, flank pain.  No testicular pain or swelling or penile discharge.  No STD exposure or concern.  Denies history of UTIs.  No history of BPH.  He does have an appointment with urology in July but was advised to get checked for UTI given how far out his appointment is.  He has not taken any OTC medications for symptoms.  No other concerns at this time.   Urinary Frequency    Past Medical History:  Diagnosis Date   Abnormal fasting glucose    Acute right-sided low back pain with right-sided sciatica    Anginal pain (HCC)    Arthritis    R knee- DJD   BMI 35.0-35.9,adult    Complication of anesthesia    tachycardia with ST depression, HTN witth left TKA 04/2010 (neg cardiac w/u)   Coronary artery disease    Difficult intubation    glidescope used 04/06/10   DJD (degenerative joint disease) of knee    Family history of bipolar disorder    Heart murmur    History of kidney stones    HLD (hyperlipidemia)    Overweight    Piriformis syndrome of right side     Patient Active Problem List   Diagnosis Date Noted   Coronary artery disease 06/14/2023   Abnormal EKG 04/27/2023   Chest pain on exertion 04/27/2023   Precordial pain 04/27/2023   Aortic atherosclerosis (HCC) 04/27/2023   Hyperlipidemia 04/27/2023   Angina pectoris (HCC) 04/27/2023   Systolic murmur 04/27/2023   Hypertension  04/27/2023    Past Surgical History:  Procedure Laterality Date   CORONARY ARTERY BYPASS GRAFT N/A 06/14/2023   Procedure: OFF PUMP CORONARY ARTERY BYPASS GRAFTING X 1, USING LEFT INTERNAL MAMMARY ARTERY;  Surgeon: Hilarie Lovely, MD;  Location: [facility] OR;  Service: Open Heart Surgery;  Laterality: N/A;   JOINT REPLACEMENT  2010   L TKA   LEFT HEART CATH AND CORONARY ANGIOGRAPHY N/A 06/01/2023   Procedure: LEFT HEART CATH AND CORONARY ANGIOGRAPHY;  Surgeon: Swaziland, Peter M, MD;  Location: [facility] INVASIVE CV LAB;  Service: Cardiovascular;  Laterality: N/A;   TEE WITHOUT CARDIOVERSION N/A 06/14/2023   Procedure: TRANSESOPHAGEAL ECHOCARDIOGRAM (TEE);  Surgeon: Hilarie Lovely, MD;  Location: [facility] OR;  Service: Open Heart Surgery;  Laterality: N/A;   TOTAL KNEE ARTHROPLASTY Right 03/19/2013   Procedure: TOTAL KNEE ARTHROPLASTY;  Surgeon: Ferd Householder, MD;  Location: [facility] OR;  Service: Orthopedics;  Laterality: Right;   TOTAL KNEE ARTHROPLASTY Bilateral        Home Medications    Prior to Admission medications   Medication Sig Start Date End Date Taking? Authorizing Provider  sulfamethoxazole-trimethoprim (BACTRIM DS) 800-160 MG tablet Take 1 tablet by mouth 2 (two) times daily for 7 days.  10/11/23 10/18/23 Yes Alleen Arbour, NP  aspirin  EC 325 MG tablet Take 1 tablet (325 mg total) by mouth daily. 06/20/23   Allegra Arch, PA-C  atorvastatin  (LIPITOR) 40 MG tablet Take 1 tablet (40 mg total) by mouth daily. 08/17/23   Ava Boatman, NP  Ibuprofen-diphenhydrAMINE  HCl (ADVIL PM) 200-25 MG CAPS Take 2 tablets by mouth at bedtime.    [provider]  MAGNESIUM  PO Take 1 tablet by mouth at bedtime.    [provider]  metoprolol  tartrate (LOPRESSOR ) 50 MG tablet Take 1 tablet (50 mg total) by mouth 2 (two) times daily. 08/17/23   Ava Boatman, NP  Multiple Vitamin (MULTI VITAMIN) TABS Take 1 tablet by mouth at bedtime.    [provider]  traMADol   (ULTRAM ) 50 MG tablet Take 1 tablet (50 mg total) by mouth every 6 (six) hours as needed for severe pain (pain score 7-10). Patient not taking: Reported on 08/15/2023 06/20/23   Zimmerman, Donielle M, PA-C  triamcinolone  cream (KENALOG ) 0.1 % Apply 1 Application topically 2 (two) times daily. 09/14/23   Adolph Hoop, PA-C  TURMERIC PO Take 1 tablet by mouth at bedtime.    [provider]    Family History Family History  Problem Relation Age of Onset   Other Mother        toxic septic shock colon   Cancer Father        breast   Bipolar disorder Father    Suicidality Brother    COPD Brother     Social History Social History   Tobacco Use   Smoking status: Former    Current packs/day: 0.00    Types: Cigarettes    Quit date: 03/11/1995    Years since quitting: 28.6   Smokeless tobacco: Never  Vaping Use   Vaping status: Never Used  Substance Use Topics   Alcohol  use: Yes    Comment: maybe 1 beer per week   Drug use: No     Allergies   Patient has no known allergies.   Review of Systems Review of Systems  Genitourinary:  Positive for dysuria and frequency.     Physical Exam Triage Vital Signs ED Triage Vitals  Encounter Vitals Group     BP 10/11/23 1130 (!) 140/88     Systolic BP Percentile --      Diastolic BP Percentile --      Pulse Rate 10/11/23 1130 80     Resp 10/11/23 1130 17     Temp 10/11/23 1130 98 F (36.7 C)     Temp Source 10/11/23 1130 Oral     SpO2 10/11/23 1130 95 %     Weight --      Height --      Head Circumference --      Peak Flow --      Pain Score 10/11/23 1127 0     Pain Loc --      Pain Education --      Exclude from Growth Chart --    No data found.  Updated Vital Signs BP (!) 140/88   Pulse 80   Temp 98 F (36.7 C) (Oral)   Resp 17   SpO2 95%   Visual Acuity Right Eye Distance:   Left Eye Distance:   Bilateral Distance:    Right Eye Near:   Left Eye Near:    Bilateral Near:     Physical Exam Vitals and  nursing note reviewed.  Constitutional:      Appearance: Normal appearance.  HENT:     Head: Normocephalic and atraumatic.  Eyes:     Pupils: Pupils are equal, round, and reactive to light.  Cardiovascular:     Rate and Rhythm: Normal rate.  Pulmonary:     Effort: Pulmonary effort is normal.  Abdominal:     Tenderness: There is no right CVA tenderness or left CVA tenderness.  Skin:    General: Skin is warm and dry.  Neurological:     General: No focal deficit present.     Mental Status: He is alert and oriented to person, place, and time.  Psychiatric:        Mood and Affect: Mood normal.        Behavior: Behavior normal.      UC Treatments / Results  Labs (all labs ordered are listed, but only abnormal results are displayed) Labs Reviewed  POCT URINALYSIS DIP (MANUAL ENTRY) - Abnormal; Notable for the following components:      Result Value   Ketones, POC UA trace (5) (*)    Blood, UA small (*)    Protein Ur, POC =100 (*)    Nitrite, UA Positive (*)    Leukocytes, UA Trace (*)    All other components within normal limits  URINE CULTURE   tains abnormal data Basic metabolic panel Order: [id number]  Status: Final result     Next appt: 10/12/2023 at 01:45 PM in Cardiac Rehabilitation ([facility])   Test Result Released: Yes (seen)   0 Result Notes          Component Ref Range & Units (hover) 3 mo ago (06/19/23) 3 mo ago (06/18/23) 3 mo ago (06/17/23) 3 mo ago (06/15/23) 3 mo ago (06/15/23) 3 mo ago (06/14/23) 3 mo ago (06/14/23)  Sodium 134 Low  135 136 132 Low  135 135 137  Potassium 4.1 4.1 3.9 3.7 4.2 3.8 3.9  Chloride 101 103 103 103 106 107   CO2 21 Low  23 23 21  Low  20 Low  20 Low    Glucose, Bld 107 High  104 High  CM 105 High  CM 128 High  CM 119 High  CM 132 High  CM   Comment: Glucose reference range applies only to samples taken after fasting for at least 8 hours.  BUN 21 26 High  23 15 16 19    Creatinine, Ser 0.76 0.76 0.91 0.74 0.69 0.67    Calcium  8.7 Low  8.7 Low  8.8 Low  8.3 Low  8.3 Low  8.0 Low    GFR, Estimated >60 >60 CM >60 CM >60 CM >60 CM >60 CM   Comment: (NOTE) Calculated using the CKD-EPI Creatinine Equation (2021)  Anion gap 12 9 CM 10 CM 8 CM 9 CM 8 CM   Comment: Performed at [facility], [address] [place], [place] [address]  Resulting Agency [facility] CL[facility] LAB        Specimen Collected: 06/19/23 03:13 Last Resulted: 06/19/23 04:44   EKG   Radiology No results found.  Procedures Procedures (including critical care time)  Medications Ordered in UC Medications - No data to display  Initial Impression / Assessment and Plan / UC Course  I have reviewed the triage vital signs and the nursing notes.  Pertinent labs & imaging results that were  available during my care of the patient were reviewed by me and considered in my medical decision making (see chart for details).     Reviewed exam and symptoms with patient.  No red flags.  Urine positive for UTI, will culture and start Bactrim.  No signs of Pilo on exam.  Encouraged rest fluids and PCP follow-up 2 to 3 days for recheck.  He will also follow-up with his urologist at his scheduled appointment in July.  ER precautions reviewed and patient verbalized understanding. Final Clinical Impressions(s) / UC Diagnoses   Final diagnoses:  Acute cystitis with hematuria   Discharge Instructions      The clinic will contact you with results of the urine culture done today if positive.  Start Bactrim twice daily for 7 days.  Lots of rest and fluids.  Follow-up with your PCP 2 to 3 days for recheck.  Please go to the ER for any worsening symptoms.  Hope you feel better soon!  ED Prescriptions     Medication Sig Dispense Auth. Provider   sulfamethoxazole-trimethoprim (BACTRIM DS) 800-160 MG tablet Take 1 tablet by mouth 2 (two) times daily for 7 days. 14 tablet Ksean Vale, Jodi R, NP       PDMP not reviewed this encounter.   Alleen Arbour, NP 10/11/23 1159    Alleen Arbour, NP 10/11/23 1159

## 2023-10-12 ENCOUNTER — Encounter (HOSPITAL_COMMUNITY)
Admission: RE | Admit: 2023-10-12 | Discharge: 2023-10-12 | Disposition: A | Discharge status: Home / Self Care | Source: Ambulatory Visit | Attending: Internal Medicine

## 2023-10-12 DIAGNOSIS — Z951 Presence of aortocoronary bypass graft: Secondary | ICD-10-CM | POA: Diagnosis not present

## 2023-10-14 LAB — URINE CULTURE: Culture: >=100000 — AB

## 2023-10-15 ENCOUNTER — Encounter (HOSPITAL_COMMUNITY)
Admission: RE | Admit: 2023-10-15 | Discharge: 2023-10-15 | Disposition: A | Discharge status: Home / Self Care | Source: Ambulatory Visit | Attending: Internal Medicine

## 2023-10-15 DIAGNOSIS — B961 Klebsiella pneumoniae [K. pneumoniae] as the cause of diseases classified elsewhere: Secondary | ICD-10-CM | POA: Diagnosis not present

## 2023-10-15 DIAGNOSIS — N3090 Cystitis, unspecified without hematuria: Secondary | ICD-10-CM | POA: Diagnosis not present

## 2023-10-15 DIAGNOSIS — Z951 Presence of aortocoronary bypass graft: Secondary | ICD-10-CM

## 2023-10-15 DIAGNOSIS — R339 Retention of urine, unspecified: Secondary | ICD-10-CM | POA: Diagnosis not present

## 2023-10-15 DIAGNOSIS — N390 Urinary tract infection, site not specified: Secondary | ICD-10-CM | POA: Diagnosis not present

## 2023-10-16 ENCOUNTER — Ambulatory Visit: Payer: Self-pay

## 2023-10-17 ENCOUNTER — Encounter (HOSPITAL_COMMUNITY)
Admission: RE | Admit: 2023-10-17 | Discharge: 2023-10-17 | Disposition: A | Discharge status: Home / Self Care | Source: Ambulatory Visit | Attending: Internal Medicine | Admitting: Internal Medicine

## 2023-10-17 ENCOUNTER — Ambulatory Visit: Payer: PPO | Attending: Internal Medicine | Admitting: Internal Medicine

## 2023-10-17 ENCOUNTER — Encounter: Payer: Self-pay | Admitting: Internal Medicine

## 2023-10-17 VITALS — BP 104/60 | HR 63 | Ht 68.0 in | Wt 218.4 lb

## 2023-10-17 DIAGNOSIS — I2510 Atherosclerotic heart disease of native coronary artery without angina pectoris: Secondary | ICD-10-CM

## 2023-10-17 DIAGNOSIS — Z951 Presence of aortocoronary bypass graft: Secondary | ICD-10-CM | POA: Diagnosis not present

## 2023-10-17 DIAGNOSIS — I493 Ventricular premature depolarization: Secondary | ICD-10-CM

## 2023-10-17 DIAGNOSIS — E785 Hyperlipidemia, unspecified: Secondary | ICD-10-CM

## 2023-10-17 NOTE — Patient Instructions (Signed)
 Medication Instructions:  Your physician recommends that you continue on your current medications as directed. Please refer to the Current Medication list given to you today.  *If you need a refill on your cardiac medications before your next appointment, please call your pharmacy*  Lab Work: CBC at [facility]  If you have labs (blood work) drawn today and your tests are completely normal, you will receive your results only by: MyChart Message (if you have MyChart) OR A paper copy in the mail If you have any lab test that is abnormal or we need to change your treatment, we will call you to review the results.  Testing/Procedures: NONE  Follow-Up: At [facility], you and your health needs are our priority.  As part of our continuing mission to provide you with exceptional heart care, our providers are all part of one team.  This team includes your primary Cardiologist (physician) and Advanced Practice Providers or APPs (Physician Assistants and Nurse Practitioners) who all work together to provide you with the care you need, when you need it.  Your next appointment:   8 month(s)  Provider:   Jann Melody, MD

## 2023-10-17 NOTE — Progress Notes (Signed)
 Cardiology Office Note:  .    Date:  10/17/2023  ID:  Isaac [name], DOB 1946-04-19, MRN [id number] PCP: Ronna Coho, MD  [facility] Providers Cardiologist:  Jann Melody, MD     CC: CAD f/u   History of Present Illness: .    Isaac [name] is Isaac 78 y.o. male with severe coronary artery disease and hyperlipidemia who presents for Isaac follow-up regarding his cardiac rehabilitation and medication management.  He is actively participating in cardiac rehabilitation three times Isaac week, which includes an hour of exercise and nutritional education. He finds the program beneficial, although he notes that the intensity is not overly demanding. He is able to use the elliptical machine, which was where he had his previous cardiac event, and feels he is progressing well.  He underwent coronary artery bypass grafting (CABG), initially planned as Isaac triple bypass, but only two veins were used due to insufficient quality veins from his leg. He is concerned about the adequacy of his blood flow and inquires about the potential need for stents in the future.  He has Isaac history of severe coronary artery disease, including Isaac chronic total occlusion (CTO) of the right coronary artery (RCA) and Isaac right bundle branch block with rare premature ventricular contractions (PVCs).  He has hyperlipidemia with Isaac target LDL goal of less than 55 mg/dL. He mentions that his cholesterol levels are currently excellent.  He is interested in donating blood and inquires about when he can resume this activity.  No current symptoms and feels well overall. He is active, exercises regularly, and has good blood pressure and cholesterol levels.  Discussed the use of AI scribe software for clinical note transcription with the patient, who gave verbal consent to proceed.  Relevant histories: .  Social  - I see his brother; established in 2024  ROS: As per HPI.   Studies Reviewed: .     Cardiac Studies &  Procedures   ______________________________________________________________________________________________ CARDIAC CATHETERIZATION  CARDIAC CATHETERIZATION 06/01/2023  Conclusion   Dist LM to Ost LAD lesion is 90% stenosed.   Prox RCA lesion is 100% stenosed.   Ost LM to Ost LAD lesion is 35% stenosed with 35% stenosed side branch in Ost Cx.   The left ventricular systolic function is normal.   LV end diastolic pressure is normal.   The left ventricular ejection fraction is 55-65% by visual estimate.  Severe 2 vessel CAD. There is Isaac severe ostial LAD stenosis. The RCA has Isaac CTO proximally and is well collateralized Normal LV function Normal LVEDP  Plan: will refer to CT surgery for CABG  Findings Coronary Findings Diagnostic  Dominance: Right  Left Main Ost LM to Ost LAD lesion is 35% stenosed with 35% stenosed side branch in Ost Cx. Dist LM to Ost LAD lesion is 90% stenosed.  Right Coronary Artery Prox RCA lesion is 100% stenosed. The lesion is chronically occluded with left-to-right and right-to-right collateral flow.  Right Posterior Descending Artery Collaterals RPDA filled by collaterals from Dist LAD.  Intervention  No interventions have been documented.     ECHOCARDIOGRAM  ECHOCARDIOGRAM COMPLETE 06/05/2023  Narrative ECHOCARDIOGRAM REPORT    Patient Name:   Isaac [name] Date of Exam: 06/05/2023 Medical Rec #:  [id number]       Height:       70.0 in Accession #:    [id number]      Weight:       222.0 lb Date of Birth:  [birth year]      BSA:          2.182 m Patient Age:    77 years        BP:           150/84 mmHg Patient Gender: M               HR:           66 bpm. Exam Location:  [address]  Procedure: 2D Echo, 3D Echo, Cardiac Doppler, Color Doppler and Strain Analysis  Indications:    R01.1 Murmur  History:        Patient has no prior history of Echocardiogram examinations. CAD, Abnormal ECG, PAD, Arrythmias:PVC,  Signs/Symptoms:Chest Pain, Shortness of Breath and Murmur; Risk Factors:Dyslipidemia, Hypertension and Former Smoker. Pre-Operative Eval for CABG.  Sonographer:    Ewing Holiday RDCS Referring Phys: [facility] Isaac [name]  IMPRESSIONS   1. Left ventricular ejection fraction, by estimation, is 60 to 65%. The left ventricle has normal function. The left ventricle has no regional wall motion abnormalities. Left ventricular diastolic parameters were normal. GLS -22.8%. 2. Right ventricular systolic function is normal. The right ventricular size is normal. 3. The mitral valve is normal in structure. No evidence of mitral valve regurgitation. No evidence of mitral stenosis. 4. The aortic valve is normal in structure. There is moderate calcification of the aortic valve. Aortic valve regurgitation is not visualized. No aortic stenosis is present. 5. Aortic dilatation noted. There is mild dilatation of the ascending aorta, measuring 39 mm. 6. The inferior vena cava is normal in size with greater than 50% respiratory variability, suggesting right atrial pressure of 3 mmHg.  FINDINGS Left Ventricle: Left ventricular ejection fraction, by estimation, is 60 to 65%. The left ventricle has normal function. The left ventricle has no regional wall motion abnormalities. The left ventricular internal cavity size was normal in size. There is no left ventricular hypertrophy. Left ventricular diastolic parameters were normal.  Right Ventricle: The right ventricular size is normal. No increase in right ventricular wall thickness. Right ventricular systolic function is normal.  Left Atrium: Left atrial size was normal in size.  Right Atrium: Right atrial size was normal in size.  Pericardium: There is no evidence of pericardial effusion.  Mitral Valve: The mitral valve is normal in structure. No evidence of mitral valve regurgitation. No evidence of mitral valve stenosis.  Tricuspid Valve: The tricuspid  valve is normal in structure. Tricuspid valve regurgitation is not demonstrated. No evidence of tricuspid stenosis.  Aortic Valve: The aortic valve is normal in structure. There is moderate calcification of the aortic valve. Aortic valve regurgitation is not visualized. No aortic stenosis is present. Aortic valve mean gradient measures 8.0 mmHg. Aortic valve peak gradient measures 15.1 mmHg. Aortic valve area, by VTI measures 2.00 cm.  Pulmonic Valve: The pulmonic valve was normal in structure. Pulmonic valve regurgitation is not visualized. No evidence of pulmonic stenosis.  Aorta: Aortic dilatation noted. There is mild dilatation of the ascending aorta, measuring 39 mm.  Venous: The inferior vena cava is normal in size with greater than 50% respiratory variability, suggesting right atrial pressure of 3 mmHg.  IAS/Shunts: No atrial level shunt detected by color flow Doppler.   LEFT VENTRICLE PLAX 2D LVIDd:         4.43 cm   Diastology LVIDs:         2.45 cm   LV e' medial:    6.74 cm/s LV PW:  0.77 cm   LV E/e' medial:  7.9 LV IVS:        0.87 cm   LV e' lateral:   11.20 cm/s LVOT diam:     2.30 cm   LV E/e' lateral: 4.7 LV SV:         84 LV SV Index:   39        2D Longitudinal Strain LVOT Area:     4.15 cm  2D Strain GLS (A2C):   -24.8 % 2D Strain GLS (A3C):   -24.5 % 2D Strain GLS (A4C):   -19.3 % 2D Strain GLS Avg:     -22.8 %  3D Volume EF: 3D EF:        70 % LV EDV:       139 ml LV ESV:       41 ml LV SV:        98 ml  RIGHT VENTRICLE RV Basal diam:  3.20 cm RV S prime:     18.10 cm/s TAPSE (M-mode): 3.0 cm  LEFT ATRIUM           Index        RIGHT ATRIUM           Index LA diam:      4.00 cm 1.83 cm/m   RA Pressure: 3.00 mmHg LA Vol (A2C): 76.1 ml 34.88 ml/m  RA Area:     18.40 cm LA Vol (A4C): 38.1 ml 17.46 ml/m  RA Volume:   52.60 ml  24.11 ml/m AORTIC VALVE AV Area (Vmax):    2.03 cm AV Area (Vmean):   1.90 cm AV Area (VTI):     2.00 cm AV  Vmax:           194.00 cm/s AV Vmean:          133.500 cm/s AV VTI:            0.422 m AV Peak Grad:      15.1 mmHg AV Mean Grad:      8.0 mmHg LVOT Vmax:         95.00 cm/s LVOT Vmean:        61.000 cm/s LVOT VTI:          0.203 m LVOT/AV VTI ratio: 0.48  AORTA Ao Root diam: 3.60 cm Ao Asc diam:  3.90 cm  MITRAL VALVE               TRICUSPID VALVE MV Area (PHT): cm         Estimated RAP:  3.00 mmHg MV Decel Time: 308 msec MV E velocity: 53.10 cm/s  SHUNTS MV Isaac velocity: 76.00 cm/s  Systemic VTI:  0.20 m MV E/Isaac ratio:  0.70        Systemic Diam: 2.30 cm  Aditya Sabharwal Electronically signed by Alwin Baars Signature Date/Time: 06/05/2023/6:49:22 PM    Final   TEE  ECHO INTRAOPERATIVE TEE 06/14/2023  Narrative *INTRAOPERATIVE TRANSESOPHAGEAL REPORT *    Patient Name:   Isaac [name] Date of Exam: 06/14/2023 Medical Rec #:  [id number]       Height:       70.0 in Accession #:    [id number]      Weight:       221.0 lb Date of Birth:  09/20/1945      BSA:          2.18 m Patient Age:    37 years  BP:           141/82 mmHg Patient Gender: M               HR:           86 bpm. Exam Location:  Anesthesiology  Transesophogeal exam was perform intraoperatively during surgical procedure. Patient was closely monitored under general anesthesia during the entirety of examination.  Indications:     CAD Performing Phys: [id number] HARRELL O LIGHTFOOT  Complications: No known complications during this procedure. POST-OP IMPRESSIONS Overall, there were no significant changes from pre CABG.  PRE-OP FINDINGS Left Ventricle: The left ventricle has normal systolic function, with an ejection fraction of 60-65%. The cavity size was normal. There is no increase in left ventricular wall thickness. No evidence of left ventricular regional wall motion abnormalities.   Right Ventricle: The right ventricle has normal systolic function. The cavity was normal. There is no  increase in right ventricular wall thickness.  Left Atrium: Left atrial size was not assessed. No left atrial/left atrial appendage thrombus was detected.  Right Atrium: Right atrial size was not assessed.  Interatrial Septum: No atrial level shunt detected by color flow Doppler.  Pericardium: There is no evidence of pericardial effusion.  Mitral Valve: The mitral valve is normal in structure. Mitral valve regurgitation is trivial by color flow Doppler.  Tricuspid Valve: The tricuspid valve was normal in structure. Tricuspid valve regurgitation was not visualized by color flow Doppler.  Aortic Valve: The aortic valve is tricuspid Aortic valve regurgitation is trivial by color flow Doppler. There is no stenosis of the aortic valve.   Pulmonic Valve: The pulmonic valve was normal in structure. Pulmonic valve regurgitation is trivial by color flow Doppler.   +--------------+--------++ LEFT VENTRICLE         +--------------+--------++ PLAX 2D                +--------------+--------++ LVOT diam:    2.10 cm  +--------------+--------++ LVOT Area:    3.46 cm +--------------+--------++                        +--------------+--------++  +-------------+------------++ AORTIC VALVE              +-------------+------------++ AV Vmax:     184.00 cm/s  +-------------+------------++ AV Vmean:    118.000 cm/s +-------------+------------++ AV VTI:      0.398 m      +-------------+------------++ AV Peak Grad:13.5 mmHg    +-------------+------------++ AV Mean Grad:7.0 mmHg     +-------------+------------++   +--------------+-------+ SHUNTS                +--------------+-------+ Systemic Diam:2.10 cm +--------------+-------+   Jake Mayers MD Electronically signed by Jake Mayers MD Signature Date/Time: 06/14/2023/4:21:02 PM    Final    CT SCANS  CT CORONARY MORPH W/CTA COR W/SCORE 05/09/2023  Addendum  06/02/2023 12:34 AM ADDENDUM REPORT: 06/02/2023 00:32  EXAM: OVER-READ INTERPRETATION  CT CHEST  The following report is an over-read performed by radiologist Dr. Violeta Grey of [facility] on 06/02/2023. This over-read does not include interpretation of cardiac or coronary anatomy or pathology. The coronary calcium  score/coronary CTA interpretation by the cardiologist is attached.  COMPARISON:  None.  FINDINGS: Cardiovascular: There are no significant extracardiac vascular findings.  Mediastinum/Nodes: There are no enlarged lymph nodes within the visualized mediastinum.  Lungs/Pleura: There is no pleural effusion. The visualized lungs appear clear.  Upper abdomen: No significant findings in the  visualized upper abdomen.  Musculoskeletal/Chest wall: No chest wall mass or suspicious osseous findings within the visualized chest.  IMPRESSION: No significant extracardiac findings within the visualized chest.   Electronically Signed By: Violeta Grey M.D. On: 06/02/2023 00:32  Narrative CLINICAL DATA:  78 Year-old Male  EXAM: Cardiac/Coronary  CTA  TECHNIQUE: The patient was scanned on Isaac [employer].  FINDINGS: Scan was triggered in the descending thoracic aorta. Axial non-contrast 3 mm slices were carried out through the heart. The data set was analyzed on Isaac dedicated work station and scored using the Agatson method. Gantry rotation speed was 250 msecs and collimation was .6 mm. 0.8 mg of sl NTG was given. The 3D data set was reconstructed in 5% intervals of the 67-82 % of the R-R cycle. Diastolic phases were analyzed on Isaac dedicated work station using MPR, MIP and VRT modes. The patient received 95 cc of contrast.  Coronary Arteries:  Normal coronary origin.  Right dominance.  Coronary Calcium  Score:  Left main: 95  Left anterior descending artery: 458  Left circumflex artery: 10  Right coronary artery: 112  Total:  675  Percentile: 65th for age, sex, and race matched control.  RCA is Isaac large dominant artery that gives rise to PDA and PLA. Mild non-obstructive mixed plaque (25-49%) in the ostial RCA. Severe 100% stenosis in the proximal RCA modeled as Isaac CTO lesion with potential collateral flow.  Left main is Isaac large artery that gives rise to LAD and LCX arteries. Mild non-obstructive soft plaque (25-49%) in the distal left main.  LAD is Isaac large vessel that gives rise to two diagonal vessels. Mild non-obstructive mixed plaques (25-49%) in the proximal LAD. Mild non-obstructive mixed plaque (25-49%) in the mid LAD. Mild non-obstructive mixed plaque (25-49%) in the D1.  LCX is Isaac non-dominant artery that gives rise to one large OM1 branch. Minimal non-obstructive calcified plaque (1-24%) in the proximal LCX. Minimal non-obstructive mixed plaque (1-24%) in the mid LCX.  Other non-coronary findings:  Aorta: Normal size.  Aortic atherosclerosis.  No dissection.  Main Pulmonary Artery: Normal size of the pulmonary artery.  Systemic Veins: Normal drainage  Normal pulmonary vein drainage into the left atrium.  Normal left atrial appendage without Isaac thrombus.  Interatrial septum with no clear communications.  Chamber dimensions: Normal dimensions.  Aortic Valve:  Tri-leaflet.  AV Calcium  score 266.  Mitral valve: No calcifications  Pericardium: Normal thickness  Extra-cardiac findings: See attached radiology report for non-cardiac structures.  Artifact: Slab artifact  Image quality: mild  IMPRESSION: 1. Coronary calcium  score of 675. This was 65th percentile for age, sex, and race matched control.  2. Normal coronary origin with Right dominance.  3. CAD-RADS 5 Total coronary occlusion (100%). Consider cardiac catheterization or viability assessment. CT-FFR will be sent. Consider symptom-guided anti-ischemic pharmacotherapy as well as risk factor modification per guideline  directed care.  RECOMMENDATIONS: RECOMMENDATIONS The proposed cut-off value of 1,651 AU yielded Isaac 93 % sensitivity and 75 % specificity in grading AS severity in patients with classical low-flow, low-gradient AS. Proposed different cut-off values to define severe AS for men and women as 2,065 AU and 1,274 AU, respectively. The joint European and American recommendations for the assessment of AS consider the aortic valve calcium  score as Isaac continuum - Isaac very high calcium  score suggests severe AS and Isaac low calcium  score suggests severe AS is unlikely.  Florette Hurry, et al. 2017 ESC/EACTS Guidelines for the management of  valvular heart disease. Eur Heart J 2017;38:2739-91.  Coronary artery calcium  (CAC) score is Isaac strong predictor of incident coronary heart disease (CHD) and provides predictive information beyond traditional risk factors. CAC scoring is reasonable to use in the decision to withhold, postpone, or initiate statin therapy in intermediate-risk or selected borderline-risk asymptomatic adults (age 74-75 years and LDL-C >=70 to <190 mg/dL) who do not have diabetes or established atherosclerotic cardiovascular disease (ASCVD).* In intermediate-risk (10-year ASCVD risk >=7.5% to <20%) adults or selected borderline-risk (10-year ASCVD risk >=5% to <7.5%) adults in whom Isaac CAC score is measured for the purpose of making Isaac treatment decision the following recommendations have been made:  If CAC = 0, it is reasonable to withhold statin therapy and reassess in 5 to 10 years, as long as higher risk conditions are absent (diabetes mellitus, family history of premature CHD in first degree relatives (males <55 years; females <65 years), cigarette smoking, LDL >=190 mg/dL or other independent risk factors).  If CAC is 1 to 99, it is reasonable to initiate statin therapy for patients >=47 years of age.  If CAC is >=100 or >=75th percentile, it is reasonable to  initiate statin therapy at any age.  Cardiology referral should be considered for patients with CAC scores =400 or >=75th percentile.  *2018 AHA/ACC/AACVPR/AAPA/ABC/ACPM/ADA/AGS/APhA/ASPC/NLA/PCNA Guideline on the Management of Blood Cholesterol: Isaac Report of the American College of Cardiology/American Heart Association Task Force on Clinical Practice Guidelines. J Am Coll Cardiol. 2019;73(24):3168-3209.  Gloriann Larger, MD  Electronically Signed: By: Gloriann Larger M.D. On: 05/21/2023 12:28     ______________________________________________________________________________________________        Physical Exam:    VS:  BP 104/60   Pulse 63   Ht 5\' 8"  (1.727 m)   Wt 218 lb 6.4 oz (99.1 kg)   SpO2 95%   BMI 33.21 kg/m    Wt Readings from Last 3 Encounters:  10/17/23 218 lb 6.4 oz (99.1 kg)  08/15/23 222 lb 10.6 oz (101 kg)  08/07/23 222 lb (100.7 kg)    Gen: no distress   Neck: No JVD Cardiac: No Rubs or Gallops, no Murmur, RRR +2 radial pulses Respiratory: Clear to auscultation bilaterally, normal effort, normal  respiratory rate GI: Soft, nontender, non-distended  MS: No  edema;  moves all extremities Integument: Skin feels warm Neuro:  At time of evaluation, alert and oriented to person/place/time/situation  Psych: Normal affect, patient feels ok   ASSESSMENT AND PLAN: .    Coronary artery disease with history of CABG Severe LAD disease with CTO RCA, post-CABG to the LAD. Collateral flow from the left side supports the right side. Asymptomatic with good exercise tolerance in cardiac rehab. No immediate need for stenting unless symptoms such as chest pain or exercise intolerance develop, or in case of acute coronary events. Emphasis on aggressive prevention to avoid further blockages. - Continue cardiac rehab and regular exercise. - Consider stenting if symptoms develop or in case of acute coronary events.  Hyperlipidemia LDL goal set to less than  55. Current cholesterol levels are well-controlled. Emphasis on aggressive prevention to avoid further blockages. - Continue current lipid-lowering therapy. - Maintain dietary modifications as advised in cardiac rehab.  Chronic right bundle branch block Chronic condition with no current symptoms.  Premature ventricular contractions (PVCs) Rare PVCs, currently asymptomatic. No immediate intervention required unless symptoms develop.  Follow-up Follow-up plans discussed for monitoring and future appointments. Blood donation considered after July 8th, contingent on normal CBC results and absence  of anemia. - Order CBC before July 8th to assess for anemia- switch to ASA 81 mg PO daily - Schedule follow-up appointment in January 2026, around the anniversary of CABG. - Allow blood donation after July 8th if CBC is normal and no anemia is present.  Gloriann Larger, MD FASE [facility] Cardiologist [facility]  [address] [place], [address] [place], [place] [address] [phone]  10:37 AM

## 2023-10-19 ENCOUNTER — Encounter (HOSPITAL_COMMUNITY)
Admission: RE | Admit: 2023-10-19 | Discharge: 2023-10-19 | Disposition: A | Discharge status: Home / Self Care | Source: Ambulatory Visit | Attending: Internal Medicine | Admitting: Internal Medicine

## 2023-10-19 DIAGNOSIS — Z951 Presence of aortocoronary bypass graft: Secondary | ICD-10-CM

## 2023-10-22 ENCOUNTER — Encounter (HOSPITAL_COMMUNITY)

## 2023-10-24 ENCOUNTER — Encounter (HOSPITAL_COMMUNITY)

## 2023-10-24 NOTE — Progress Notes (Signed)
 Cardiac Individual Treatment Plan  Patient Details  Name: Isaac [name] MRN: [id number] Date of Birth: 1945-06-15 Referring Provider:   Flowsheet Row INTENSIVE CARDIAC REHAB ORIENT from 08/15/2023 in [facility]  Referring Provider Sarah Cumber, MD       Initial Encounter Date:  Flowsheet Row INTENSIVE CARDIAC REHAB ORIENT from 08/15/2023 in [facility]  Date 08/15/23       Visit Diagnosis: 06/14/23 CABG x 1  Patient's Home Medications on Admission:  Current Outpatient Medications:    aspirin  EC 325 MG tablet, Take 1 tablet (325 mg total) by mouth daily., Disp: , Rfl:    atorvastatin  (LIPITOR) 40 MG tablet, Take 1 tablet (40 mg total) by mouth daily., Disp: 90 tablet, Rfl: 3   Ibuprofen-diphenhydrAMINE  HCl (ADVIL PM) 200-25 MG CAPS, Take 2 tablets by mouth at bedtime., Disp: , Rfl:    MAGNESIUM  PO, Take 1 tablet by mouth at bedtime., Disp: , Rfl:    metoprolol  tartrate (LOPRESSOR ) 50 MG tablet, Take 1 tablet (50 mg total) by mouth 2 (two) times daily., Disp: 180 tablet, Rfl: 3   Multiple Vitamin (MULTI VITAMIN) TABS, Take 1 tablet by mouth at bedtime., Disp: , Rfl:    traMADol  (ULTRAM ) 50 MG tablet, Take 1 tablet (50 mg total) by mouth every 6 (six) hours as needed for severe pain (pain score 7-10)., Disp: 24 tablet, Rfl: 0   triamcinolone  cream (KENALOG ) 0.1 %, Apply 1 Application topically 2 (two) times daily., Disp: 30 g, Rfl: 0   TURMERIC PO, Take 1 tablet by mouth at bedtime., Disp: , Rfl:   Past Medical History: Past Medical History:  Diagnosis Date   Abnormal fasting glucose    Acute right-sided low back pain with right-sided sciatica    Anginal pain (HCC)    Arthritis    R knee- DJD   BMI 35.0-35.9,adult    Complication of anesthesia    tachycardia with ST depression, HTN witth left TKA 04/2010 (neg cardiac w/u)   Coronary artery disease     Difficult intubation    glidescope used 04/06/10   DJD (degenerative joint disease) of knee    Family history of bipolar disorder    Heart murmur    History of kidney stones    HLD (hyperlipidemia)    Overweight    Piriformis syndrome of right side     Tobacco Use: Social History   Tobacco Use  Smoking Status Former   Current packs/day: 0.00   Types: Cigarettes   Quit date: 03/11/1995   Years since quitting: 28.6  Smokeless Tobacco Never    Labs: Review Flowsheet  More data may exist      Latest Ref Rng & Units 04/07/2010 05/22/2023 06/13/2023 06/14/2023 08/01/2023  Labs for ITP Cardiac and Pulmonary Rehab  Cholestrol 100 - 199 mg/dL 829 (NOTE) ATP III Classification:      < 200        mg/dL        Desirable     562 - 239     mg/dL        Borderline High     >= 240        mg/dL        High   - - - 130   LDL (calc) 0 - 99 mg/dL 81 (NOTE)  Total Cholesterol/HDL Ratio:CHD Risk  Coronary Heart Disease Risk Table                                       Men       Women         1/2 Average Risk              3.4        3.3             Average Risk              5.0         4.4         2 X Average Risk              9.6        7.1         3 X Average Risk             23.4       11.0 Use the calculated Patient Ratio above and the CHD Risk table  to determine the patient's CHD Risk. ATP III Classification (LDL):      < 100         mg/dL         Optimal     161 - 129     mg/dL         Near or Above Optimal     130 - 159     mg/dL         Borderline High     160 - 189     mg/dL         High      > 096        mg/dL         Very High   - - - 45   Direct LDL 0 - 99 mg/dL - 89  - - -  HDL-C >04 mg/dL 43  - - - 41   Trlycerides 0 - 149 mg/dL 80  - - - 540   Hemoglobin A1c 4.8 - 5.6 % - - 5.5  - -  PH, Arterial 7.35 - 7.45 - - - 7.321  7.375  7.385  7.303  7.293  7.308  -  PCO2 arterial 32 - 48 mmHg - - - 41.9  32.4  34.5  39.2  42.5  44.4  -  Bicarbonate 20.0 - 28.0 mmol/L - - - 21.7   19.1  21.4  19.5  20.6  22.2  -  TCO2 22 - 32 mmol/L - - - 23  20  23  21  22  22  22  21  24  23   -  Acid-base deficit 0.0 - 2.0 mmol/L - - - 4.0  6.0  4.0  6.0  6.0  4.0  -  O2 Saturation % - - - 95  95  97  99  99  100  -    Details       Multiple values from one day are sorted in reverse-chronological order         Capillary Blood Glucose: Lab Results  Component Value Date   GLUCAP 107 (H) 06/20/2023   GLUCAP 127 (H) 06/19/2023   GLUCAP 126 (H) 06/19/2023   GLUCAP 106 (H) 06/19/2023   GLUCAP 105 (H) 06/19/2023     Exercise Target Goals: Exercise Program  Goal: Individual exercise prescription set using results from initial 6 min walk test and THRR while considering  patient's activity barriers and safety.   Exercise Prescription Goal: Initial exercise prescription builds to 30-45 minutes a day of aerobic activity, 2-3 days per week.  Home exercise guidelines will be given to patient during program as part of exercise prescription that the participant will acknowledge.  Activity Barriers & Risk Stratification:  Activity Barriers & Cardiac Risk Stratification - 08/15/23 1426       Activity Barriers & Cardiac Risk Stratification   Activity Barriers Arthritis;Left Knee Replacement;Right Knee Replacement;Balance Concerns;Incisional Pain;Joint Problems;Other (comment)    Comments sternal precautions    Cardiac Risk Stratification High   <5 METs on            6 Minute Walk:  6 Minute Walk     Row Name 08/15/23 1539         6 Minute Walk   Phase Initial     Distance 1560 feet     Walk Time 6 minutes     # of Rest Breaks 0     MPH 2.95     METS 2.5     RPE 12     Perceived Dyspnea  0     VO2 Peak 8.77     Symptoms No     Resting HR 72 bpm     Resting BP 114/62     Resting Oxygen Saturation  97 %     Exercise Oxygen Saturation  during 6 min walk 97 %     Max Ex. HR 90 bpm     Max Ex. BP 138/70     2 Minute Post BP 118/68               Oxygen Initial Assessment:   Oxygen Re-Evaluation:   Oxygen Discharge (Final Oxygen Re-Evaluation):   Initial Exercise Prescription:  Initial Exercise Prescription - 08/15/23 1500       Date of Initial Exercise RX and Referring Provider   Date 08/15/23    Referring Provider Sarah Cumber, MD    Expected Discharge Date 11/07/23      Recumbant Bike   Level 1    RPM 50    Watts 30    Minutes 15    METs 2      NuStep   Level 1    SPM 70    Minutes 15    METs 2      Prescription Details   Frequency (times per week) 3    Duration Progress to 30 minutes of continuous aerobic without signs/symptoms of physical distress      Intensity   THRR 40-80% of Max Heartrate 57-117    Ratings of Perceived Exertion 11-13    Perceived Dyspnea 0-4      Progression   Progression Continue progressive overload as per policy without signs/symptoms or physical distress.      Resistance Training   Training Prescription Yes    Weight 3    Reps 10-15             Perform Capillary Blood Glucose checks as needed.  Exercise Prescription Changes:   Exercise Prescription Changes     Row Name 08/20/23 1633 09/05/23 1634 09/19/23 1618 10/01/23 1650 10/19/23 1636     Response to Exercise   Blood Pressure (Admit) 100/60 112/62 108/60 118/70 116/64   Blood Pressure (Exercise) 120/68 124/62 -- -- 138/70   Blood Pressure (Exit) 100/60  122/64 98/60 114/72 106/71   Heart Rate (Admit) 56 bpm 64 bpm 77 bpm 76 bpm 79 bpm   Heart Rate (Exercise) 92 bpm 88 bpm 113 bpm 97 bpm 97 bpm   Heart Rate (Exit) 65 bpm 64 bpm 88 bpm 75 bpm 76 bpm   Rating of Perceived Exertion (Exercise) 8.5 13 13 13 13    Perceived Dyspnea (Exercise) -- -- -- 0 0   Symptoms 0 0 0 0 0   Comments Pt first day in teh [facility] program. reviewed MET's and goals reviewed MET's and home ExRx reviewed MET's Reviewed MET's and goals   Duration Progress to 30 minutes of  aerobic without signs/symptoms of physical  distress Progress to 30 minutes of  aerobic without signs/symptoms of physical distress Progress to 30 minutes of  aerobic without signs/symptoms of physical distress Progress to 30 minutes of  aerobic without signs/symptoms of physical distress Progress to 30 minutes of  aerobic without signs/symptoms of physical distress   Intensity THRR unchanged THRR unchanged THRR unchanged THRR unchanged THRR unchanged     Progression   Progression Continue to progress workloads to maintain intensity without signs/symptoms of physical distress. Continue to progress workloads to maintain intensity without signs/symptoms of physical distress. Continue to progress workloads to maintain intensity without signs/symptoms of physical distress. Continue to progress workloads to maintain intensity without signs/symptoms of physical distress. Continue to progress workloads to maintain intensity without signs/symptoms of physical distress.   Average METs 2.35 3.1 3.9 3.8 3.8     Resistance Training   Training Prescription Yes Yes No No No   Weight 3 3 3 3 3    Reps 10-15 10-15 10-15 10-15 10-15   Time 10 Minutes 10 Minutes 10 Minutes 10 Minutes 10 Minutes     Recumbant Bike   Level 2 4 -- -- --   RPM 64 52 -- -- --   Watts 20 55 -- -- --   Minutes 15 15 -- -- --   METs 2.2 3.5 -- -- --     NuStep   Level 1 4 4 4 5    SPM 89 89 91 107 --   Minutes 15 15 15 15 15    METs 2.5 --  2.7 last visit 85 spm 2.7 3.1 3.1     Elliptical   Level -- -- 1 1 1    Speed -- -- 1 1 1    Minutes -- -- 15 15 15    METs -- -- 5.1 4.5 4.5     Home Exercise Plan   Plans to continue exercise at -- -- [employer] (comment) [employer] (comment) [employer] (comment)   Frequency -- -- Add 2 additional days to program exercise sessions. Add 2 additional days to program exercise sessions. Add 2 additional days to program exercise sessions.   Initial Home Exercises Provided -- -- 09/19/23 09/19/23 09/19/23             Exercise Comments:   Exercise Comments     Row Name 08/20/23 1638 09/05/23 1639 09/19/23 1623 10/01/23 1652 10/19/23 1644   Exercise Comments Pt first day in the [facility] program. Pt toleraetd exercise well with an average MET level of 2.35. Pt is leanring his THRR, RPE and ExRx. He's off to a great start Reviewed MET's and goals. Pt toleraetd exercise well with an average MET level of 3.1. Pt is feeling good about his goals and is increasing strength and stamina. Reviewed MET's and home ExRx. Pt toleraetd  exercise well with an average MET level of 3.9. Pt is doing very well and is changing equipment. He's dooing great on the elliptical. He plans to exercise on his own by going to [facility], weight and core strength 1-2 days for 30-45  mins a session. Patient is now off sternal precautions. So talked about gradually adding in strength training. Reviewed MET's. Pt tolerated exercise well with an average MET level of 3.8. Pt is doing well and feels good with his exercise. Talked about moving up WL on the nustep next session, staying the same on elliptical because he's close to his THRR. Reviewed MET's and goals. Pt toleraetd exercise well with an average MET level of 3.8. Pt is doing well and feels good with exercise, HR is doing better on theElliptical but he feels good with his level, so no changes yet. He feels good about his goals and is ready to start going back to [employer] on his own, will give him a sheet for weight lifing progression, but he is past his restriction timeframe, well add in weight lifting gradually            Exercise Goals and Review:   Exercise Goals     Row Name 08/15/23 1426             Exercise Goals   Increase Physical Activity Yes       Intervention Provide advice, education, support and counseling about physical activity/exercise needs.;Develop an individualized exercise prescription for aerobic and resistive training based on initial evaluation findings,  risk stratification, comorbidities and participant's personal goals.       Expected Outcomes Short Term: Attend rehab on a regular basis to increase amount of physical activity.;Long Term: Exercising regularly at least 3-5 days a week.;Long Term: Add in home exercise to make exercise part of routine and to increase amount of physical activity.       Increase Strength and Stamina Yes       Intervention Provide advice, education, support and counseling about physical activity/exercise needs.;Develop an individualized exercise prescription for aerobic and resistive training based on initial evaluation findings, risk stratification, comorbidities and participant's personal goals.       Expected Outcomes Short Term: Increase workloads from initial exercise prescription for resistance, speed, and METs.;Short Term: Perform resistance training exercises routinely during rehab and add in resistance training at home;Long Term: Improve cardiorespiratory fitness, muscular endurance and strength as measured by increased METs and functional capacity ( )       Able to understand and use rate of perceived exertion (RPE) scale Yes       Intervention Provide education and explanation on how to use RPE scale       Expected Outcomes Short Term: Able to use RPE daily in rehab to express subjective intensity level;Long Term:  Able to use RPE to guide intensity level when exercising independently       Knowledge and understanding of Target Heart Rate Range (THRR) Yes       Intervention Provide education and explanation of THRR including how the numbers were predicted and where they are located for reference       Expected Outcomes Short Term: Able to state/look up THRR;Short Term: Able to use daily as guideline for intensity in rehab;Long Term: Able to use THRR to govern intensity when exercising independently       Understanding of Exercise Prescription Yes       Intervention Provide education, explanation, and written  materials on patient's individual  exercise prescription       Expected Outcomes Short Term: Able to explain program exercise prescription;Long Term: Able to explain home exercise prescription to exercise independently                Exercise Goals Re-Evaluation :  Exercise Goals Re-Evaluation     Row Name 08/20/23 1637 09/05/23 1637 10/19/23 1641         Exercise Goal Re-Evaluation   Exercise Goals Review Increase Physical Activity;Understanding of Exercise Prescription;Increase Strength and Stamina;Knowledge and understanding of Target Heart Rate Range (THRR);Able to understand and use rate of perceived exertion (RPE) scale Increase Physical Activity;Understanding of Exercise Prescription;Increase Strength and Stamina;Knowledge and understanding of Target Heart Rate Range (THRR);Able to understand and use rate of perceived exertion (RPE) scale Increase Physical Activity;Understanding of Exercise Prescription;Increase Strength and Stamina;Knowledge and understanding of Target Heart Rate Range (THRR);Able to understand and use rate of perceived exertion (RPE) scale     Comments Pt first day in the [facility] program. Pt toleraetd exercise well with an average MET level of 2.35. Pt is leanring his THRR, RPE and ExRx. He's off to a great start Reviewed MET's and goals. Pt toleraetd exercise well with an average MET level of 3.1. Pt is feeling good about his goals and is increasing strength and stamina. Reviewed MET's and goals. Pt toleraetd exercise well with an average MET level of 3.8. Pt is doing well and feels good with exercise, HR is doing better on theElliptical but he feels good with his level, so no changes yet. He feels good about his goals and is ready to start going back to [employer] on his own, will give him a sheet for weight lifing progression, but he is past his restriction timeframe, well add in weight lifting gradually     Expected Outcomes Will continue to monitor pt and progress  workloads as tolerated without sign or symptom Will continue to monitor pt and progress workloads as tolerated without sign or symptom Will continue to monitor pt and progress workloads as tolerated without sign or symptom              Discharge Exercise Prescription (Final Exercise Prescription Changes):  Exercise Prescription Changes - 10/19/23 1636       Response to Exercise   Blood Pressure (Admit) 116/64    Blood Pressure (Exercise) 138/70    Blood Pressure (Exit) 106/71    Heart Rate (Admit) 79 bpm    Heart Rate (Exercise) 97 bpm    Heart Rate (Exit) 76 bpm    Rating of Perceived Exertion (Exercise) 13    Perceived Dyspnea (Exercise) 0    Symptoms 0    Comments Reviewed MET's and goals    Duration Progress to 30 minutes of  aerobic without signs/symptoms of physical distress    Intensity THRR unchanged      Progression   Progression Continue to progress workloads to maintain intensity without signs/symptoms of physical distress.    Average METs 3.8      Resistance Training   Training Prescription No    Weight 3    Reps 10-15    Time 10 Minutes      NuStep   Level 5    Minutes 15    METs 3.1      Elliptical   Level 1    Speed 1    Minutes 15    METs 4.5      Home Exercise Plan   Plans to continue  exercise at [employer] (comment)    Frequency Add 2 additional days to program exercise sessions.    Initial Home Exercises Provided 09/19/23             Nutrition:  Target Goals: Understanding of nutrition guidelines, daily intake of sodium 1500mg , cholesterol 200mg , calories 30% from fat and 7% or less from saturated fats, daily to have 5 or more servings of fruits and vegetables.  Biometrics:  Pre Biometrics - 08/15/23 1424       Pre Biometrics   Waist Circumference 44 inches    Hip Circumference 45 inches    Waist to Hip Ratio 0.98 %    Triceps Skinfold 18 mm    % Body Fat 32.8 %    Grip Strength 32 kg    Flexibility 0 in   could not  reach   Single Leg Stand 4.56 seconds              Nutrition Therapy Plan and Nutrition Goals:  Nutrition Therapy & Goals - 09/21/23 1433       Nutrition Therapy   Diet Heart healthy diet    Drug/Food Interactions Statins/Certain Fruits      Personal Nutrition Goals   Nutrition Goal Patient to identify strategies for reducing cardiovascular risk by attending the [facility] education and nutrition series weekly.   goal in action.   Personal Goal #2 Patient to improve diet quality by using the plate method as a guide for meal planning to include lean protein/plant protein, fruits, vegetables, whole grains, nonfat dairy as part of a well-balanced diet.   goal in action.   Comments Goals in action. Matix has medical history of CABGx1, HTN, hyperlipidemia. Has started making some changes including reduced sodium, reduced processed meats, and cooking more at home. His LDL is at goal <55. He has maintained his weight since starting with our program. Patient will continue to benefit from participation in intensive cardiac rehab for nutrition, exercise, and lifestyle modification.      Intervention Plan   Intervention Prescribe, educate and counsel regarding individualized specific dietary modifications aiming towards targeted core components such as weight, hypertension, lipid management, diabetes, heart failure and other comorbidities.;Nutrition handout(s) given to patient.    Expected Outcomes Short Term Goal: Understand basic principles of dietary content, such as calories, fat, sodium, cholesterol and nutrients.;Long Term Goal: Adherence to prescribed nutrition plan.             Nutrition Assessments:  Nutrition Assessments - 08/21/23 1527       Rate Your Plate Scores   Pre Score 49            MEDIFICTS Score Key: >=70 Need to make dietary changes  40-70 Heart Healthy Diet <= 40 Therapeutic Level Cholesterol Diet   Flowsheet Row INTENSIVE CARDIAC REHAB from 08/20/2023 in  [facility]  Picture Your Plate Total Score on Admission 49      Picture Your Plate Scores: <86 Unhealthy dietary pattern with much room for improvement. 41-50 Dietary pattern unlikely to meet recommendations for good health and room for improvement. 51-60 More healthful dietary pattern, with some room for improvement.  >60 Healthy dietary pattern, although there may be some specific behaviors that could be improved.    Nutrition Goals Re-Evaluation:  Nutrition Goals Re-Evaluation     Row Name 08/21/23 1406 09/21/23 1433           Goals   Current Weight  222 lb 10.6 oz (101 kg) 222 lb 10.6 oz (101 kg)      Comment lipids WNL, LDL <55 (45), Lpa WNL, A1c WNL no new labs; most recent labs lipids WNL, LDL <55 (45), Lpa WNL, A1c WNL      Expected Outcome Bladimir has medical history of CABGx1, HTN, hyperlipidemia. Has started making some changes including reduced sodium and reduced processed meats. His LDL is at goal <55. Patient will benefit from participation in intensive cardiac rehab for nutrition, exercise, and lifestyle modification. Goals in action. Jawon has medical history of CABGx1, HTN, hyperlipidemia. Has started making some changes including reduced sodium, reduced processed meats, and cooking more at home. His LDL is at goal <55. He has maintained his weight since starting with our program. Patient will continue to benefit from participation in intensive cardiac rehab for nutrition, exercise, and lifestyle modification.               Nutrition Goals Re-Evaluation:  Nutrition Goals Re-Evaluation     Row Name 08/21/23 1406 09/21/23 1433           Goals   Current Weight 222 lb 10.6 oz (101 kg) 222 lb 10.6 oz (101 kg)      Comment lipids WNL, LDL <55 (45), Lpa WNL, A1c WNL no new labs; most recent labs lipids WNL, LDL <55 (45), Lpa WNL, A1c WNL      Expected Outcome Kalmen has medical history of CABGx1, HTN,  hyperlipidemia. Has started making some changes including reduced sodium and reduced processed meats. His LDL is at goal <55. Patient will benefit from participation in intensive cardiac rehab for nutrition, exercise, and lifestyle modification. Goals in action. Myrick has medical history of CABGx1, HTN, hyperlipidemia. Has started making some changes including reduced sodium, reduced processed meats, and cooking more at home. His LDL is at goal <55. He has maintained his weight since starting with our program. Patient will continue to benefit from participation in intensive cardiac rehab for nutrition, exercise, and lifestyle modification.               Nutrition Goals Discharge (Final Nutrition Goals Re-Evaluation):  Nutrition Goals Re-Evaluation - 09/21/23 1433       Goals   Current Weight 222 lb 10.6 oz (101 kg)    Comment no new labs; most recent labs lipids WNL, LDL <55 (45), Lpa WNL, A1c WNL    Expected Outcome Goals in action. Kevron has medical history of CABGx1, HTN, hyperlipidemia. Has started making some changes including reduced sodium, reduced processed meats, and cooking more at home. His LDL is at goal <55. He has maintained his weight since starting with our program. Patient will continue to benefit from participation in intensive cardiac rehab for nutrition, exercise, and lifestyle modification.             Psychosocial: Target Goals: Acknowledge presence or absence of significant depression and/or stress, maximize coping skills, provide positive support system. Participant is able to verbalize types and ability to use techniques and skills needed for reducing stress and depression.  Initial Review & Psychosocial Screening:  Initial Psych Review & Screening - 08/15/23 1426       Initial Review   Current issues with None Identified      Family Dynamics   Good Support System? Yes   Daughter for support   Comments Isaac [name] denies any feelings of anxiety/stress/depression.  He said he feels worried occasionally due to having other blockages that they couldn't fix with  previous CABG but he plans to discuss this with his cardiologist in April. 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      Barriers   Psychosocial barriers to participate in program There are no identifiable barriers or psychosocial needs.      Screening Interventions   Interventions Encouraged to exercise;Provide feedback about the scores to participant    Expected Outcomes Short Term goal: Identification and review with participant of any Quality of Life or Depression concerns found by scoring the questionnaire.;Long Term goal: The participant improves quality of Life and PHQ9 Scores as seen by post scores and/or verbalization of changes             Quality of Life Scores:  Quality of Life - 08/15/23 1444       Quality of Life   Select Quality of Life      Quality of Life Scores   Health/Function Pre 29.2 %    Socioeconomic Pre 30 %    Psych/Spiritual Pre 30 %    Family Pre 28.13 %    GLOBAL Pre 29.41 %            Scores of 19 and below usually indicate a poorer quality of life in these areas.  A difference of  2-3 points is a clinically meaningful difference.  A difference of 2-3 points in the total score of the Quality of Life Index has been associated with significant improvement in overall quality of life, self-image, physical symptoms, and general health in studies assessing change in quality of life.  PHQ-9: Review Flowsheet       08/15/2023  Depression screen PHQ 2/9  Decreased Interest 0  Down, Depressed, Hopeless 0  PHQ - 2 Score 0  Altered sleeping 0  Tired, decreased energy 0  Change in appetite 0  Feeling bad or failure about yourself  0  Trouble concentrating 0  Moving slowly or fidgety/restless 0  Suicidal thoughts 0  PHQ-9 Score 0   Interpretation of Total Score  Total Score Depression Severity:  1-4 = Minimal depression, 5-9 = Mild  depression, 10-14 = Moderate depression, 15-19 = Moderately severe depression, 20-27 = Severe depression   Psychosocial Evaluation and Intervention:   Psychosocial Re-Evaluation:  Psychosocial Re-Evaluation     Row Name 08/24/23 1549 08/24/23 1550 09/26/23 0828 10/24/23 1207       Psychosocial Re-Evaluation   Current issues with None Identified None Identified None Identified None Identified    Interventions Encouraged to attend Cardiac Rehabilitation for the exercise Encouraged to attend Cardiac Rehabilitation for the exercise Encouraged to attend Cardiac Rehabilitation for the exercise Encouraged to attend Cardiac Rehabilitation for the exercise    Continue Psychosocial Services  No Follow up required No Follow up required No Follow up required No Follow up required             Psychosocial Discharge (Final Psychosocial Re-Evaluation):  Psychosocial Re-Evaluation - 10/24/23 1207       Psychosocial Re-Evaluation   Current issues with None Identified    Interventions Encouraged to attend Cardiac Rehabilitation for the exercise    Continue Psychosocial Services  No Follow up required             Vocational Rehabilitation: Provide vocational rehab assistance to qualifying candidates.   Vocational Rehab Evaluation & Intervention:  Vocational Rehab - 08/15/23 1430       Initial Vocational Rehab Evaluation & Intervention   Assessment shows need for Vocational Rehabilitation No  Isaac [name] is retired            Education: Education Goals: Education classes will be provided on a weekly basis, covering required topics. Participant will state understanding/return demonstration of topics presented.    Education     Row Name 08/20/23 1600     Education   Cardiac Education Topics Pritikin   Glass blower/designer Nutrition   Nutrition Workshop Targeting Your Nutrition Priorities   Instruction Review Code 1- Verbalizes  Understanding   Class Start Time 1400   Class Stop Time 1440   Class Time Calculation (min) 40 min    Row Name 08/22/23 1500     Education   Cardiac Education Topics Pritikin   Customer service manager   Weekly Topic One-Pot Wonders   Instruction Review Code 1- Verbalizes Understanding   Class Start Time 1400   Class Stop Time 1445   Class Time Calculation (min) 45 min    Row Name 08/24/23 1300     Education   Cardiac Education Topics Pritikin   Hospital doctor Education   General Education Hypertension and Heart Disease   Instruction Review Code 1- Verbalizes Understanding   Class Start Time 1405   Class Stop Time 1455   Class Time Calculation (min) 50 min    Row Name 08/27/23 1600     Education   Cardiac Education Topics Pritikin   Geographical information systems officer Psychosocial   Psychosocial Workshop Focused Goals, Sustainable Changes   Instruction Review Code 1- Verbalizes Understanding   Class Start Time 1345   Class Stop Time 1443   Class Time Calculation (min) 58 min    Row Name 08/29/23 1600     Education   Cardiac Education Topics Pritikin   Customer service manager   Weekly Topic Comforting Weekend Breakfasts   Instruction Review Code 1- Verbalizes Understanding   Class Start Time 1400   Class Stop Time 1440   Class Time Calculation (min) 40 min    Row Name 09/03/23 1400     Education   Cardiac Education Topics Pritikin   Psychologist, forensic Exercise Education   Exercise Education Biomechanial Limitations   Instruction Review Code 1- Verbalizes Understanding   Class Start Time 1400   Class Stop Time 1440   Class Time Calculation (min) 40 min    Row Name 09/05/23 1600     Education   Cardiac  Education Topics Pritikin   Customer service manager   Weekly Topic Fast Evening Meals   Instruction Review Code 1- Verbalizes Understanding   Class Start Time 1400   Class Stop Time 1440   Class Time Calculation (min) 40 min    Row Name 09/07/23 1400     Education   Cardiac Education Topics Pritikin   Nurse, children's   Educator Dietitian   Select Nutrition   Nutrition Vitamins and Minerals   Instruction Review Code 1- Verbalizes Understanding   Class Start Time 1400   Class Stop Time  1440   Class Time Calculation (min) 40 min    Row Name 09/10/23 1500     Education   Cardiac Education Topics Pritikin   Glass blower/designer Nutrition   Nutrition Workshop Fueling a Forensic psychologist   Instruction Review Code 1- Tax inspector   Class Start Time 1400   Class Stop Time 1440   Class Time Calculation (min) 40 min    Row Name 09/12/23 1500     Education   Cardiac Education Topics Pritikin   Customer service manager   Weekly Topic International Cuisine- Spotlight on the [employer] Zones   Instruction Review Code 1- Verbalizes Understanding   Class Start Time 1400   Class Stop Time 1440   Class Time Calculation (min) 40 min    Row Name 09/14/23 1400     Education   Cardiac Education Topics Pritikin   Psychologist, forensic Exercise Education   Exercise Education Improving Performance   Instruction Review Code 1- Verbalizes Understanding   Class Start Time 1400   Class Stop Time 1435   Class Time Calculation (min) 35 min    Row Name 09/17/23 1600     Education   Cardiac Education Topics Pritikin   Geographical information systems officer Psychosocial   Psychosocial Workshop Healthy Sleep for a Healthy Heart   Instruction Review Code  1- Verbalizes Understanding   Class Start Time 1403   Class Stop Time 1457   Class Time Calculation (min) 54 min    Row Name 09/19/23 1400     Education   Cardiac Education Topics Pritikin   Customer service manager   Weekly Topic Simple Sides and Sauces   Instruction Review Code 1- Verbalizes Understanding   Class Start Time 1355   Class Stop Time 1431   Class Time Calculation (min) 36 min    Row Name 09/21/23 1500     Education   Cardiac Education Topics Pritikin   Nurse, children's Exercise Physiologist   Select Psychosocial   Psychosocial How Our Thoughts Can Heal Our Hearts   Instruction Review Code 1- Verbalizes Understanding   Class Start Time 1400   Class Stop Time 1440   Class Time Calculation (min) 40 min    Row Name 09/24/23 1500     Education   Cardiac Education Topics Pritikin   Select Workshops     Workshops   Educator Exercise Physiologist   Select Exercise   Exercise Workshop Managing Heart Disease: Your Path to a Healthier Heart   Instruction Review Code 1- Verbalizes Understanding   Class Start Time 1400   Class Stop Time 1448   Class Time Calculation (min) 48 min    Row Name 09/26/23 1500     Education   Cardiac Education Topics Pritikin   Orthoptist   Educator Dietitian   Weekly Topic Powerhouse Plant-Based Proteins   Instruction Review Code 1- Verbalizes Understanding   Class Start Time 1400   Class Stop Time 1435   Class Time Calculation (min) 35 min    Row Name 09/28/23 1400  Education   Cardiac Education Topics Pritikin   Hospital doctor Education   General Education Hypertension and Heart Disease   Instruction Review Code 1- Verbalizes Understanding   Class Start Time 1350   Class Stop Time 1433   Class Time Calculation (min) 43 min    Row Name  10/01/23 1400     Education   Cardiac Education Topics Pritikin   Geographical information systems officer Psychosocial   Psychosocial Workshop From Head to Heart: The Power of a Healthy Outlook   Instruction Review Code 1- Verbalizes Understanding   Class Start Time 1400   Class Stop Time 1449   Class Time Calculation (min) 49 min    Row Name 10/03/23 1600     Education   Cardiac Education Topics Pritikin   Customer service manager   Weekly Topic Tasty Appetizers and Snacks   Instruction Review Code 1- Verbalizes Understanding   Class Start Time 1400   Class Stop Time 1445   Class Time Calculation (min) 45 min    Row Name 10/05/23 1400     Education   Cardiac Education Topics Pritikin   Hospital doctor Education   General Education Heart Disease Risk Reduction   Instruction Review Code 1- Verbalizes Understanding   Class Start Time 1400   Class Stop Time 1445   Class Time Calculation (min) 45 min    Row Name 10/08/23 1500     Education   Cardiac Education Topics Pritikin   Nurse, children's Exercise Physiologist   Select Psychosocial   Psychosocial Healthy Minds, Bodies, Hearts   Instruction Review Code 1- Verbalizes Understanding   Class Start Time 1410   Class Stop Time 1445   Class Time Calculation (min) 35 min    Row Name 10/10/23 1500     Education   Cardiac Education Topics Pritikin   Customer service manager   Weekly Topic Adding Flavor - Sodium-Free   Instruction Review Code 1- Verbalizes Understanding   Class Start Time 1400   Class Stop Time 1440   Class Time Calculation (min) 40 min    Row Name 10/12/23 1600     Education   Cardiac Education Topics Pritikin   Select Workshops     Workshops   Educator Exercise Physiologist    Select Exercise   Exercise Workshop Location manager and Fall Prevention   Instruction Review Code 1- Verbalizes Understanding   Class Start Time 1407   Class Stop Time 1447   Class Time Calculation (min) 40 min    Row Name 10/15/23 1500     Education   Cardiac Education Topics Pritikin   Glass blower/designer Nutrition   Nutrition Workshop Label Reading   Instruction Review Code 1- Tax inspector   Class Start Time 1400   Class Stop Time 1437   Class Time Calculation (min) 37 min    Row Name 10/17/23 1400     Education   Cardiac Education Topics Pritikin   Psychologist, sport and exercise     Core  Videos   Educator Exercise Physiologist   Select Nutrition   Nutrition Other  Label reading   Instruction Review Code 1- Verbalizes Understanding   Class Start Time 1350   Class Stop Time 1435   Class Time Calculation (min) 45 min    Row Name 10/19/23 1500     Education   Cardiac Education Topics Pritikin   Customer service manager   Weekly Topic Fast and Healthy Breakfasts   Instruction Review Code 1- Verbalizes Understanding   Class Start Time 1400   Class Stop Time 1440   Class Time Calculation (min) 40 min            Core Videos: Exercise    Move It!  Clinical staff conducted group or individual video education with verbal and written material and guidebook.  Patient learns the recommended Pritikin exercise program. Exercise with the goal of living a long, healthy life. Some of the health benefits of exercise include controlled diabetes, healthier blood pressure levels, improved cholesterol levels, improved heart and lung capacity, improved sleep, and better body composition. Everyone should speak with their doctor before starting or changing an exercise routine.  Biomechanical Limitations Clinical staff conducted group or individual video education with verbal and written material  and guidebook.  Patient learns how biomechanical limitations can impact exercise and how we can mitigate and possibly overcome limitations to have an impactful and balanced exercise routine.  Body Composition Clinical staff conducted group or individual video education with verbal and written material and guidebook.  Patient learns that body composition (ratio of muscle mass to fat mass) is a key component to assessing overall fitness, rather than body weight alone. Increased fat mass, especially visceral belly fat, can put us  at increased risk for metabolic syndrome, type 2 diabetes, heart disease, and even death. It is recommended to combine diet and exercise (cardiovascular and resistance training) to improve your body composition. Seek guidance from your physician and exercise physiologist before implementing an exercise routine.  Exercise Action Plan Clinical staff conducted group or individual video education with verbal and written material and guidebook.  Patient learns the recommended strategies to achieve and enjoy long-term exercise adherence, including variety, self-motivation, self-efficacy, and positive decision making. Benefits of exercise include fitness, good health, weight management, more energy, better sleep, less stress, and overall well-being.  Medical   Heart Disease Risk Reduction Clinical staff conducted group or individual video education with verbal and written material and guidebook.  Patient learns our heart is our most vital organ as it circulates oxygen, nutrients, white blood cells, and hormones throughout the entire body, and carries waste away. Data supports a plant-based eating plan like the Pritikin Program for its effectiveness in slowing progression of and reversing heart disease. The video provides a number of recommendations to address heart disease.   Metabolic Syndrome and Belly Fat  Clinical staff conducted group or individual video education with verbal  and written material and guidebook.  Patient learns what metabolic syndrome is, how it leads to heart disease, and how one can reverse it and keep it from coming back. You have metabolic syndrome if you have 3 of the following 5 criteria: abdominal obesity, high blood pressure, high triglycerides, low HDL cholesterol, and high blood sugar.  Hypertension and Heart Disease Clinical staff conducted group or individual video education with verbal and written material and guidebook.  Patient learns that high blood pressure, or hypertension, is very common  in the [place] . Hypertension is largely due to excessive salt intake, but other important risk factors include being overweight, physical inactivity, drinking too much alcohol , smoking, and not eating enough potassium from fruits and vegetables. High blood pressure is a leading risk factor for heart attack, stroke, congestive heart failure, dementia, kidney failure, and premature death. Long-term effects of excessive salt intake include stiffening of the arteries and thickening of heart muscle and organ damage. Recommendations include ways to reduce hypertension and the risk of heart disease.  Diseases of Our Time - Focusing on Diabetes Clinical staff conducted group or individual video education with verbal and written material and guidebook.  Patient learns why the best way to stop diseases of our time is prevention, through food and other lifestyle changes. Medicine (such as prescription pills and surgeries) is often only a Band-Aid on the problem, not a long-term solution. Most common diseases of our time include obesity, type 2 diabetes, hypertension, heart disease, and cancer. The Pritikin Program is recommended and has been proven to help reduce, reverse, and/or prevent the damaging effects of metabolic syndrome.  Nutrition   Overview of the Pritikin Eating Plan  Clinical staff conducted group or individual video education with verbal and  written material and guidebook.  Patient learns about the Pritikin Eating Plan for disease risk reduction. The Pritikin Eating Plan emphasizes a wide variety of unrefined, minimally-processed carbohydrates, like fruits, vegetables, whole grains, and legumes. Go, Caution, and Stop food choices are explained. Plant-based and lean animal proteins are emphasized. Rationale provided for low sodium intake for blood pressure control, low added sugars for blood sugar stabilization, and low added fats and oils for coronary artery disease risk reduction and weight management.  Calorie Density  Clinical staff conducted group or individual video education with verbal and written material and guidebook.  Patient learns about calorie density and how it impacts the Pritikin Eating Plan. Knowing the characteristics of the food you choose will help you decide whether those foods will lead to weight gain or weight loss, and whether you want to consume more or less of them. Weight loss is usually a side effect of the Pritikin Eating Plan because of its focus on low calorie-dense foods.  Label Reading  Clinical staff conducted group or individual video education with verbal and written material and guidebook.  Patient learns about the Pritikin recommended label reading guidelines and corresponding recommendations regarding calorie density, added sugars, sodium content, and whole grains.  Dining Out - Part 1  Clinical staff conducted group or individual video education with verbal and written material and guidebook.  Patient learns that restaurant meals can be sabotaging because they can be so high in calories, fat, sodium, and/or sugar. Patient learns recommended strategies on how to positively address this and avoid unhealthy pitfalls.  Facts on Fats  Clinical staff conducted group or individual video education with verbal and written material and guidebook.  Patient learns that lifestyle modifications can be just as  effective, if not more so, as many medications for lowering your risk of heart disease. A Pritikin lifestyle can help to reduce your risk of inflammation and atherosclerosis (cholesterol build-up, or plaque, in the artery walls). Lifestyle interventions such as dietary choices and physical activity address the cause of atherosclerosis. A review of the types of fats and their impact on blood cholesterol levels, along with dietary recommendations to reduce fat intake is also included.  Nutrition Action Plan  Clinical staff conducted group or individual video education with  verbal and written material and guidebook.  Patient learns how to incorporate Pritikin recommendations into their lifestyle. Recommendations include planning and keeping personal health goals in mind as an important part of their success.  Healthy Mind-Set    Healthy Minds, Bodies, Hearts  Clinical staff conducted group or individual video education with verbal and written material and guidebook.  Patient learns how to identify when they are stressed. Video will discuss the impact of that stress, as well as the many benefits of stress management. Patient will also be introduced to stress management techniques. The way we think, act, and feel has an impact on our hearts.  How Our Thoughts Can Heal Our Hearts  Clinical staff conducted group or individual video education with verbal and written material and guidebook.  Patient learns that negative thoughts can cause depression and anxiety. This can result in negative lifestyle behavior and serious health problems. Cognitive behavioral therapy is an effective method to help control our thoughts in order to change and improve our emotional outlook.  Additional Videos:  Exercise    Improving Performance  Clinical staff conducted group or individual video education with verbal and written material and guidebook.  Patient learns to use a non-linear approach by alternating intensity  levels and lengths of time spent exercising to help burn more calories and lose more body fat. Cardiovascular exercise helps improve heart health, metabolism, hormonal balance, blood sugar control, and recovery from fatigue. Resistance training improves strength, endurance, balance, coordination, reaction time, metabolism, and muscle mass. Flexibility exercise improves circulation, posture, and balance. Seek guidance from your physician and exercise physiologist before implementing an exercise routine and learn your capabilities and proper form for all exercise.  Introduction to Yoga  Clinical staff conducted group or individual video education with verbal and written material and guidebook.  Patient learns about yoga, a discipline of the coming together of mind, breath, and body. The benefits of yoga include improved flexibility, improved range of motion, better posture and core strength, increased lung function, weight loss, and positive self-image. Yoga's heart health benefits include lowered blood pressure, healthier heart rate, decreased cholesterol and triglyceride levels, improved immune function, and reduced stress. Seek guidance from your physician and exercise physiologist before implementing an exercise routine and learn your capabilities and proper form for all exercise.  Medical   Aging: Enhancing Your Quality of Life  Clinical staff conducted group or individual video education with verbal and written material and guidebook.  Patient learns key strategies and recommendations to stay in good physical health and enhance quality of life, such as prevention strategies, having an advocate, securing a Health Care Proxy and Power of Attorney, and keeping a list of medications and system for tracking them. It also discusses how to avoid risk for bone loss.  Biology of Weight Control  Clinical staff conducted group or individual video education with verbal and written material and guidebook.   Patient learns that weight gain occurs because we consume more calories than we burn (eating more, moving less). Even if your body weight is normal, you may have higher ratios of fat compared to muscle mass. Too much body fat puts you at increased risk for cardiovascular disease, heart attack, stroke, type 2 diabetes, and obesity-related cancers. In addition to exercise, following the Pritikin Eating Plan can help reduce your risk.  Decoding Lab Results  Clinical staff conducted group or individual video education with verbal and written material and guidebook.  Patient learns that lab test reflects one measurement whose  values change over time and are influenced by many factors, including medication, stress, sleep, exercise, food, hydration, pre-existing medical conditions, and more. It is recommended to use the knowledge from this video to become more involved with your lab results and evaluate your numbers to speak with your doctor.   Diseases of Our Time - Overview  Clinical staff conducted group or individual video education with verbal and written material and guidebook.  Patient learns that according to the CDC, 50% to 70% of chronic diseases (such as obesity, type 2 diabetes, elevated lipids, hypertension, and heart disease) are avoidable through lifestyle improvements including healthier food choices, listening to satiety cues, and increased physical activity.  Sleep Disorders Clinical staff conducted group or individual video education with verbal and written material and guidebook.  Patient learns how good quality and duration of sleep are important to overall health and well-being. Patient also learns about sleep disorders and how they impact health along with recommendations to address them, including discussing with a physician.  Nutrition  Dining Out - Part 2 Clinical staff conducted group or individual video education with verbal and written material and guidebook.  Patient learns  how to plan ahead and communicate in order to maximize their dining experience in a healthy and nutritious manner. Included are recommended food choices based on the type of restaurant the patient is visiting.   Fueling a Banker conducted group or individual video education with verbal and written material and guidebook.  There is a strong connection between our food choices and our health. Diseases like obesity and type 2 diabetes are very prevalent and are in large-part due to lifestyle choices. The Pritikin Eating Plan provides plenty of food and hunger-curbing satisfaction. It is easy to follow, affordable, and helps reduce health risks.  Menu Workshop  Clinical staff conducted group or individual video education with verbal and written material and guidebook.  Patient learns that restaurant meals can sabotage health goals because they are often packed with calories, fat, sodium, and sugar. Recommendations include strategies to plan ahead and to communicate with the manager, chef, or server to help order a healthier meal.  Planning Your Eating Strategy  Clinical staff conducted group or individual video education with verbal and written material and guidebook.  Patient learns about the Pritikin Eating Plan and its benefit of reducing the risk of disease. The Pritikin Eating Plan does not focus on calories. Instead, it emphasizes high-quality, nutrient-rich foods. By knowing the characteristics of the foods, we choose, we can determine their calorie density and make informed decisions.  Targeting Your Nutrition Priorities  Clinical staff conducted group or individual video education with verbal and written material and guidebook.  Patient learns that lifestyle habits have a tremendous impact on disease risk and progression. This video provides eating and physical activity recommendations based on your personal health goals, such as reducing LDL cholesterol, losing weight,  preventing or controlling type 2 diabetes, and reducing high blood pressure.  Vitamins and Minerals  Clinical staff conducted group or individual video education with verbal and written material and guidebook.  Patient learns different ways to obtain key vitamins and minerals, including through a recommended healthy diet. It is important to discuss all supplements you take with your doctor.   Healthy Mind-Set    Smoking Cessation  Clinical staff conducted group or individual video education with verbal and written material and guidebook.  Patient learns that cigarette smoking and tobacco addiction pose a serious health risk which  affects millions of people. Stopping smoking will significantly reduce the risk of heart disease, lung disease, and many forms of cancer. Recommended strategies for quitting are covered, including working with your doctor to develop a successful plan.  Culinary   Becoming a Set designer conducted group or individual video education with verbal and written material and guidebook.  Patient learns that cooking at home can be healthy, cost-effective, quick, and puts them in control. Keys to cooking healthy recipes will include looking at your recipe, assessing your equipment needs, planning ahead, making it simple, choosing cost-effective seasonal ingredients, and limiting the use of added fats, salts, and sugars.  Cooking - Breakfast and Snacks  Clinical staff conducted group or individual video education with verbal and written material and guidebook.  Patient learns how important breakfast is to satiety and nutrition through the entire day. Recommendations include key foods to eat during breakfast to help stabilize blood sugar levels and to prevent overeating at meals later in the day. Planning ahead is also a key component.  Cooking - Educational psychologist conducted group or individual video education with verbal and written material and  guidebook.  Patient learns eating strategies to improve overall health, including an approach to cook more at home. Recommendations include thinking of animal protein as a side on your plate rather than center stage and focusing instead on lower calorie dense options like vegetables, fruits, whole grains, and plant-based proteins, such as beans. Making sauces in large quantities to freeze for later and leaving the skin on your vegetables are also recommended to maximize your experience.  Cooking - Healthy Salads and Dressing Clinical staff conducted group or individual video education with verbal and written material and guidebook.  Patient learns that vegetables, fruits, whole grains, and legumes are the foundations of the Pritikin Eating Plan. Recommendations include how to incorporate each of these in flavorful and healthy salads, and how to create homemade salad dressings. Proper handling of ingredients is also covered. Cooking - Soups and [employer] - Soups and Desserts Clinical staff conducted group or individual video education with verbal and written material and guidebook.  Patient learns that Pritikin soups and desserts make for easy, nutritious, and delicious snacks and meal components that are low in sodium, fat, sugar, and calorie density, while high in vitamins, minerals, and filling fiber. Recommendations include simple and healthy ideas for soups and desserts.   Overview     The Pritikin Solution Program Overview Clinical staff conducted group or individual video education with verbal and written material and guidebook.  Patient learns that the results of the Pritikin Program have been documented in more than 100 articles published in peer-reviewed journals, and the benefits include reducing risk factors for (and, in some cases, even reversing) high cholesterol, high blood pressure, type 2 diabetes, obesity, and more! An overview of the three key pillars of the Pritikin Program  will be covered: eating well, doing regular exercise, and having a healthy mind-set.  WORKSHOPS  Exercise: Exercise Basics: Building Your Action Plan Clinical staff led group instruction and group discussion with PowerPoint presentation and patient guidebook. To enhance the learning environment the use of posters, models and videos may be added. At the conclusion of this workshop, patients will comprehend the difference between physical activity and exercise, as well as the benefits of incorporating both, into their routine. Patients will understand the FITT (Frequency, Intensity, Time, and Type) principle and how to use it to build  an exercise action plan. In addition, safety concerns and other considerations for exercise and cardiac rehab will be addressed by the presenter. The purpose of this lesson is to promote a comprehensive and effective weekly exercise routine in order to improve patients' overall level of fitness.   Managing Heart Disease: Your Path to a Healthier Heart Clinical staff led group instruction and group discussion with PowerPoint presentation and patient guidebook. To enhance the learning environment the use of posters, models and videos may be added.At the conclusion of this workshop, patients will understand the anatomy and physiology of the heart. Additionally, they will understand how Pritikin's three pillars impact the risk factors, the progression, and the management of heart disease.  The purpose of this lesson is to provide a high-level overview of the heart, heart disease, and how the Pritikin lifestyle positively impacts risk factors.  Exercise Biomechanics Clinical staff led group instruction and group discussion with PowerPoint presentation and patient guidebook. To enhance the learning environment the use of posters, models and videos may be added. Patients will learn how the structural parts of their bodies function and how these functions impact their daily  activities, movement, and exercise. Patients will learn how to promote a neutral spine, learn how to manage pain, and identify ways to improve their physical movement in order to promote healthy living. The purpose of this lesson is to expose patients to common physical limitations that impact physical activity. Participants will learn practical ways to adapt and manage aches and pains, and to minimize their effect on regular exercise. Patients will learn how to maintain good posture while sitting, walking, and lifting.  Balance Training and Fall Prevention  Clinical staff led group instruction and group discussion with PowerPoint presentation and patient guidebook. To enhance the learning environment the use of posters, models and videos may be added. At the conclusion of this workshop, patients will understand the importance of their sensorimotor skills (vision, proprioception, and the vestibular system) in maintaining their ability to balance as they age. Patients will apply a variety of balancing exercises that are appropriate for their current level of function. Patients will understand the common causes for poor balance, possible solutions to these problems, and ways to modify their physical environment in order to minimize their fall risk. The purpose of this lesson is to teach patients about the importance of maintaining balance as they age and ways to minimize their risk of falling.  WORKSHOPS   Nutrition:  Fueling a Ship broker led group instruction and group discussion with PowerPoint presentation and patient guidebook. To enhance the learning environment the use of posters, models and videos may be added. Patients will review the foundational principles of the Pritikin Eating Plan and understand what constitutes a serving size in each of the food groups. Patients will also learn Pritikin-friendly foods that are better choices when away from home and review make-ahead  meal and snack options. Calorie density will be reviewed and applied to three nutrition priorities: weight maintenance, weight loss, and weight gain. The purpose of this lesson is to reinforce (in a group setting) the key concepts around what patients are recommended to eat and how to apply these guidelines when away from home by planning and selecting Pritikin-friendly options. Patients will understand how calorie density may be adjusted for different weight management goals.  Mindful Eating  Clinical staff led group instruction and group discussion with PowerPoint presentation and patient guidebook. To enhance the learning environment the use of posters, models  and videos may be added. Patients will briefly review the concepts of the Pritikin Eating Plan and the importance of low-calorie dense foods. The concept of mindful eating will be introduced as well as the importance of paying attention to internal hunger signals. Triggers for non-hunger eating and techniques for dealing with triggers will be explored. The purpose of this lesson is to provide patients with the opportunity to review the basic principles of the Pritikin Eating Plan, discuss the value of eating mindfully and how to measure internal cues of hunger and fullness using the Hunger Scale. Patients will also discuss reasons for non-hunger eating and learn strategies to use for controlling emotional eating.  Targeting Your Nutrition Priorities Clinical staff led group instruction and group discussion with PowerPoint presentation and patient guidebook. To enhance the learning environment the use of posters, models and videos may be added. Patients will learn how to determine their genetic susceptibility to disease by reviewing their family history. Patients will gain insight into the importance of diet as part of an overall healthy lifestyle in mitigating the impact of genetics and other environmental insults. The purpose of this lesson is to  provide patients with the opportunity to assess their personal nutrition priorities by looking at their family history, their own health history and current risk factors. Patients will also be able to discuss ways of prioritizing and modifying the Pritikin Eating Plan for their highest risk areas  Menu  Clinical staff led group instruction and group discussion with PowerPoint presentation and patient guidebook. To enhance the learning environment the use of posters, models and videos may be added. Using menus brought in from [employer], or printed from [employer], patients will apply the Pritikin dining out guidelines that were presented in the [employer] video. Patients will also be able to practice these guidelines in a variety of provided scenarios. The purpose of this lesson is to provide patients with the opportunity to practice hands-on learning of the Pritikin Dining Out guidelines with actual menus and practice scenarios.  Label Reading Clinical staff led group instruction and group discussion with PowerPoint presentation and patient guidebook. To enhance the learning environment the use of posters, models and videos may be added. Patients will review and discuss the Pritikin label reading guidelines presented in Pritikin's Label Reading Educational series video. Using fool labels brought in from local grocery stores and markets, patients will apply the label reading guidelines and determine if the packaged food meet the Pritikin guidelines. The purpose of this lesson is to provide patients with the opportunity to review, discuss, and practice hands-on learning of the Pritikin Label Reading guidelines with actual packaged food labels. Cooking School  Pritikin's [employer] are designed to teach patients ways to prepare quick, simple, and affordable recipes at home. The importance of nutrition's role in chronic disease risk reduction is reflected in its  emphasis in the overall Pritikin program. By learning how to prepare essential core Pritikin Eating Plan recipes, patients will increase control over what they eat; be able to customize the flavor of foods without the use of added salt, sugar, or fat; and improve the quality of the food they consume. By learning a set of core recipes which are easily assembled, quickly prepared, and affordable, patients are more likely to prepare more healthy foods at home. These workshops focus on convenient breakfasts, simple entres, side dishes, and desserts which can be prepared with minimal effort and are consistent with nutrition recommendations for cardiovascular risk  reduction. Cooking [employer] are taught by a Armed forces logistics/support/administrative officer (RD) who has been trained by the [employer]. The chef or RD has a clear understanding of the importance of minimizing - if not completely eliminating - added fat, sugar, and sodium in recipes. Throughout the series of Cooking School Workshop sessions, patients will learn about healthy ingredients and efficient methods of cooking to build confidence in their capability to prepare    Cooking School weekly topics:  Adding Flavor- Sodium-Free  Fast and Healthy Breakfasts  Powerhouse Plant-Based Proteins  Satisfying Salads and Dressings  Simple Sides and Sauces  International Cuisine-Spotlight on the [employer] Zones  Delicious Desserts  Savory Soups  [employer] - Meals in a Astronomer Appetizers and Snacks  Comforting Weekend Breakfasts  One-Pot Wonders   Fast Evening Meals  Landscape architect Your Pritikin Plate  WORKSHOPS   Healthy Mindset (Psychosocial):  Focused Goals, Sustainable Changes Clinical staff led group instruction and group discussion with PowerPoint presentation and patient guidebook. To enhance the learning environment the use of posters, models and videos may be added. Patients will be able to apply effective  goal setting strategies to establish at least one personal goal, and then take consistent, meaningful action toward that goal. They will learn to identify common barriers to achieving personal goals and develop strategies to overcome them. Patients will also gain an understanding of how our mind-set can impact our ability to achieve goals and the importance of cultivating a positive and growth-oriented mind-set. The purpose of this lesson is to provide patients with a deeper understanding of how to set and achieve personal goals, as well as the tools and strategies needed to overcome common obstacles which may arise along the way.  From Head to Heart: The Power of a Healthy Outlook  Clinical staff led group instruction and group discussion with PowerPoint presentation and patient guidebook. To enhance the learning environment the use of posters, models and videos may be added. Patients will be able to recognize and describe the impact of emotions and mood on physical health. They will discover the importance of self-care and explore self-care practices which may work for them. Patients will also learn how to utilize the 4 C's to cultivate a healthier outlook and better manage stress and challenges. The purpose of this lesson is to demonstrate to patients how a healthy outlook is an essential part of maintaining good health, especially as they continue their cardiac rehab journey.  Healthy Sleep for a Healthy Heart Clinical staff led group instruction and group discussion with PowerPoint presentation and patient guidebook. To enhance the learning environment the use of posters, models and videos may be added. At the conclusion of this workshop, patients will be able to demonstrate knowledge of the importance of sleep to overall health, well-being, and quality of life. They will understand the symptoms of, and treatments for, common sleep disorders. Patients will also be able to identify daytime and nighttime  behaviors which impact sleep, and they will be able to apply these tools to help manage sleep-related challenges. The purpose of this lesson is to provide patients with a general overview of sleep and outline the importance of quality sleep. Patients will learn about a few of the most common sleep disorders. Patients will also be introduced to the concept of "sleep hygiene," and discover ways to self-manage certain sleeping problems through simple daily behavior changes. Finally, the workshop will motivate patients by clarifying the links between  quality sleep and their goals of heart-healthy living.   Recognizing and Reducing Stress Clinical staff led group instruction and group discussion with PowerPoint presentation and patient guidebook. To enhance the learning environment the use of posters, models and videos may be added. At the conclusion of this workshop, patients will be able to understand the types of stress reactions, differentiate between acute and chronic stress, and recognize the impact that chronic stress has on their health. They will also be able to apply different coping mechanisms, such as reframing negative self-talk. Patients will have the opportunity to practice a variety of stress management techniques, such as deep abdominal breathing, progressive muscle relaxation, and/or guided imagery.  The purpose of this lesson is to educate patients on the role of stress in their lives and to provide healthy techniques for coping with it.  Learning Barriers/Preferences:  Learning Barriers/Preferences - 08/15/23 1430       Learning Barriers/Preferences   Learning Barriers None    Learning Preferences Audio;Group Instruction;Individual Instruction;Pictoral;Skilled Demonstration;Verbal Instruction;Written Material;Video             Education Topics:  Knowledge Questionnaire Score:  Knowledge Questionnaire Score - 08/15/23 1431       Knowledge Questionnaire Score   Pre Score 22/24              Core Components/Risk Factors/Patient Goals at Admission:  Personal Goals and Risk Factors at Admission - 08/15/23 1430       Core Components/Risk Factors/Patient Goals on Admission    Weight Management Yes;Obesity    Intervention Weight Management: Provide education and appropriate resources to help participant work on and attain dietary goals.;Weight Management: Develop a combined nutrition and exercise program designed to reach desired caloric intake, while maintaining appropriate intake of nutrient and fiber, sodium and fats, and appropriate energy expenditure required for the weight goal.;Weight Management/Obesity: Establish reasonable short term and long term weight goals.;Obesity: Provide education and appropriate resources to help participant work on and attain dietary goals.    Expected Outcomes Short Term: Continue to assess and modify interventions until short term weight is achieved;Long Term: Adherence to nutrition and physical activity/exercise program aimed toward attainment of established weight goal;Understanding recommendations for meals to include 15-35% energy as protein, 25-35% energy from fat, 35-60% energy from carbohydrates, less than 200mg  of dietary cholesterol, 20-35 gm of total fiber daily;Understanding of distribution of calorie intake throughout the day with the consumption of 4-5 meals/snacks    Hypertension Yes    Intervention Provide education on lifestyle modifcations including regular physical activity/exercise, weight management, moderate sodium restriction and increased consumption of fresh fruit, vegetables, and low fat dairy, alcohol  moderation, and smoking cessation.;Monitor prescription use compliance.    Expected Outcomes Short Term: Continued assessment and intervention until BP is < 140/73mm HG in hypertensive participants. < 130/55mm HG in hypertensive participants with diabetes, heart failure or chronic kidney disease.;Long Term: Maintenance  of blood pressure at goal levels.    Lipids Yes    Intervention Provide education and support for participant on nutrition & aerobic/resistive exercise along with prescribed medications to achieve LDL 70mg , HDL >40mg .    Expected Outcomes Short Term: Participant states understanding of desired cholesterol values and is compliant with medications prescribed. Participant is following exercise prescription and nutrition guidelines.;Long Term: Cholesterol controlled with medications as prescribed, with individualized exercise RX and with personalized nutrition plan. Value goals: LDL < 70mg , HDL > 40 mg.             Core Components/Risk  Factors/Patient Goals Review:   Goals and Risk Factor Review     Row Name 08/24/23 1552 09/26/23 0830 10/24/23 1208         Core Components/Risk Factors/Patient Goals Review   Personal Goals Review Weight Management/Obesity;Hypertension;Lipids Weight Management/Obesity;Hypertension;Lipids Weight Management/Obesity;Hypertension;Lipids     Review Isaac [name] is off to a good start to exercise. Vital signs have been stable. Isaac [name] is doing well with exercise. Vital signs have been stable. Isaac [name] has been increasing his workloads Isaac [name] continues to do well with exercise at cardiac rehab.. Vital signs have been stable. Isaac [name] has been increasing his workloads. Isaac [name] has lsot 1 kg since starting cardiac rehab.     Expected Outcomes Isaac [name] will continue to participate in cardiac rehab for exercise, nutriiton and lifestyle modifications Isaac [name] will continue to participate in cardiac rehab for exercise, nutriiton and lifestyle modifications Isaac [name] will continue to participate in cardiac rehab for exercise, nutriiton and lifestyle modifications              Core Components/Risk Factors/Patient Goals at Discharge (Final Review):   Goals and Risk Factor Review - 10/24/23 1208       Core Components/Risk Factors/Patient Goals Review   Personal Goals Review Weight  Management/Obesity;Hypertension;Lipids    Review Isaac [name] continues to do well with exercise at cardiac rehab.. Vital signs have been stable. Isaac [name] has been increasing his workloads. Isaac [name] has lsot 1 kg since starting cardiac rehab.    Expected Outcomes Isaac [name] will continue to participate in cardiac rehab for exercise, nutriiton and lifestyle modifications             ITP Comments:  ITP Comments     Row Name 08/15/23 1423 08/24/23 1533 08/28/23 1023 09/26/23 0827 10/24/23 1207   ITP Comments Dr. Gaylyn Keas medical director. Introduction to pritikin education/intensive cardiac rehab. Initial orientation packet reviewed with patient. 30 Day ITP Review. Isaac [name] started cardiac rehab on 08/20/23. Isaac [name] did well with exercise. 30 Day ITP Review. Isaac [name] started cardiac rehab on 08/20/23. Isaac [name] is off to a good start  with exercise. 30 Day ITP Review. Isaac [name] has good attendance and participaiton in cardiac rehab 30 Day ITP Review. Isaac [name] continues to have  good attendance and participaiton  with exercise at cardiac rehab            Comments: See ITP comments.Monte Antonio RN BSN

## 2023-10-26 ENCOUNTER — Encounter (HOSPITAL_COMMUNITY)
Admission: RE | Admit: 2023-10-26 | Discharge: 2023-10-26 | Disposition: A | Discharge status: Home / Self Care | Source: Ambulatory Visit | Attending: Internal Medicine | Admitting: Internal Medicine

## 2023-10-26 DIAGNOSIS — Z951 Presence of aortocoronary bypass graft: Secondary | ICD-10-CM

## 2023-10-31 ENCOUNTER — Encounter (HOSPITAL_COMMUNITY)
Admission: RE | Admit: 2023-10-31 | Discharge: 2023-10-31 | Disposition: A | Discharge status: Home / Self Care | Source: Ambulatory Visit | Attending: Internal Medicine | Admitting: Internal Medicine

## 2023-10-31 DIAGNOSIS — Z951 Presence of aortocoronary bypass graft: Secondary | ICD-10-CM | POA: Diagnosis not present

## 2023-11-01 ENCOUNTER — Encounter (HOSPITAL_BASED_OUTPATIENT_CLINIC_OR_DEPARTMENT_OTHER): Payer: Self-pay

## 2023-11-02 ENCOUNTER — Encounter (HOSPITAL_BASED_OUTPATIENT_CLINIC_OR_DEPARTMENT_OTHER): Payer: Self-pay

## 2023-11-02 ENCOUNTER — Encounter (HOSPITAL_COMMUNITY)
Admission: RE | Admit: 2023-11-02 | Discharge: 2023-11-02 | Disposition: A | Discharge status: Home / Self Care | Source: Ambulatory Visit | Attending: Internal Medicine | Admitting: Internal Medicine

## 2023-11-02 DIAGNOSIS — Z951 Presence of aortocoronary bypass graft: Secondary | ICD-10-CM

## 2023-11-05 ENCOUNTER — Encounter (HOSPITAL_COMMUNITY)
Admission: RE | Admit: 2023-11-05 | Discharge: 2023-11-05 | Disposition: A | Discharge status: Home / Self Care | Source: Ambulatory Visit | Attending: Internal Medicine | Admitting: Internal Medicine

## 2023-11-05 DIAGNOSIS — Z951 Presence of aortocoronary bypass graft: Secondary | ICD-10-CM | POA: Insufficient documentation

## 2023-11-05 DIAGNOSIS — Z48812 Encounter for surgical aftercare following surgery on the circulatory system: Secondary | ICD-10-CM | POA: Insufficient documentation

## 2023-11-07 ENCOUNTER — Encounter (HOSPITAL_COMMUNITY)
Admission: RE | Admit: 2023-11-07 | Discharge: 2023-11-07 | Disposition: A | Discharge status: Home / Self Care | Source: Ambulatory Visit | Attending: Internal Medicine | Admitting: Internal Medicine

## 2023-11-07 DIAGNOSIS — Z951 Presence of aortocoronary bypass graft: Secondary | ICD-10-CM

## 2023-11-07 DIAGNOSIS — Z48812 Encounter for surgical aftercare following surgery on the circulatory system: Secondary | ICD-10-CM | POA: Diagnosis not present

## 2023-11-08 DIAGNOSIS — E785 Hyperlipidemia, unspecified: Secondary | ICD-10-CM | POA: Diagnosis not present

## 2023-11-08 DIAGNOSIS — I2510 Atherosclerotic heart disease of native coronary artery without angina pectoris: Secondary | ICD-10-CM | POA: Diagnosis not present

## 2023-11-08 LAB — CBC
Hematocrit: 42 % (ref 37.5–51.0)
Hemoglobin: 14.1 g/dL (ref 13.0–17.7)
MCH: 30.1 pg (ref 26.6–33.0)
MCHC: 33.6 g/dL (ref 31.5–35.7)
MCV: 90 fL (ref 79–97)
Platelets: 245 10*3/uL (ref 150–450)
RBC: 4.69 x10E6/uL (ref 4.14–5.80)
RDW: 13.5 % (ref 11.6–15.4)
WBC: 6.1 10*3/uL (ref 3.4–10.8)

## 2023-11-09 ENCOUNTER — Encounter (HOSPITAL_COMMUNITY)
Admission: RE | Admit: 2023-11-09 | Discharge: 2023-11-09 | Disposition: A | Discharge status: Home / Self Care | Source: Ambulatory Visit | Attending: Internal Medicine | Admitting: Internal Medicine

## 2023-11-09 ENCOUNTER — Ambulatory Visit: Payer: Self-pay

## 2023-11-09 DIAGNOSIS — Z951 Presence of aortocoronary bypass graft: Secondary | ICD-10-CM

## 2023-11-09 DIAGNOSIS — Z48812 Encounter for surgical aftercare following surgery on the circulatory system: Secondary | ICD-10-CM | POA: Diagnosis not present

## 2023-11-12 ENCOUNTER — Encounter (HOSPITAL_COMMUNITY)
Admission: RE | Admit: 2023-11-12 | Discharge: 2023-11-12 | Disposition: A | Discharge status: Home / Self Care | Source: Ambulatory Visit | Attending: Internal Medicine

## 2023-11-12 DIAGNOSIS — Z48812 Encounter for surgical aftercare following surgery on the circulatory system: Secondary | ICD-10-CM | POA: Diagnosis not present

## 2023-11-12 DIAGNOSIS — Z951 Presence of aortocoronary bypass graft: Secondary | ICD-10-CM

## 2023-11-14 ENCOUNTER — Encounter (HOSPITAL_COMMUNITY)
Admission: RE | Admit: 2023-11-14 | Discharge: 2023-11-14 | Disposition: A | Discharge status: Home / Self Care | Source: Ambulatory Visit | Attending: Internal Medicine | Admitting: Internal Medicine

## 2023-11-14 VITALS — Ht 68.0 in | Wt 224.6 lb

## 2023-11-14 DIAGNOSIS — Z951 Presence of aortocoronary bypass graft: Secondary | ICD-10-CM

## 2023-11-14 DIAGNOSIS — Z48812 Encounter for surgical aftercare following surgery on the circulatory system: Secondary | ICD-10-CM | POA: Diagnosis not present

## 2023-11-14 NOTE — Progress Notes (Signed)
 Discharge Progress Report  Patient Details  Name: Isaac [name] MRN: [id number] Date of Birth: 1945/06/18 Referring Provider:   Flowsheet Row INTENSIVE CARDIAC REHAB ORIENT from 08/15/2023 in [facility]  Referring Provider Stanly Blind, MD     Number of Visits: 50  Reason for Discharge:  Patient reached a stable level of exercise. Patient independent in their exercise. Patient has met program and personal goals.  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/23 CABG x 1  ADL [facility]SD:   Initial Exercise Prescription:  Initial Exercise Prescription - 08/15/23 1500       Date of Initial Exercise RX and Referring Provider   Date 08/15/23    Referring Provider Stanly Blind, MD    Expected Discharge Date 11/07/23      Recumbant Bike   Level 1    RPM 50    Watts 30    Minutes 15    METs 2      NuStep   Level 1    SPM 70    Minutes 15    METs 2      Prescription Details   Frequency (times per week) 3    Duration Progress to 30 minutes of continuous aerobic without signs/symptoms of physical distress      Intensity   THRR 40-80% of Max Heartrate 57-117    Ratings of Perceived Exertion 11-13    Perceived Dyspnea 0-4      Progression   Progression Continue progressive overload as per policy without signs/symptoms or physical distress.      Resistance Training   Training Prescription Yes    Weight 3    Reps 10-15          Discharge Exercise Prescription (Final Exercise Prescription Changes):  Exercise Prescription Changes - 11/16/23 1652       Response to Exercise   Blood Pressure (Admit) 138/70    Blood Pressure (Exit) 122/70    Heart Rate (Admit) 75 bpm    Heart Rate (Exercise) 104 bpm    Heart Rate (Exit)  84 bpm    Rating of Perceived Exertion (Exercise) 13.5    Perceived Dyspnea (Exercise) 0    Symptoms 0    Comments Pt graduated the [facility] ICR program    Duration Progress to 30 minutes of  aerobic without signs/symptoms of physical distress    Intensity THRR unchanged      Progression   Progression Continue to progress workloads to maintain intensity without signs/symptoms of physical distress.    Average METs 3.55      Resistance Training   Training Prescription Yes    Weight 3    Reps 10-15    Time 10 Minutes      NuStep   Level 5    SPM 104    Minutes 15    METs 2.7      Elliptical   Level 1    Speed 1    Minutes 15    METs 4.4      Home Exercise Plan   Plans to continue exercise at [facility] (comment)    Frequency Add 2 additional days to program exercise sessions.    Initial Home Exercises Provided 09/19/23  Functional Capacity:  6 Minute Walk     Row Name 08/15/23 1539 11/14/23 1634       6 Minute Walk   Phase Initial Discharge    Distance 1560 feet 1709 feet    Distance % Change -- 9.55 %    Distance Feet Change -- 149 ft    Walk Time 6 minutes 6 minutes    # of Rest Breaks 0 0    MPH 2.95 3.24    METS 2.5 3.11    RPE 12 14    Perceived Dyspnea  0 0    VO2 Peak 8.77 10.87    Symptoms No No    Resting HR 72 bpm 74 bpm    Resting BP 114/62 126/60    Resting Oxygen Saturation  97 % --    Exercise Oxygen Saturation  during 6 min walk 97 % --    Max Ex. HR 90 bpm 108 bpm    Max Ex. BP 138/70 160/58    2 Minute Post BP 118/68 118/74       Psychological, QOL, Others - Outcomes: PHQ 2/9:    11/14/2023    4:19 PM 08/15/2023    2:25 PM  Depression screen PHQ 2/9  Decreased Interest 0 0  Down, Depressed, Hopeless 0 0  PHQ - 2 Score 0 0  Altered sleeping 0 0  Tired, decreased energy 0 0  Change in appetite 0 0  Feeling bad or failure about yourself  0 0  Trouble concentrating 0 0  Moving slowly or fidgety/restless 0 0   Suicidal thoughts 0 0  PHQ-9 Score 0 0  Difficult doing work/chores Not difficult at all     Quality of Life:  Quality of Life - 11/14/23 1412       Quality of Life   Select Quality of Life      Quality of Life Scores   Health/Function Post 28.5 %    Socioeconomic Post 28.31 %    Psych/Spiritual Post 30 %    Family Post 28.8 %    GLOBAL Post 28.8 %          Personal Goals: Goals established at orientation with interventions provided to work toward goal.  Personal Goals and Risk Factors at Admission - 08/15/23 1430       Core Components/Risk Factors/Patient Goals on Admission    Weight Management Yes;Obesity    Intervention Weight Management: Provide education and appropriate resources to help participant work on and attain dietary goals.;Weight Management: Develop a combined nutrition and exercise program designed to reach desired caloric intake, while maintaining appropriate intake of nutrient and fiber, sodium and fats, and appropriate energy expenditure required for the weight goal.;Weight Management/Obesity: Establish reasonable short term and long term weight goals.;Obesity: Provide education and appropriate resources to help participant work on and attain dietary goals.    Expected Outcomes Short Term: Continue to assess and modify interventions until short term weight is achieved;Long Term: Adherence to nutrition and physical activity/exercise program aimed toward attainment of established weight goal;Understanding recommendations for meals to include 15-35% energy as protein, 25-35% energy from fat, 35-60% energy from carbohydrates, less than 200mg  of dietary cholesterol, 20-35 gm of total fiber daily;Understanding of distribution of calorie intake throughout the day with the consumption of 4-5 meals/snacks    Hypertension Yes    Intervention Provide education on lifestyle modifcations including regular physical activity/exercise, weight management, moderate sodium  restriction and increased consumption of fresh fruit, vegetables, and low  fat dairy, alcohol  moderation, and smoking cessation.;Monitor prescription use compliance.    Expected Outcomes Short Term: Continued assessment and intervention until BP is < 140/57mm HG in hypertensive participants. < 130/62mm HG in hypertensive participants with diabetes, heart failure or chronic kidney disease.;Long Term: Maintenance of blood pressure at goal levels.    Lipids Yes    Intervention Provide education and support for participant on nutrition & aerobic/resistive exercise along with prescribed medications to achieve LDL 70mg , HDL >40mg .    Expected Outcomes Short Term: Participant states understanding of desired cholesterol values and is compliant with medications prescribed. Participant is following exercise prescription and nutrition guidelines.;Long Term: Cholesterol controlled with medications as prescribed, with individualized exercise RX and with personalized nutrition plan. Value goals: LDL < 70mg , HDL > 40 mg.           Personal Goals Discharge:  Goals and Risk Factor Review     Row Name 08/24/23 1552 09/26/23 0830 10/24/23 1208         Core Components/Risk Factors/Patient Goals Review   Personal Goals Review Weight Management/Obesity;Hypertension;Lipids Weight Management/Obesity;Hypertension;Lipids Weight Management/Obesity;Hypertension;Lipids     Review Isaac [name] is off to a good start to exercise. Vital signs have been stable. Isaac [name] is doing well with exercise. Vital signs have been stable. Isaac [name] has been increasing his workloads Isaac [name] continues to do well with exercise at cardiac rehab.. Vital signs have been stable. Isaac [name] has been increasing his workloads. Isaac [name] has lsot 1 kg since starting cardiac rehab.     Expected Outcomes Isaac [name] will continue to participate in cardiac rehab for exercise, nutriiton and lifestyle modifications Isaac [name] will continue to participate in cardiac rehab for exercise, nutriiton and  lifestyle modifications Isaac [name] will continue to participate in cardiac rehab for exercise, nutriiton and lifestyle modifications        Exercise Goals and Review:  Exercise Goals     Row Name 08/15/23 1426             Exercise Goals   Increase Physical Activity Yes       Intervention Provide advice, education, support and counseling about physical activity/exercise needs.;Develop an individualized exercise prescription for aerobic and resistive training based on initial evaluation findings, risk stratification, comorbidities and participant's personal goals.       Expected Outcomes Short Term: Attend rehab on a regular basis to increase amount of physical activity.;Long Term: Exercising regularly at least 3-5 days a week.;Long Term: Add in home exercise to make exercise part of routine and to increase amount of physical activity.       Increase Strength and Stamina Yes       Intervention Provide advice, education, support and counseling about physical activity/exercise needs.;Develop an individualized exercise prescription for aerobic and resistive training based on initial evaluation findings, risk stratification, comorbidities and participant's personal goals.       Expected Outcomes Short Term: Increase workloads from initial exercise prescription for resistance, speed, and METs.;Short Term: Perform resistance training exercises routinely during rehab and add in resistance training at home;Long Term: Improve cardiorespiratory fitness, muscular endurance and strength as measured by increased METs and functional capacity ( )       Able to understand and use rate of perceived exertion (RPE) scale Yes       Intervention Provide education and explanation on how to use RPE scale       Expected Outcomes Short Term: Able to use RPE daily in rehab to express subjective intensity level;Long Term:  Able to use RPE to  guide intensity level when exercising independently       Knowledge and understanding  of Target Heart Rate Range (THRR) Yes       Intervention Provide education and explanation of THRR including how the numbers were predicted and where they are located for reference       Expected Outcomes Short Term: Able to state/look up THRR;Short Term: Able to use daily as guideline for intensity in rehab;Long Term: Able to use THRR to govern intensity when exercising independently       Understanding of Exercise Prescription Yes       Intervention Provide education, explanation, and written materials on patient's individual exercise prescription       Expected Outcomes Short Term: Able to explain program exercise prescription;Long Term: Able to explain home exercise prescription to exercise independently          Exercise Goals Re-Evaluation:  Exercise Goals Re-Evaluation     Row Name 08/20/23 1637 09/05/23 1637 10/19/23 1641 11/16/23 1653       Exercise Goal Re-Evaluation   Exercise Goals Review Increase Physical Activity;Understanding of Exercise Prescription;Increase Strength and Stamina;Knowledge and understanding of Target Heart Rate Range (THRR);Able to understand and use rate of perceived exertion (RPE) scale Increase Physical Activity;Understanding of Exercise Prescription;Increase Strength and Stamina;Knowledge and understanding of Target Heart Rate Range (THRR);Able to understand and use rate of perceived exertion (RPE) scale Increase Physical Activity;Understanding of Exercise Prescription;Increase Strength and Stamina;Knowledge and understanding of Target Heart Rate Range (THRR);Able to understand and use rate of perceived exertion (RPE) scale Increase Physical Activity;Understanding of Exercise Prescription;Increase Strength and Stamina;Knowledge and understanding of Target Heart Rate Range (THRR);Able to understand and use rate of perceived exertion (RPE) scale    Comments Pt first day in the [facility] program. Pt toleraetd exercise well with an average MET level of 2.35. Pt is  leanring his THRR, RPE and ExRx. He's off to a great start Reviewed MET's and goals. Pt toleraetd exercise well with an average MET level of 3.1. Pt is feeling good about his goals and is increasing strength and stamina. Reviewed MET's and goals. Pt toleraetd exercise well with an average MET level of 3.8. Pt is doing well and feels good with exercise, HR is doing better on theElliptical but he feels good with his level, so no changes yet. He feels good about his goals and is ready to start going back to [facility] on his own, will give him a sheet for weight lifing progression, but he is past his restriction timeframe, well add in weight lifting gradually Pt graduuated the [facility] ICR program. Pt toleraetd exercise well with an average MET level of 3.55. Pt did very well in the program and enjoyed his time here. He increased on his post by 168ft for a total of 1766ft. He will continue to exercise by bowling, weights and going to the [facility] 3-4 days for 30-60 mins    Expected Outcomes Will continue to monitor pt and progress workloads as tolerated without sign or symptom Will continue to monitor pt and progress workloads as tolerated without sign or symptom Will continue to monitor pt and progress workloads as tolerated without sign or symptom Will continue to monitor pt and progress workloads as tolerated without sign or symptom       Nutrition & Weight - Outcomes:  Pre Biometrics - 08/15/23 1424       Pre Biometrics   Waist Circumference 44 inches    Hip Circumference 45 inches  Waist to Hip Ratio 0.98 %    Triceps Skinfold 18 mm    % Body Fat 32.8 %    Grip Strength 32 kg    Flexibility 0 in   could not reach   Single Leg Stand 4.56 seconds          Post Biometrics - 11/14/23 1636        Post  Biometrics   Height 5' 8 (1.727 m)    Weight 101.9 kg    Waist Circumference 43.5 inches    Hip Circumference 46 inches    Waist to Hip Ratio 0.95 %    BMI (Calculated) 34.17    Triceps  Skinfold 12 mm    % Body Fat 31 %    Grip Strength 32 kg    Single Leg Stand 4.5 seconds          Nutrition:  Nutrition Therapy & Goals - 11/16/23 0833       Nutrition Therapy   Diet Heart healthy diet    Drug/Food Interactions Statins/Certain Fruits      Personal Nutrition Goals   Nutrition Goal Patient to identify strategies for reducing cardiovascular risk by attending the [facility] education and nutrition series weekly.   goal in action.   Personal Goal #2 Patient to improve diet quality by using the plate method as a guide for meal planning to include lean protein/plant protein, fruits, vegetables, whole grains, nonfat dairy as part of a well-balanced diet.   goal in action.   Comments Goals in action. Isaac [name] has medical history of CABGx1, HTN, hyperlipidemia. He has attended the [facility] education/nutrition series regularly. Has started making some changes including reduced sodium, reduced processed meats, increased dietary fiber, and cooking more at home. His LDL is at goal <55. Patient will continue to benefit from adherence to nutrition, exercise, and lifestyle modification.      Intervention Plan   Intervention Prescribe, educate and counsel regarding individualized specific dietary modifications aiming towards targeted core components such as weight, hypertension, lipid management, diabetes, heart failure and other comorbidities.;Nutrition handout(s) given to patient.    Expected Outcomes Short Term Goal: Understand basic principles of dietary content, such as calories, fat, sodium, cholesterol and nutrients.;Long Term Goal: Adherence to prescribed nutrition plan.          Nutrition Discharge:  Nutrition Assessments - 11/14/23 1414       Rate Your Plate Scores   Post Score 71          Education Questionnaire Score:  Knowledge Questionnaire Score - 11/14/23 1409       Knowledge Questionnaire Score   Post Score 19/24          Goals reviewed with patient; copy  given to patient.Pt graduates from  Intensive/Traditional cardiac rehab program on 11/14/23  with completion of  70 exercise and education sessions. Pt maintained good attendance and progressed nicely during their participation in rehab as evidenced by increased MET level. Isaac [name] increased his distance on his post exercise walk test by 149 feet.   Medication list reconciled. Repeat  PHQ score- 0 .  Pt has made significant lifestyle changes and should be commended for their success. Isaac [name]  achieved their goals during cardiac rehab.   Pt plans to continue exercise at the [facility]. We are proud of Isaac [name]'s progress! Hadassah Elpidio Quan RN BSN

## 2023-11-16 ENCOUNTER — Encounter (HOSPITAL_COMMUNITY)
Admission: RE | Admit: 2023-11-16 | Discharge: 2023-11-16 | Disposition: A | Discharge status: Home / Self Care | Source: Ambulatory Visit | Attending: Internal Medicine | Admitting: Internal Medicine

## 2023-11-16 DIAGNOSIS — Z951 Presence of aortocoronary bypass graft: Secondary | ICD-10-CM

## 2023-11-16 DIAGNOSIS — Z48812 Encounter for surgical aftercare following surgery on the circulatory system: Secondary | ICD-10-CM | POA: Diagnosis not present

## 2023-11-29 DIAGNOSIS — L821 Other seborrheic keratosis: Secondary | ICD-10-CM | POA: Diagnosis not present

## 2023-11-29 DIAGNOSIS — L570 Actinic keratosis: Secondary | ICD-10-CM | POA: Diagnosis not present

## 2023-11-29 DIAGNOSIS — R208 Other disturbances of skin sensation: Secondary | ICD-10-CM | POA: Diagnosis not present

## 2023-11-29 DIAGNOSIS — D1801 Hemangioma of skin and subcutaneous tissue: Secondary | ICD-10-CM | POA: Diagnosis not present

## 2023-11-29 DIAGNOSIS — B351 Tinea unguium: Secondary | ICD-10-CM | POA: Diagnosis not present

## 2023-12-25 ENCOUNTER — Ambulatory Visit (HOSPITAL_BASED_OUTPATIENT_CLINIC_OR_DEPARTMENT_OTHER): Admitting: Family Medicine

## 2024-01-03 DIAGNOSIS — E785 Hyperlipidemia, unspecified: Secondary | ICD-10-CM | POA: Diagnosis not present

## 2024-01-23 ENCOUNTER — Ambulatory Visit (HOSPITAL_BASED_OUTPATIENT_CLINIC_OR_DEPARTMENT_OTHER): Admitting: Family Medicine

## 2024-02-03 DIAGNOSIS — E785 Hyperlipidemia, unspecified: Secondary | ICD-10-CM | POA: Diagnosis not present

## 2024-02-13 DIAGNOSIS — M19012 Primary osteoarthritis, left shoulder: Secondary | ICD-10-CM | POA: Diagnosis not present

## 2024-02-22 DIAGNOSIS — R051 Acute cough: Secondary | ICD-10-CM | POA: Diagnosis not present

## 2024-02-22 DIAGNOSIS — U071 COVID-19: Secondary | ICD-10-CM | POA: Diagnosis not present

## 2024-02-25 DIAGNOSIS — M25561 Pain in right knee: Secondary | ICD-10-CM | POA: Diagnosis not present

## 2024-03-04 DIAGNOSIS — E785 Hyperlipidemia, unspecified: Secondary | ICD-10-CM | POA: Diagnosis not present

## 2024-03-05 DIAGNOSIS — R7309 Other abnormal glucose: Secondary | ICD-10-CM | POA: Diagnosis not present

## 2024-03-05 DIAGNOSIS — Z23 Encounter for immunization: Secondary | ICD-10-CM | POA: Diagnosis not present

## 2024-03-05 DIAGNOSIS — E785 Hyperlipidemia, unspecified: Secondary | ICD-10-CM | POA: Diagnosis not present

## 2024-03-05 DIAGNOSIS — Z0000 Encounter for general adult medical examination without abnormal findings: Secondary | ICD-10-CM | POA: Diagnosis not present

## 2024-03-05 DIAGNOSIS — Z951 Presence of aortocoronary bypass graft: Secondary | ICD-10-CM | POA: Diagnosis not present

## 2024-04-04 DIAGNOSIS — E785 Hyperlipidemia, unspecified: Secondary | ICD-10-CM | POA: Diagnosis not present

## 2024-05-04 DIAGNOSIS — E785 Hyperlipidemia, unspecified: Secondary | ICD-10-CM | POA: Diagnosis not present

## 2024-05-14 ENCOUNTER — Encounter: Payer: Self-pay | Admitting: Internal Medicine
